# Patient Record
Sex: Male | Born: 1960 | Race: White | Hispanic: No | Marital: Married | Smoking: Former smoker
Health system: Southern US, Community
[De-identification: ages and names within clinical notes are randomized; demographics above are authoritative.]

## PROBLEM LIST (undated history)

## (undated) DIAGNOSIS — E785 Hyperlipidemia, unspecified: Secondary | ICD-10-CM

## (undated) DIAGNOSIS — I25810 Atherosclerosis of coronary artery bypass graft(s) without angina pectoris: Secondary | ICD-10-CM

## (undated) DIAGNOSIS — T7840XA Allergy, unspecified, initial encounter: Secondary | ICD-10-CM

## (undated) DIAGNOSIS — I351 Nonrheumatic aortic (valve) insufficiency: Secondary | ICD-10-CM

## (undated) DIAGNOSIS — I10 Essential (primary) hypertension: Secondary | ICD-10-CM

## (undated) DIAGNOSIS — K219 Gastro-esophageal reflux disease without esophagitis: Secondary | ICD-10-CM

## (undated) HISTORY — DX: Hyperlipidemia, unspecified: E78.5

## (undated) HISTORY — DX: Gastro-esophageal reflux disease without esophagitis: K21.9

## (undated) HISTORY — PX: COLONOSCOPY: SHX174

## (undated) HISTORY — DX: Nonrheumatic aortic (valve) insufficiency: I35.1

## (undated) HISTORY — DX: Atherosclerosis of coronary artery bypass graft(s) without angina pectoris: I25.810

## (undated) HISTORY — PX: HERNIA REPAIR: SHX51

## (undated) HISTORY — PX: TONSILLECTOMY: SUR1361

## (undated) HISTORY — DX: Allergy, unspecified, initial encounter: T78.40XA

---

## 2002-10-12 ENCOUNTER — Encounter: Payer: Self-pay | Admitting: Emergency Medicine

## 2002-10-12 ENCOUNTER — Emergency Department (HOSPITAL_COMMUNITY): Admission: AD | Admit: 2002-10-12 | Discharge: 2002-10-12 | Payer: Self-pay | Admitting: Emergency Medicine

## 2010-03-21 ENCOUNTER — Inpatient Hospital Stay (HOSPITAL_COMMUNITY)
Admission: EM | Admit: 2010-03-21 | Discharge: 2010-03-22 | Payer: Self-pay | Source: Home / Self Care | Attending: Internal Medicine | Admitting: Internal Medicine

## 2010-03-25 LAB — CK TOTAL AND CKMB (NOT AT ARMC)
CK, MB: 1 ng/mL (ref 0.3–4.0)
CK, MB: 0.9 ng/mL (ref 0.3–4.0)
Relative Index: INVALID (ref 0.0–2.5)
Relative Index: INVALID (ref 0.0–2.5)
Total CK: 63 U/L (ref 7–232)
Total CK: 55 U/L (ref 7–232)

## 2010-03-25 LAB — CARDIAC PANEL(CRET KIN+CKTOT+MB+TROPI)
CK, MB: 0.9 ng/mL (ref 0.3–4.0)
CK, MB: 0.7 ng/mL (ref 0.3–4.0)
Relative Index: INVALID (ref 0.0–2.5)
Relative Index: INVALID (ref 0.0–2.5)
Total CK: 54 U/L (ref 7–232)
Total CK: 54 U/L (ref 7–232)
Troponin I: 0.01 ng/mL (ref 0.00–0.06)
Troponin I: 0.02 ng/mL (ref 0.00–0.06)

## 2010-03-25 LAB — LIPID PANEL
Cholesterol: 218 mg/dL — ABNORMAL HIGH (ref 0–200)
HDL: 43 mg/dL (ref >39)
LDL Cholesterol: 146 mg/dL — ABNORMAL HIGH (ref 0–99)
Total CHOL/HDL Ratio: 5.1 RATIO
Triglycerides: 146 mg/dL (ref <150)
VLDL: 29 mg/dL (ref 0–40)

## 2010-03-25 LAB — POCT CARDIAC MARKERS
CKMB, poc: <1 ng/mL — ABNORMAL LOW (ref 1.0–8.0)
CKMB, poc: <1 ng/mL — ABNORMAL LOW (ref 1.0–8.0)
CKMB, poc: <1 ng/mL — ABNORMAL LOW (ref 1.0–8.0)
Myoglobin, poc: 43.9 ng/mL (ref 12–200)
Myoglobin, poc: 41.9 ng/mL (ref 12–200)
Myoglobin, poc: 41.8 ng/mL (ref 12–200)
Troponin i, poc: <0.05 ng/mL (ref 0.00–0.09)
Troponin i, poc: <0.05 ng/mL (ref 0.00–0.09)
Troponin i, poc: <0.05 ng/mL (ref 0.00–0.09)

## 2010-03-25 LAB — URINALYSIS, ROUTINE W REFLEX MICROSCOPIC
Bilirubin Urine: NEGATIVE
Hgb urine dipstick: NEGATIVE
Ketones, ur: NEGATIVE mg/dL
Nitrite: NEGATIVE
Protein, ur: NEGATIVE mg/dL
Specific Gravity, Urine: 1.022 (ref 1.005–1.030)
Urine Glucose, Fasting: NEGATIVE mg/dL
Urobilinogen, UA: 0.2 mg/dL (ref 0.0–1.0)
pH: 6 (ref 5.0–8.0)

## 2010-03-25 LAB — DIFFERENTIAL
Basophils Absolute: 0 10*3/uL (ref 0.0–0.1)
Basophils Relative: 1 % (ref 0–1)
Eosinophils Absolute: 0.3 10*3/uL (ref 0.0–0.7)
Eosinophils Relative: 4 % (ref 0–5)
Lymphocytes Relative: 24 % (ref 12–46)
Lymphs Abs: 1.8 10*3/uL (ref 0.7–4.0)
Monocytes Absolute: 0.5 10*3/uL (ref 0.1–1.0)
Monocytes Relative: 7 % (ref 3–12)
Neutro Abs: 4.9 10*3/uL (ref 1.7–7.7)
Neutrophils Relative %: 65 % (ref 43–77)

## 2010-03-25 LAB — COMPREHENSIVE METABOLIC PANEL
ALT: 19 U/L (ref 0–53)
AST: 19 U/L (ref 0–37)
Albumin: 4.1 g/dL (ref 3.5–5.2)
Alkaline Phosphatase: 70 U/L (ref 39–117)
BUN: 8 mg/dL (ref 6–23)
CO2: 27 mEq/L (ref 19–32)
Calcium: 9.3 mg/dL (ref 8.4–10.5)
Chloride: 107 mEq/L (ref 96–112)
Creatinine, Ser: 0.9 mg/dL (ref 0.4–1.5)
GFR calc Af Amer: >60 mL/min (ref >60)
GFR calc non Af Amer: >60 mL/min (ref >60)
Glucose, Bld: 94 mg/dL (ref 70–99)
Potassium: 4.2 mEq/L (ref 3.5–5.1)
Sodium: 141 mEq/L (ref 135–145)
Total Bilirubin: 0.5 mg/dL (ref 0.3–1.2)
Total Protein: 6.2 g/dL (ref 6.0–8.3)

## 2010-03-25 LAB — CBC
HCT: 43.6 % (ref 39.0–52.0)
HCT: 42.8 % (ref 39.0–52.0)
Hemoglobin: 15 g/dL (ref 13.0–17.0)
Hemoglobin: 14.3 g/dL (ref 13.0–17.0)
MCH: 30.5 pg (ref 26.0–34.0)
MCH: 30 pg (ref 26.0–34.0)
MCHC: 34.4 g/dL (ref 30.0–36.0)
MCHC: 33.4 g/dL (ref 30.0–36.0)
MCV: 88.6 fL (ref 78.0–100.0)
MCV: 89.7 fL (ref 78.0–100.0)
Platelets: 283 10*3/uL (ref 150–400)
Platelets: 319 10*3/uL (ref 150–400)
RBC: 4.92 MIL/uL (ref 4.22–5.81)
RBC: 4.77 MIL/uL (ref 4.22–5.81)
RDW: 13 % (ref 11.5–15.5)
RDW: 13.1 % (ref 11.5–15.5)
WBC: 7.6 10*3/uL (ref 4.0–10.5)
WBC: 7 10*3/uL (ref 4.0–10.5)

## 2010-03-25 LAB — HEPATIC FUNCTION PANEL
ALT: 20 U/L (ref 0–53)
AST: 19 U/L (ref 0–37)
Albumin: 3.8 g/dL (ref 3.5–5.2)
Alkaline Phosphatase: 63 U/L (ref 39–117)
Bilirubin, Direct: 0.2 mg/dL (ref 0.0–0.3)
Indirect Bilirubin: 1 mg/dL — ABNORMAL HIGH (ref 0.3–0.9)
Total Bilirubin: 1.2 mg/dL (ref 0.3–1.2)
Total Protein: 6 g/dL (ref 6.0–8.3)

## 2010-03-25 LAB — RAPID URINE DRUG SCREEN, HOSP PERFORMED
Amphetamines: NOT DETECTED
Barbiturates: NOT DETECTED
Benzodiazepines: NOT DETECTED
Cocaine: NOT DETECTED
Opiates: NOT DETECTED
Tetrahydrocannabinol: NOT DETECTED

## 2010-03-25 LAB — BASIC METABOLIC PANEL
BUN: 14 mg/dL (ref 6–23)
CO2: 25 mEq/L (ref 19–32)
Calcium: 8.9 mg/dL (ref 8.4–10.5)
Chloride: 104 mEq/L (ref 96–112)
Creatinine, Ser: 1.02 mg/dL (ref 0.4–1.5)
GFR calc Af Amer: >60 mL/min (ref >60)
GFR calc non Af Amer: >60 mL/min (ref >60)
Glucose, Bld: 110 mg/dL — ABNORMAL HIGH (ref 70–99)
Potassium: 4 mEq/L (ref 3.5–5.1)
Sodium: 137 mEq/L (ref 135–145)

## 2010-03-25 LAB — TROPONIN I: Troponin I: <0.01 ng/mL (ref 0.00–0.06)

## 2010-03-25 LAB — MAGNESIUM: Magnesium: 2.5 mg/dL (ref 1.5–2.5)

## 2010-03-25 LAB — TSH: TSH: 0.779 u[IU]/mL (ref 0.350–4.500)

## 2010-03-28 NOTE — Discharge Summary (Addendum)
  NAME:  Evan Rivera, Evan Rivera               ACCOUNT NO.:  [id number]  MEDICAL RECORD NO.:  [id number]          PATIENT TYPE:  INP  LOCATION:  4741                         FACILITY:  [facility]  PHYSICIAN:  Markies Mowatt I Anupama Piehl, MD      DATE OF BIRTH:  [birth year]  DATE OF ADMISSION:  03/21/2010 DATE OF DISCHARGE:  03/22/2010                              DISCHARGE SUMMARY   PRIMARY CARE PHYSICIAN:  Tomi Bamberger, MD  DISCHARGE DIAGNOSES: 1. Syncope felt to be possibility of orthostatic hypotension, this was     Niaspan side effect. 2. Flushing and erythema, which resolved felt to be secondary to     Niaspan. 3. Sinus bradycardia, there is no any alarming arrhythmia or monitor. 4. Hypotension, resolved. 5. Hypercholesteremia.  DISCHARGE MEDICATIONS:  Dose of Diovan will be decreased to 80 mg p.o. b.i.d. till he see his primary care physician and treatment of hypercholesteremia will be addressed by Dr. Tomi Bamberger.  HISTORY OF PRESENT ILLNESS:  Please see the admission history done by Dr. Ebony Cargo.  In summary, this is a 50 year old gentleman with no previous significant medical history other than hypertension and hypercholesteremia, for which the patient is taking Diovan and the patient was recently started on Niaspan for 3 days, on day 1 and day 2 went without event and day 3, the patient started to have flushing and erythema and suddenly felt dizzy, and he collapsed on the floor for 15 seconds.  As per wife, did not witness any seizure, and after the episode, the patient was awake, talking.  In the emergency room, the patient found to be hypotensive, responded to IV fluid.  The patient admitted for 24 hour close monitoring and observation for any pathology of cardiac event.  There were no evidence of neurological deficit to such as any stroke or any TIA.  The patient admitted to tele.  EKG was sinus rhythm.  Cardiac enzymes were cycled, which were negative, and 2-D echo pending currently.  We  will fax it to Dr. Tomi Bamberger when the report is ready.  I will contact the patient if there is any alarming echo.  The patient needs to follow up with Dr. Tomi Bamberger within 1 week, and the patient have carotid duplex done, which did not show any evidence of ICA stenosis.  Bradycardia felt to be secondary to the patient's physical activity.  As we mentioned, there is no any symptomatic bradycardia or heart rate drop as low as 50-48.     Stachia Slutsky Bosie Helper, MD     HIE/MEDQ  D:  03/22/2010  T:  03/23/2010  Job:  [id number]  cc:   Dr. Tomi Bamberger  Electronically Signed by Ebony Cargo MD on 03/28/2010 12:46:29 PM

## 2010-03-28 NOTE — H&P (Addendum)
NAME:  Evan Rivera, Evan Rivera               ACCOUNT NO.:  [id number]  MEDICAL RECORD NO.:  [id number]          PATIENT TYPE:  INP  LOCATION:  4741                         FACILITY:  [facility]  PHYSICIAN:  Jaley Yan I Koltan Portocarrero, MD      DATE OF BIRTH:  [birth year]  DATE OF ADMISSION:  03/21/2010 DATE OF DISCHARGE:                             HISTORY & PHYSICAL   PRIMARY CARE PHYSICIAN:  Candace Gallus. Ferd Glassing, MD  CHIEF COMPLAINT:  Syncope for 15 seconds.  HISTORY OF PRESENT ILLNESS:  This is a 50 year old Caucasian gentleman, retired Engineer, agricultural, who has a hypertension and hypercholesteremia.  The patient is taking Diovan for a long period of time without any complication.  He is on hypercholesteremia medications. The patient supposed to be on Niaspan.  Here on out of his other hypercholesteremic medication, he cannot recall what the name.  He start taking Niaspan for hypercholesteremia.  The patient was advised to take aspirin with Niaspan, but he did not.  First day, he has not complications and secondary without complication.  On the third day after he took the Niaspan, he awaken from sleep with flushing, skin burning all over, and he fell there is generalized erythema on his body. He went to the bathroom and he felt really dizzy.  He denies any shortness of breath.  He denies any palpitation and then he fell on the floor without any obvious injuries.  His wife who witnessed, the syncope lasted around 15 seconds and after that, the patient was wake without any neurological deficit.  He denies any complain of chest pain or shortness of breath.  Denies any nausea or vomiting.  Denies any abdominal pain.  When he was seen and evaluated by the emergency room, blood pressure was systolic 78.  He was treated with IV fluid and his blood pressure returned to 126 systolic.  In the emergency room, the patient have two cardiac set of enzyme were negative and we were asked for further evaluation.  PAST  MEDICAL HISTORY:  Significant for hypertension and hypercholesteremia.  ALLERGIES:  No known drug allergies.  MEDICATIONS:  Niaspan and Diovan and another hypercholesteremic medication, the patient run out of it.  FAMILY HISTORY:  History of hypertension, mother with a history of coronary artery disease, is status post CABG and hypercholesteremia.  SYSTEMIC REVIEW:  All 12-system were reviewed and there is no positive symptoms.  The patient denies any chest pain, any shortness of breath. Denies any neck pain.  The patient complained of headache.  Denies any nausea, vomiting or abdominal pain.  Denies hematemesis or hematuria. Denies any burning micturition.  The patient denies any lower extremity swelling.  Denies any numbness or weakness of his extremities.  Wife denies any seizure activities.  PHYSICAL EXAMINATION:  VITAL SIGNS:  Temperature 98.7.  At EMS, the initial blood pressure was 76 systolic, blood pressure currently 120/81, heart rate was 49 and currently heart rate around 60, pulse rate 82. HEENT:  Normocephalic and atraumatic.  Pupils are equal, reactive to light and accommodation.  Extraocular muscle movement within normal. NECK:  Supple.  No lymphadenopathy.  No bruits. HEART:  S1 and S2 with no added sounds. LUNGS:  Normal vesicular breathing with equal air entry. ABDOMEN:  Soft, nontender.  Bowel sounds positive. EXTREMITIES:  No lower limb edema.  Peripheral pulses intact. CNS:  The patient awake, alert and oriented x3 with no focal neurologic deficit.  LABORATORY STUDIES:  Blood workup, cardiac enzyme x2 was negative. Urinalysis negative.  Liver enzyme was normal.  BMET; sodium 137, potassium 4, chloride 104, CO2 25, glucose 110, BUN 14, creatinine 1.02 and calcium 8.9.  CBC 7.6, hemoglobin 15, hematocrit 43.6, platelets 283.  ASSESSMENT AND PLAN: 1. Syncope, possibility secondary to vasovagal syncope.  There is     allergy from Niaspan.  We have to tick what  the other medications,     it could be Simcor which is combination of simvastatin and niacin.     When he was admitted, he was hypotensive and he respond to IV     fluid.  The patient will be admitted to telemetry.  We will cycle     the patient's cardiac enzymes.  We will take the patient's     orthostatic blood pressure. 2. We will get 2-D echo and carotid duplex.  The patient did not feel     will need any MRI, there is any neurologic deficit.  The patient     was obviously hypotensive when he was admitted.  We will get total     CK and we will kept the patient in the 24-hour monitor for close     observation. 3. We will decrease the dose of Diovan.  If above is negative, the     patient will be discharge to follow up with his primary care     physician.     Maleny Candy Bosie Helper, MD     HIE/MEDQ  D:  03/21/2010  T:  03/21/2010  Job:  [id number]  cc:   Candace Gallus. Ferd Glassing, M.D.  Electronically Signed by Ebony Cargo MD on 03/28/2010 12:46:24 PM

## 2011-02-11 ENCOUNTER — Emergency Department: Payer: Self-pay | Admitting: Emergency Medicine

## 2011-08-27 ENCOUNTER — Ambulatory Visit: Payer: Self-pay | Admitting: Surgery

## 2011-08-27 LAB — CBC WITH DIFFERENTIAL/PLATELET
Basophil #: 0 10*3/uL (ref 0.0–0.1)
Basophil %: 0.5 %
Eosinophil #: 0.4 10*3/uL (ref 0.0–0.7)
Eosinophil %: 5.4 %
HCT: 42.5 % (ref 40.0–52.0)
HGB: 14.5 g/dL (ref 13.0–18.0)
Lymphocyte #: 1.2 10*3/uL (ref 1.0–3.6)
Lymphocyte %: 18.7 %
MCH: 30.5 pg (ref 26.0–34.0)
MCHC: 34.1 g/dL (ref 32.0–36.0)
MCV: 90 fL (ref 80–100)
Monocyte #: 0.5 x10 3/mm (ref 0.2–1.0)
Monocyte %: 7.4 %
Neutrophil #: 4.5 10*3/uL (ref 1.4–6.5)
Neutrophil %: 68 %
Platelet: 299 10*3/uL (ref 150–440)
RBC: 4.75 10*6/uL (ref 4.40–5.90)
RDW: 13.2 % (ref 11.5–14.5)
WBC: 6.6 10*3/uL (ref 3.8–10.6)

## 2011-08-27 LAB — BASIC METABOLIC PANEL
Anion Gap: 5 — ABNORMAL LOW (ref 7–16)
BUN: 14 mg/dL (ref 7–18)
Calcium, Total: 9.1 mg/dL (ref 8.5–10.1)
Chloride: 104 mmol/L (ref 98–107)
Co2: 30 mmol/L (ref 21–32)
Creatinine: 0.96 mg/dL (ref 0.60–1.30)
EGFR (African American): >60
EGFR (Non-African Amer.): >60
Glucose: 96 mg/dL (ref 65–99)
Osmolality: 278 (ref 275–301)
Potassium: 4.1 mmol/L (ref 3.5–5.1)
Sodium: 139 mmol/L (ref 136–145)

## 2011-09-03 ENCOUNTER — Ambulatory Visit: Payer: Self-pay | Admitting: Surgery

## 2013-10-27 ENCOUNTER — Ambulatory Visit: Payer: Self-pay | Admitting: Gastroenterology

## 2013-10-28 LAB — PATHOLOGY REPORT

## 2014-06-24 ENCOUNTER — Emergency Department (HOSPITAL_COMMUNITY)

## 2014-06-24 ENCOUNTER — Emergency Department (HOSPITAL_COMMUNITY)
Admission: EM | Admit: 2014-06-24 | Discharge: 2014-06-24 | Disposition: A | Discharge status: Home / Self Care | Attending: Emergency Medicine | Admitting: Emergency Medicine

## 2014-06-24 ENCOUNTER — Encounter (HOSPITAL_COMMUNITY): Payer: Self-pay | Admitting: Emergency Medicine

## 2014-06-24 DIAGNOSIS — I10 Essential (primary) hypertension: Secondary | ICD-10-CM | POA: Insufficient documentation

## 2014-06-24 DIAGNOSIS — R05 Cough: Secondary | ICD-10-CM

## 2014-06-24 DIAGNOSIS — R079 Chest pain, unspecified: Secondary | ICD-10-CM | POA: Diagnosis not present

## 2014-06-24 DIAGNOSIS — Z87891 Personal history of nicotine dependence: Secondary | ICD-10-CM | POA: Diagnosis not present

## 2014-06-24 DIAGNOSIS — R059 Cough, unspecified: Secondary | ICD-10-CM

## 2014-06-24 HISTORY — DX: Essential (primary) hypertension: I10

## 2014-06-24 LAB — CBC WITH DIFFERENTIAL/PLATELET
Basophils Absolute: 0.1 10*3/uL (ref 0.0–0.1)
Basophils Relative: 1 % (ref 0–1)
Eosinophils Absolute: 0.6 10*3/uL (ref 0.0–0.7)
Eosinophils Relative: 9 % — ABNORMAL HIGH (ref 0–5)
HCT: 42.2 % (ref 39.0–52.0)
Hemoglobin: 14.7 g/dL (ref 13.0–17.0)
Lymphocytes Relative: 22 % (ref 12–46)
Lymphs Abs: 1.5 10*3/uL (ref 0.7–4.0)
MCH: 30.8 pg (ref 26.0–34.0)
MCHC: 34.8 g/dL (ref 30.0–36.0)
MCV: 88.5 fL (ref 78.0–100.0)
Monocytes Absolute: 0.4 10*3/uL (ref 0.1–1.0)
Monocytes Relative: 6 % (ref 3–12)
Neutro Abs: 4.1 10*3/uL (ref 1.7–7.7)
Neutrophils Relative %: 62 % (ref 43–77)
Platelets: 328 10*3/uL (ref 150–400)
RBC: 4.77 MIL/uL (ref 4.22–5.81)
RDW: 13 % (ref 11.5–15.5)
WBC: 6.7 10*3/uL (ref 4.0–10.5)

## 2014-06-24 LAB — BASIC METABOLIC PANEL
Anion gap: 9 (ref 5–15)
BUN: 12 mg/dL (ref 6–23)
CO2: 23 mmol/L (ref 19–32)
Calcium: 9.2 mg/dL (ref 8.4–10.5)
Chloride: 105 mmol/L (ref 96–112)
Creatinine, Ser: 0.98 mg/dL (ref 0.50–1.35)
GFR calc Af Amer: >90 mL/min (ref >90)
GFR calc non Af Amer: >90 mL/min (ref >90)
Glucose, Bld: 116 mg/dL — ABNORMAL HIGH (ref 70–99)
Potassium: 4.2 mmol/L (ref 3.5–5.1)
Sodium: 137 mmol/L (ref 135–145)

## 2014-06-24 LAB — D-DIMER, QUANTITATIVE (NOT AT ARMC): D-Dimer, Quant: <0.27 ug/mL-FEU (ref 0.00–0.48)

## 2014-06-24 LAB — I-STAT TROPONIN, ED: Troponin i, poc: 0 ng/mL (ref 0.00–0.08)

## 2014-06-24 LAB — BRAIN NATRIURETIC PEPTIDE: B Natriuretic Peptide: 7.3 pg/mL (ref 0.0–100.0)

## 2014-06-24 MED ORDER — PREDNISONE 20 MG PO TABS: 60.0000 mg | ORAL_TABLET | Freq: Every day | ORAL | Status: DC

## 2014-06-24 MED ORDER — FLUTICASONE PROPIONATE 50 MCG/ACT NA SUSP: 1.0000 | Freq: Every day | NASAL | Status: DC

## 2014-06-24 NOTE — ED Notes (Signed)
Pt is in stable condition upon d/c and ambulates from ED. 

## 2014-06-24 NOTE — Discharge Instructions (Signed)
Cough, Adult  A cough is a reflex that helps clear your throat and airways. It can help heal the body or may be a reaction to an irritated airway. A cough may only last 2 or 3 weeks (acute) or may last more than 8 weeks (chronic).  CAUSES Acute cough:  Viral or bacterial infections. Chronic cough:  Infections.  Allergies.  Asthma.  Post-nasal drip.  Smoking.  Heartburn or acid reflux.  Some medicines.  Chronic lung problems (COPD).  Cancer. SYMPTOMS   Cough.  Fever.  Chest pain.  Increased breathing rate.  High-pitched whistling sound when breathing (wheezing).  Colored mucus that you cough up (sputum). TREATMENT   A bacterial cough may be treated with antibiotic medicine.  A viral cough must run its course and will not respond to antibiotics.  Your caregiver may recommend other treatments if you have a chronic cough. HOME CARE INSTRUCTIONS   Only take over-the-counter or prescription medicines for pain, discomfort, or fever as directed by your caregiver. Use cough suppressants only as directed by your caregiver.  Use a cold steam vaporizer or humidifier in your bedroom or home to help loosen secretions.  Sleep in a semi-upright position if your cough is worse at night.  Rest as needed.  Stop smoking if you smoke. SEEK IMMEDIATE MEDICAL CARE IF:   You have pus in your sputum.  Your cough starts to worsen.  You cannot control your cough with suppressants and are losing sleep.  You begin coughing up blood.  You have difficulty breathing.  You develop pain which is getting worse or is uncontrolled with medicine.  You have a fever. MAKE SURE YOU:   Understand these instructions.  Will watch your condition.  Will get help right away if you are not doing well or get worse. Document Released: 08/16/2010 Document Revised: 05/12/2011 Document Reviewed: 08/16/2010 [facility] Patient Information 2015 [place], [place]. This information is not intended  to replace advice given to you by your health care provider. Make sure you discuss any questions you have with your health care provider.     Acute Bronchospasm Acute bronchospasm caused by asthma is also referred to as an asthma attack. Bronchospasm means your air passages become narrowed. The narrowing is caused by inflammation and tightening of the muscles in the air tubes (bronchi) in your lungs. This can make it hard to breathe or cause you to wheeze and cough. CAUSES Possible triggers are:  Animal dander from the skin, hair, or feathers of animals.  Dust mites contained in house dust.  Cockroaches.  Pollen from trees or grass.  Mold.  Cigarette or tobacco smoke.  Air pollutants such as dust, household cleaners, hair sprays, aerosol sprays, paint fumes, strong chemicals, or strong odors.  Cold air or weather changes. Cold air may trigger inflammation. Winds increase molds and pollens in the air.  Strong emotions such as crying or laughing hard.  Stress.  Certain medicines such as aspirin or beta-blockers.  Sulfites in foods and drinks, such as dried fruits and wine.  Infections or inflammatory conditions, such as a flu, cold, or inflammation of the nasal membranes (rhinitis).  Gastroesophageal reflux disease (GERD). GERD is a condition where stomach acid backs up into your esophagus.  Exercise or strenuous activity. SIGNS AND SYMPTOMS   Wheezing.  Excessive coughing, particularly at night.  Chest tightness.  Shortness of breath. DIAGNOSIS  Your health care provider will ask you about your medical history and perform a physical exam. A chest X-ray or  blood testing may be performed to look for other causes of your symptoms or other conditions that may have triggered your asthma attack. TREATMENT  Treatment is aimed at reducing inflammation and opening up the airways in your lungs. Most asthma attacks are treated with inhaled medicines. These include quick  relief or rescue medicines (such as bronchodilators) and controller medicines (such as inhaled corticosteroids). These medicines are sometimes given through an inhaler or a nebulizer. Systemic steroid medicine taken by mouth or given through an IV tube also can be used to reduce the inflammation when an attack is moderate or severe. Antibiotic medicines are only used if a bacterial infection is present.  HOME CARE INSTRUCTIONS   Rest.  Drink plenty of liquids. This helps the mucus to remain thin and be easily coughed up. Only use caffeine in moderation and do not use alcohol until you have recovered from your illness.  Do not smoke. Avoid being exposed to secondhand smoke.  You play a critical role in keeping yourself in good health. Avoid exposure to things that cause you to wheeze or to have breathing problems.  Keep your medicines up-to-date and available. Carefully follow your health care provider's treatment plan.  Take your medicine exactly as prescribed.  When pollen or pollution is bad, keep windows closed and use an air conditioner or go to places with air conditioning.  Asthma requires careful medical care. See your health care provider for a follow-up as advised. If you are more than [redacted] weeks pregnant and you were prescribed any new medicines, let your obstetrician know about the visit and how you are doing. Follow up with your health care provider as directed.  After you have recovered from your asthma attack, make an appointment with your outpatient doctor to talk about ways to reduce the likelihood of future attacks. If you do not have a doctor who manages your asthma, make an appointment with a primary care doctor to discuss your asthma. SEEK IMMEDIATE MEDICAL CARE IF:   You are getting worse.  You have trouble breathing. If severe, call your local emergency services (911 in the [place]  You develop chest pain or discomfort.  You are vomiting.  You are not able to keep  fluids down.  You are coughing up yellow, green, brown, or bloody sputum.  You have a fever and your symptoms suddenly get worse.  You have trouble swallowing. MAKE SURE YOU:   Understand these instructions.  Will watch your condition.  Will get help right away if you are not doing well or get worse. Document Released: 06/04/2006 Document Revised: 02/22/2013 Document Reviewed: 08/25/2012 [facility] Patient Information 2015 [place], [place]. This information is not intended to replace advice given to you by your health care provider. Make sure you discuss any questions you have with your health care provider.

## 2014-06-24 NOTE — ED Notes (Signed)
Pt c/o cough that has been going on x 45 days. Stated that he has been seen by [facility] and his PCP and both given a prescription for antibiotics each time with no relief. Pt stated that he has increased SOB when walking up steps. Denies coughing up any mucous and stated that he feels rattling in his chest.

## 2014-06-24 NOTE — ED Provider Notes (Signed)
TIME SEEN: 10:15 AM  CHIEF COMPLAINT: Cough  HPI: Pt is a 54 y.o. male with history of hypertension who is not currently on any medications who presents to the emergency department with a cough for the past 1-2 months. States that it is productive with clear sputum. He has been seen at [facility] urgent care and was put on antibiotics and then again by his primary care physician Delia Chimes. States that both round of Anaprox will improve his symptoms but never completely resolve them. Denies he's ever had fever. He does have diffuse chest tightness that gets better after coughing. Denies any shortness of breath. Was a previous smoker but quit 23 years ago. 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 No calf tenderness or swelling. No history of recent hospitalization, fracture, surgery, trauma. No history of asthma or COPD.  States at times the cough will get worse after eating. Denies any burning in his chest, better or sour taste in his mouth. States he is on an over-the-counter PPI at this time because his primary care physician thought that this could be related to GERD. Also states he has had some postnasal drainage.  ROS: See HPI Constitutional: no fever  Eyes: no drainage  ENT: no runny nose   Cardiovascular:   chest pain  Resp: no SOB  GI: no vomiting GU: no dysuria Integumentary: no rash  Allergy: no hives  Musculoskeletal: no leg swelling  Neurological: no slurred speech ROS otherwise negative  PAST MEDICAL HISTORY/PAST SURGICAL HISTORY:  Past Medical History  Diagnosis Date  . Hypertension     MEDICATIONS:  Prior to Admission medications   Not on File    ALLERGIES:  Allergies not on file  SOCIAL HISTORY:  History  Substance Use Topics  . Smoking status: Former Smoker    Types: Cigarettes    Quit date: 06/23/1989  . Smokeless tobacco: Not on file  . Alcohol Use: No    FAMILY HISTORY: No family  history on file.  EXAM: BP 130/97 mmHg  Pulse 80  Temp(Src) 98.4 F (36.9 C) (Oral)  Resp 18  Ht 5\' 5"  (1.651 m)  Wt 165 lb (74.844 kg)  BMI 27.46 kg/m2  SpO2 96% CONSTITUTIONAL: Alert and oriented and responds appropriately to questions. Well-appearing; well-nourished HEAD: Normocephalic EYES: Conjunctivae clear, PERRL ENT: normal nose; no rhinorrhea; moist mucous membranes; pharynx without lesions noted; no obvious drainage noted in the posterior oropharynx, no uvular deviation, no trismus or drooling, normal phonation, no stridor  NECK: Supple, no meningismus, no LAD  CARD: RRR; S1 and S2 appreciated; no murmurs, no clicks, no rubs, no gallops RESP: Normal chest excursion without splinting or tachypnea; breath sounds clear and equal bilaterally; no wheezes, no rhonchi, no rales,  no hypoxia or respiratory distress the patient has continual coughing while I am in the room ABD/GI: Normal bowel sounds; non-distended; soft, non-tender, no rebound, no guarding BACK:  The back appears normal and is non-tender to palpation, there is no CVA tenderness EXT: Normal ROM in all joints; non-tender to palpation; no edema; normal capillary refill; no cyanosis, no calf tenderness or swelling     SKIN: Normal color for age and race; warm NEURO: Moves all extremities equally PSYCH: The patient's mood and manner are appropriate. Grooming and personal hygiene are appropriate.  MEDICAL DECISION MAKING:  Patient here with chronic cough. Discussed with patient that this could be related to  acid reflux, postnasal drip, bronchospasm. I have lower suspicion for pneumonia given he is afebrile, nontoxic and has had 2 rounds of antibiotics without relief. 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 Will obtain d-dimer. Will also obtain chest x-ray. EKG shows no ischemic changes.  ED PROGRESS:  Patient's labs are unremarkable including  negative troponin, normal BNP and negative d-dimer. Chest x-ray is clear. Discussed with patient that this may be related to postnasal drip or bronchospasm. He is oriented PPI. Will place him on Flonase and a steroid burst. We'll also provide Tessalon Perles for symptom control. Discussed with patient if this does not improve his symptoms I recommend he follow up with his primary care provider. He verbalizes understanding and is comfortable with plan. Discussed return precautions.     EKG Interpretation  Date/Time:  Saturday June 24 2014 10:24:49 EDT Ventricular Rate:  76 PR Interval:  144 QRS Duration: 84 QT Interval:  353 QTC Calculation: 397 R Axis:   70 Text Interpretation:  Sinus rhythm No significant change since last tracing Confirmed by WARD,  DO, KRISTEN [id number]) on 06/24/2014 10:41:45 AM         [place], DO 06/24/14 1212

## 2014-06-25 NOTE — Op Note (Signed)
PATIENT NAME:  GARAN, FRAPPIER MR#:  [id number] DATE OF BIRTH:  [birth year]  DATE OF PROCEDURE:  09/03/2011  PREOPERATIVE DIAGNOSIS: Ventral hernia.   POSTOPERATIVE DIAGNOSIS: Ventral hernia.   PROCEDURE: Ventral hernia repair.   SURGEON: Rochel Brome, MD   ANESTHESIA: General.   INDICATIONS: This 54 year old male has recently developed bulging in the epigastrium just above the umbilicus and had a demonstrable small ventral hernia, and repair was recommended for definitive treatment.   DESCRIPTION OF PROCEDURE: The patient was placed on the operating table in the supine position under general anesthesia. The abdomen was prepared with ChloraPrep and draped in a sterile manner. A short longitudinal incision was made in the epigastrium just above the umbilicus and carried down through subcutaneous tissues to encounter a ventral hernia sac which was approximately 2.5 cm in dimension and was dissected free from surrounding structures down into the fascial ring defect. The ring defect itself was approximately 6 mm, and with manipulation the hernia could be reduced. The sac was dissected free from the fascial ring defect. The fascia itself appeared to have satisfactory integrity for holding sutures, and the repair was carried out with a 0 Surgilon figure-of-eight suture. The repair looked good. It is noted that during the course of the procedure a few small bleeding points were cauterized. Hemostasis was subsequently intact. The subcutaneous tissues were closed with a 5-0 Monocryl pursestring suture. Next, the skin was closed with running 5-0 Monocryl subcuticular suture and Dermabond. The patient tolerated surgery satisfactorily and was then prepared for transfer to the recovery room.  ____________________________ Lenna Sciara. Rochel Brome, MD jws:cbb D: 09/03/2011 12:26:10 ET T: 09/03/2011 13:16:02 ET JOB#: [id number] Loreli Dollar MD ELECTRONICALLY SIGNED 09/04/2011 12:47

## 2015-02-17 ENCOUNTER — Encounter: Payer: Self-pay | Admitting: *Deleted

## 2015-02-17 ENCOUNTER — Emergency Department
Admission: EM | Admit: 2015-02-17 | Discharge: 2015-02-17 | Disposition: A | Discharge status: Home / Self Care | Attending: Emergency Medicine | Admitting: Emergency Medicine

## 2015-02-17 DIAGNOSIS — Z7951 Long term (current) use of inhaled steroids: Secondary | ICD-10-CM | POA: Diagnosis not present

## 2015-02-17 DIAGNOSIS — L03114 Cellulitis of left upper limb: Secondary | ICD-10-CM | POA: Diagnosis not present

## 2015-02-17 DIAGNOSIS — Z79899 Other long term (current) drug therapy: Secondary | ICD-10-CM | POA: Diagnosis not present

## 2015-02-17 DIAGNOSIS — Z87891 Personal history of nicotine dependence: Secondary | ICD-10-CM | POA: Insufficient documentation

## 2015-02-17 DIAGNOSIS — I10 Essential (primary) hypertension: Secondary | ICD-10-CM | POA: Insufficient documentation

## 2015-02-17 DIAGNOSIS — R2232 Localized swelling, mass and lump, left upper limb: Secondary | ICD-10-CM | POA: Diagnosis present

## 2015-02-17 DIAGNOSIS — Z7952 Long term (current) use of systemic steroids: Secondary | ICD-10-CM | POA: Diagnosis not present

## 2015-02-17 LAB — BASIC METABOLIC PANEL
Anion gap: 8 (ref 5–15)
BUN: 18 mg/dL (ref 6–20)
CO2: 28 mmol/L (ref 22–32)
Calcium: 9.9 mg/dL (ref 8.9–10.3)
Chloride: 101 mmol/L (ref 101–111)
Creatinine, Ser: 0.82 mg/dL (ref 0.61–1.24)
GFR calc Af Amer: >60 mL/min (ref >60)
GFR calc non Af Amer: >60 mL/min (ref >60)
Glucose, Bld: 91 mg/dL (ref 65–99)
Potassium: 4 mmol/L (ref 3.5–5.1)
Sodium: 137 mmol/L (ref 135–145)

## 2015-02-17 LAB — CBC
HCT: 46.8 % (ref 40.0–52.0)
Hemoglobin: 15.6 g/dL (ref 13.0–18.0)
MCH: 30.1 pg (ref 26.0–34.0)
MCHC: 33.4 g/dL (ref 32.0–36.0)
MCV: 90.2 fL (ref 80.0–100.0)
Platelets: 457 10*3/uL — ABNORMAL HIGH (ref 150–440)
RBC: 5.19 MIL/uL (ref 4.40–5.90)
RDW: 12.6 % (ref 11.5–14.5)
WBC: 7 10*3/uL (ref 3.8–10.6)

## 2015-02-17 MED ORDER — CLINDAMYCIN PHOSPHATE 600 MG/50ML IV SOLN
600.0000 mg | Freq: Once | INTRAVENOUS | Status: AC
  Administered 2015-02-17: 600 mg via INTRAVENOUS

## 2015-02-17 MED ORDER — CLINDAMYCIN HCL 300 MG PO CAPS: 300.0000 mg | ORAL_CAPSULE | Freq: Three times a day (TID) | ORAL | Status: DC

## 2015-02-17 NOTE — ED Notes (Signed)
MD at bedside. 

## 2015-02-17 NOTE — ED Notes (Signed)
Pt reports a knot formed on left hand Tuesday. Hand appears to have a bite mark on it but pt denies knowledge of what could have caused bite. Hand is swollen and warm to the touch. Pt reports tenderness at the center of bit and itchiness all over hand. Pt is able to move hand without pain. Pt denies sweats, cold chills or fevers to this point.

## 2015-02-17 NOTE — ED Provider Notes (Signed)
[facility] Emergency Department Provider Note  ____________________________________________  Time seen: Approximately 349 AM  I have reviewed the triage vital signs and the nursing notes.   HISTORY  Chief Complaint Cellulitis    HPI Evan Rivera is a 54 y.o. male who comes into the hospital today with left hand swelling. The patient reports that he noticed a knot on his left hand on Tuesday and it has been swelling and spreading since then. The patient reports that initially it was a little itchy but it is now feeling like pressure. The patient denies any fever but reports that his hand feels warm to the touch. He has never had anything like this in the past. He reports that his pain is a 2 out of 10 in intensity and it feels tight. The patient was concerned but is unsure if he had a bug bite that caused the symptoms. The patient is here for treatment and evaluation   Past Medical History  Diagnosis Date  . Hypertension     There are no active problems to display for this patient.   Past Surgical History  Procedure Laterality Date  . Hernia repair      umbilical   . Tonsillectomy      Current Outpatient Rx  Name  Route  Sig  Dispense  Refill  . clindamycin (CLEOCIN) 300 MG capsule   Oral   Take 1 capsule (300 mg total) by mouth 3 (three) times daily.   30 capsule   0   . fluticasone (FLONASE) 50 MCG/ACT nasal spray   Each Nare   Place 1 spray into both nostrils daily.   16 g   2   . guaiFENesin (MUCINEX) 600 MG 12 hr tablet   Oral   Take 1,200 mg by mouth 2 (two) times daily.         Marland Kitchen OVER THE COUNTER MEDICATION   Oral   Take 1 tablet by mouth daily. OTC heartburn         . predniSONE (DELTASONE) 20 MG tablet   Oral   Take 3 tablets (60 mg total) by mouth daily.   15 tablet   0   . Propoxyphene HCl (DARVON PO)   Oral   Take 300 mg by mouth 1 day or 1 dose.           Allergies Statins  History reviewed. No  pertinent family history.  Social History Social History  Substance Use Topics  . Smoking status: Former Smoker    Types: Cigarettes    Quit date: 06/23/1989  . Smokeless tobacco: None  . Alcohol Use: No    Review of Systems Constitutional: No fever/chills Eyes: No visual changes. ENT: No sore throat. Cardiovascular: Denies chest pain. Respiratory: Denies shortness of breath. Gastrointestinal: No abdominal pain.  No nausea, no vomiting.  No diarrhea.  No constipation. Genitourinary: Negative for dysuria. Musculoskeletal: left hand swelling and redness Skin: Negative for rash. Neurological: Negative for headaches, focal weakness or numbness.  10-point ROS otherwise negative.  ____________________________________________   PHYSICAL EXAM:  VITAL SIGNS: ED Triage Vitals  Enc Vitals Group     BP 02/17/15 0123 149/102 mmHg     Pulse Rate 02/17/15 0123 71     Resp 02/17/15 0123 16     Temp 02/17/15 0123 97.6 F (36.4 C)     Temp Source 02/17/15 0123 Oral     SpO2 02/17/15 0123 98 %     Weight 02/17/15 0123 168  lb (76.204 kg)     Height 02/17/15 0123 5\' 5"  (1.651 m)     Head Cir --      Peak Flow --      Pain Score 02/17/15 0129 4     Pain Loc --      Pain Edu? --      Excl. in [place]? --     Constitutional: Alert and oriented. Well appearing and in mild distress. Eyes: Conjunctivae are normal. PERRL. EOMI. Head: Atraumatic. Nose: No congestion/rhinnorhea. Mouth/Throat: Mucous membranes are moist.  Oropharynx non-erythematous. Cardiovascular: Normal rate, regular rhythm. Grossly normal heart sounds.  Good peripheral circulation. Respiratory: Normal respiratory effort.  No retractions. Lungs CTAB. Gastrointestinal: Soft and nontender. No distention. Positive bowel sounds Musculoskeletal: Left hand swelling with mild erythema and a punctate lesion to the ulnar portion of the dorsal hand which is tender to palpation with no fluctuance. Neurologic:  Normal speech and  language. Skin:  Skin is warm, dry and intact. Marland Kitchen Psychiatric: Mood and affect are normal.   ____________________________________________   LABS (all labs ordered are listed, but only abnormal results are displayed)  Labs Reviewed  CBC - Abnormal; Notable for the following:    Platelets 457 (*)    All other components within normal limits  CULTURE, BLOOD (ROUTINE X 2)  CULTURE, BLOOD (ROUTINE X 2)  BASIC METABOLIC PANEL   ____________________________________________  EKG  None ____________________________________________  RADIOLOGY  None ____________________________________________   PROCEDURES  Procedure(s) performed: None  Critical Care performed: No  ____________________________________________   INITIAL IMPRESSION / ASSESSMENT AND PLAN / ED COURSE  Pertinent labs & imaging results that were available during my care of the patient were reviewed by me and considered in my medical decision making (see chart for details).  This is a 54 year old male who comes in today with some right hand redness and swelling with some mild discomfort. The patient reports that he thinks he may have gotten a bug bite but has been spreading and getting more red. It appears as though the patient has some cellulitis. I did do some blood work which did not show an elevated white blood cell count or have any further abnormalities. I did give the patient a dose of clindamycin IV and will give him some medication orally. The patient should return if the wound is worsening or spreading. ____________________________________________   FINAL CLINICAL IMPRESSION(S) / ED DIAGNOSES  Final diagnoses:  Cellulitis of left upper extremity      Loney Hering, MD 02/17/15 [phone]

## 2015-02-17 NOTE — Discharge Instructions (Signed)

## 2015-02-17 NOTE — ED Notes (Signed)
Patient with redness and swelling to left hand. Patient reports that started as a small area on his hand. Patient reports that it has become worse. Patient's hand warm to the touch.

## 2015-02-17 NOTE — ED Notes (Signed)
Marked red/swollen area on left hand with skin marker.  Asked patient to return if he noticed this area growing or increasing pain/color change/necrosis in the area of the injury

## 2015-02-23 LAB — CULTURE, BLOOD (ROUTINE X 2)
Culture: NO GROWTH
Culture: NO GROWTH

## 2015-08-09 ENCOUNTER — Encounter: Payer: Self-pay | Admitting: Gastroenterology

## 2015-10-01 ENCOUNTER — Encounter: Payer: Self-pay | Admitting: Gastroenterology

## 2015-10-01 ENCOUNTER — Encounter (INDEPENDENT_AMBULATORY_CARE_PROVIDER_SITE_OTHER): Payer: Self-pay

## 2015-10-01 ENCOUNTER — Ambulatory Visit (AMBULATORY_SURGERY_CENTER): Payer: Self-pay | Admitting: *Deleted

## 2015-10-01 VITALS — Ht 65.0 in | Wt 161.0 lb

## 2015-10-01 DIAGNOSIS — Z1211 Encounter for screening for malignant neoplasm of colon: Secondary | ICD-10-CM

## 2015-10-01 MED ORDER — NA SULFATE-K SULFATE-MG SULF 17.5-3.13-1.6 GM/177ML PO SOLN: 1.0000 | Freq: Once | ORAL | 0 refills | Status: AC

## 2015-10-01 NOTE — Progress Notes (Signed)
No egg or soy allergy known to patient - pt thinks with allergy testing had mild allergy- eats soy with no  issues  No issues with past sedation with any surgeries  or procedures, no intubation problems  No diet pills per patient No home 02 use per patient  No blood thinners per patient  Pt denies issues with constipation

## 2015-10-15 ENCOUNTER — Ambulatory Visit (AMBULATORY_SURGERY_CENTER): Admitting: Gastroenterology

## 2015-10-15 ENCOUNTER — Encounter: Payer: Self-pay | Admitting: Gastroenterology

## 2015-10-15 VITALS — BP 121/83 | HR 53 | Temp 98.4°F | Resp 10 | Ht 65.0 in | Wt 161.0 lb

## 2015-10-15 DIAGNOSIS — Z1211 Encounter for screening for malignant neoplasm of colon: Secondary | ICD-10-CM

## 2015-10-15 DIAGNOSIS — K635 Polyp of colon: Secondary | ICD-10-CM | POA: Diagnosis not present

## 2015-10-15 DIAGNOSIS — D125 Benign neoplasm of sigmoid colon: Secondary | ICD-10-CM

## 2015-10-15 MED ORDER — SODIUM CHLORIDE 0.9 % IV SOLN
500.0000 mL | INTRAVENOUS | Status: DC
Start: 2015-10-15 — End: 2016-09-22

## 2015-10-15 NOTE — Progress Notes (Signed)
Called to room to assist during endoscopic procedure.  Patient ID and intended procedure confirmed with present staff. Received instructions for my participation in the procedure from the performing physician.  

## 2015-10-15 NOTE — Op Note (Signed)
[place] Patient Name: Evan Rivera Procedure Date: 10/15/2015 9:00 AM MRN: [id number] Endoscopist: Mauri Pole , MD Age: 55 Referring MD:  Date of Birth: 08-Oct-1960 Gender: Male Account #: [id number] Procedure:                Colonoscopy Indications:              Screening for colorectal malignant neoplasm, This                            is the patient's first colonoscopy Medicines:                Monitored Anesthesia Care Procedure:                Pre-Anesthesia Assessment:                           - Prior to the procedure, a History and Physical                            was performed, and patient medications and                            allergies were reviewed. The patient's tolerance of                            previous anesthesia was also reviewed. The risks                            and benefits of the procedure and the sedation                            options and risks were discussed with the patient.                            All questions were answered, and informed consent                            was obtained. Prior Anticoagulants: The patient has                            taken no previous anticoagulant or antiplatelet                            agents. ASA Grade Assessment: II - A patient with                            mild systemic disease. After reviewing the risks                            and benefits, the patient was deemed in                            satisfactory condition to undergo the procedure.  After obtaining informed consent, the colonoscope                            was passed under direct vision. Throughout the                            procedure, the patient's blood pressure, pulse, and                            oxygen saturations were monitored continuously. The                            Model CF-HQ190L [id number]) scope was introduced                            through the anus and  advanced to the the terminal                            ileum, with identification of the appendiceal                            orifice and IC valve. The colonoscopy was performed                            without difficulty. The patient tolerated the                            procedure well. The quality of the bowel                            preparation was excellent. The terminal ileum,                            ileocecal valve, appendiceal orifice, and rectum                            were photographed. Scope In: 9:15:15 AM Scope Out: 9:25:31 AM Scope Withdrawal Time: 0 hours 8 minutes 23 seconds  Total Procedure Duration: 0 hours 10 minutes 16 seconds  Findings:                 The perianal and digital rectal examinations were                            normal.                           A 2 mm polyp was found in the sigmoid colon. The                            polyp was sessile. The polyp was removed with a                            cold biopsy forceps. Resection and retrieval were  complete.                           A few small-mouthed diverticula were found in the                            sigmoid colon.                           Non-bleeding internal hemorrhoids were found during                            retroflexion. The hemorrhoids were medium-sized.                           The exam was otherwise without abnormality. Complications:            No immediate complications. Estimated Blood Loss:     Estimated blood loss was minimal. Impression:               - One 2 mm polyp in the sigmoid colon, removed with                            a cold biopsy forceps. Resected and retrieved.                           - Diverticulosis in the sigmoid colon.                           - Non-bleeding internal hemorrhoids.                           - The examination was otherwise normal. Recommendation:           - Patient has a contact number available for                             emergencies. The signs and symptoms of potential                            delayed complications were discussed with the                            patient. Return to normal activities tomorrow.                            Written discharge instructions were provided to the                            patient.                           - Resume previous diet.                           - Continue present medications.                           -  Await pathology results.                           - Repeat colonoscopy in 5-10 years for surveillance                            based on pathology results. Mauri Pole, MD 10/15/2015 9:31:27 AM This report has been signed electronically.

## 2015-10-15 NOTE — Patient Instructions (Signed)
Colon polyps removed, handouts given on diverticulosis, polyps,hemorrhoids. Result letter in your mail in 2-3 weeks. Resume current medications. Call us with any questions or concerns. Thank you!    YOU HAD AN ENDOSCOPIC PROCEDURE TODAY AT [facility]:   Refer to the procedure report that was given to you for any specific questions about what was found during the examination.  If the procedure report does not answer your questions, please call your gastroenterologist to clarify.  If you requested that your care partner not be given the details of your procedure findings, then the procedure report has been included in a sealed envelope for you to review at your convenience later.  YOU SHOULD EXPECT: Some feelings of bloating in the abdomen. Passage of more gas than usual.  Walking can help get rid of the air that was put into your GI tract during the procedure and reduce the bloating. If you had a lower endoscopy (such as a colonoscopy or flexible sigmoidoscopy) you may notice spotting of blood in your stool or on the toilet paper. If you underwent a bowel prep for your procedure, you may not have a normal bowel movement for a few days.  Please Note:  You might notice some irritation and congestion in your nose or some drainage.  This is from the oxygen used during your procedure.  There is no need for concern and it should clear up in a day or so.  SYMPTOMS TO REPORT IMMEDIATELY:   Following lower endoscopy (colonoscopy or flexible sigmoidoscopy):  Excessive amounts of blood in the stool  Significant tenderness or worsening of abdominal pains  Swelling of the abdomen that is new, acute  Fever of 100F or higher   For urgent or emergent issues, a gastroenterologist can be reached at any hour by calling [phone].   DIET: Your first meal following the procedure should be a small meal and then it is ok to progress to your normal diet. Heavy or fried foods are harder to  digest and may make you feel nauseous or bloated.  Likewise, meals heavy in dairy and vegetables can increase bloating.  Drink plenty of fluids but you should avoid alcoholic beverages for 24 hours.  ACTIVITY:  You should plan to take it easy for the rest of today and you should NOT DRIVE or use heavy machinery until tomorrow (because of the sedation medicines used during the test).    FOLLOW UP: Our staff will call the number listed on your records the next business day following your procedure to check on you and address any questions or concerns that you may have regarding the information given to you following your procedure. If we do not reach you, we will leave a message.  However, if you are feeling well and you are not experiencing any problems, there is no need to return our call.  We will assume that you have returned to your regular daily activities without incident.  If any biopsies were taken you will be contacted by phone or by letter within the next 1-3 weeks.  Please call us at [phone] if you have not heard about the biopsies in 3 weeks.    SIGNATURES/CONFIDENTIALITY: You and/or your care partner have signed paperwork which will be entered into your electronic medical record.  These signatures attest to the fact that that the information above on your After Visit Summary has been reviewed and is understood.  Full responsibility of the confidentiality of this discharge information  lies with you and/or your care-partner.

## 2015-10-15 NOTE — Progress Notes (Signed)
Report to PACU, RN, vss, BBS= Clear.  

## 2015-10-16 ENCOUNTER — Telehealth: Payer: Self-pay | Admitting: *Deleted

## 2015-10-16 NOTE — Telephone Encounter (Signed)
  Follow up Call-  Call back number 10/15/2015  Post procedure Call Back phone  # [phone]  Permission to leave phone message Yes  Some recent data might be hidden     Patient questions:  Do you have a fever, pain , or abdominal swelling? No. Pain Score  0 *  Have you tolerated food without any problems? Yes.    Have you been able to return to your normal activities? Yes.    Do you have any questions about your discharge instructions: Diet   No. Medications  No. Follow up visit  No.  Do you have questions or concerns about your Care? No.  Actions: * If pain score is 4 or above: No action needed, pain <4.

## 2015-10-23 ENCOUNTER — Encounter: Payer: Self-pay | Admitting: Gastroenterology

## 2016-09-22 ENCOUNTER — Encounter: Payer: Self-pay | Admitting: Primary Care

## 2016-09-22 ENCOUNTER — Ambulatory Visit (INDEPENDENT_AMBULATORY_CARE_PROVIDER_SITE_OTHER): Admitting: Primary Care

## 2016-09-22 DIAGNOSIS — I10 Essential (primary) hypertension: Secondary | ICD-10-CM | POA: Insufficient documentation

## 2016-09-22 DIAGNOSIS — E785 Hyperlipidemia, unspecified: Secondary | ICD-10-CM | POA: Insufficient documentation

## 2016-09-22 DIAGNOSIS — K219 Gastro-esophageal reflux disease without esophagitis: Secondary | ICD-10-CM | POA: Insufficient documentation

## 2016-09-22 MED ORDER — RANITIDINE HCL 150 MG PO TABS: 150.0000 mg | ORAL_TABLET | Freq: Every day | ORAL | 0 refills | Status: DC

## 2016-09-22 MED ORDER — OMEPRAZOLE 40 MG PO CPDR: 40.0000 mg | DELAYED_RELEASE_CAPSULE | Freq: Every day | ORAL | 0 refills | Status: DC

## 2016-09-22 NOTE — Assessment & Plan Note (Signed)
History of, near anaphylactic reaction to statins in the past. Will check lipids at upcoming visit. Consider Zetia vs other agents if necessary.

## 2016-09-22 NOTE — Assessment & Plan Note (Signed)
Managed on valsartan 320 mg once daily. He will call the manufacturer to see if this is on the recall list. He politely declined the offer for [place] to change his valsartan today.

## 2016-09-22 NOTE — Progress Notes (Signed)
Subjective:    Patient ID: Evan Rivera, male    DOB: [birth year], 56 y.o.   MRN: [id number]  HPI  Evan Rivera is a 56 year old male who presents today to establish care and discuss the problems mentioned below. Will obtain old records.  1) Essential Hypertension: Currently managed on valsartan 320 mg. He gets this prescription through the mail order. His BP in the office 126/80. He's not checked to see if his valsartan is on the recall list.  2) GERD: Experienced throat pain after he burned his throat after eating hot food 2-3 years ago. He would experience throat pain with exertion mostly. He underwent endoscopy in 2015 which showed irritation from GERD, otherwise unremarkable. He started taking two different medications for GERD and experienced resolve in pain.   He's currently taking omeprazole 20 mg most days of the week. 2-3 weeks ago he noticed the burning return and has been taking omeprazole 40 mg with some improvement. The pain feels the same as it did in 2015. He's been eating tomatoes, vinegar, mints, and other acid foods recently. He eats a full bag of spearmints once weekly in one sitting.  Symptoms include esophageal burning from the epigastric region to the upper throat. He denies hemoptysis, unexplained weight loss.  3) Hyperlipidemia: Previously initiated on a statin. Took the statin and within several hours he woke up with an allergic reaction. Reaction included syncope, rash, shortness of breath. This sent him to the hospital for which he was admitted for an overnight stay.  Review of Systems  Constitutional: Negative for unexpected weight change.  HENT: Negative for congestion.   Eyes: Negative for visual disturbance.  Respiratory: Negative for shortness of breath.   Cardiovascular: Negative for chest pain.  Gastrointestinal:       Gerd  Neurological: Negative for dizziness and headaches.       Past Medical History:  Diagnosis Date  . Allergy    seasonal  .  GERD (gastroesophageal reflux disease)   . Hyperlipidemia    no meds   . Hypertension      Social History   Social History  . Marital status: Married    Spouse name: N/A  . Number of children: N/A  . Years of education: N/A   Occupational History  . Not on file.   Social History Main Topics  . Smoking status: Former Smoker    Types: Cigarettes    Quit date: 06/23/1989  . Smokeless tobacco: Former Systems developer  . Alcohol use No  . Drug use: No  . Sexual activity: Not on file   Other Topics Concern  . Not on file   Social History Narrative   Married.   2 children.   Works as a Administrator.   Enjoys kayaking.     Past Surgical History:  Procedure Laterality Date  . COLONOSCOPY     > 10 yeras ago- normal per pt.   Marland Kitchen HERNIA REPAIR     umbilical   . TONSILLECTOMY      Family History  Problem Relation Age of Onset  . Prostate cancer Father        late 57's  . Hyperlipidemia Father   . Hypertension Father   . Heart disease Mother   . Colon cancer Neg Hx   . Colon polyps Neg Hx   . Esophageal cancer Neg Hx   . Rectal cancer Neg Hx   . Stomach cancer Neg Hx     Allergies  Allergen Reactions  . Statins Anaphylaxis    Current Outpatient Prescriptions on File Prior to Visit  Medication Sig Dispense Refill  . valsartan (DIOVAN) 320 MG tablet Take 320 mg by mouth daily.      No current facility-administered medications on file prior to visit.     BP 126/80   Pulse 71   Temp 98.1 F (36.7 C) (Oral)   Ht 5' 3.5" (1.613 m)   Wt 165 lb 12.8 oz (75.2 kg)   SpO2 96%   BMI 28.91 kg/m    Objective:   Physical Exam  Constitutional: He appears well-nourished.  Neck: Neck supple. No thyromegaly present.  Cardiovascular: Normal rate and regular rhythm.   Pulmonary/Chest: Effort normal and breath sounds normal.  Skin: Skin is warm and dry.          Assessment & Plan:

## 2016-09-22 NOTE — Assessment & Plan Note (Signed)
Recent flare. Likely from recent diet consisting of tomatoes, onions, vinegar, spearmints. Discussed to refrain from consumption of these. Rx for omeprazole 40 mg and Zantac 150 mg sent to pharmacy for 4 week term. Once symptoms resolve would recommend he return to omeprazole 20 mg once daily. He will update if no improvement. List of trigger foods for GERD provided today.

## 2016-09-22 NOTE — Patient Instructions (Signed)
Start omeprazole 40 mg for acid reflux. Take 1 tablet by mouth every evening. Take this for at least four weeks.  Start ranitidine (Zantac) 150 mg for acid reflux. Take 1 tablet by mouth ever morning. Take this at least for four weeks.  Please notify us if no improvement in 1-2 weeks.   Take a look at the list of trigger foods below.  Please notify me if your valsartan medication is on the recall list.  Please schedule a physical with me sometime this year. You may also schedule a lab only appointment 3-4 days prior. We will discuss your lab results in detail during your physical.  It was a pleasure to meet you today! Please don't hesitate to call me with any questions. Welcome to [facility]!    Food Choices for Gastroesophageal Reflux Disease, Adult When you have gastroesophageal reflux disease (GERD), the foods you eat and your eating habits are very important. Choosing the right foods can help ease your discomfort. What guidelines do I need to follow?  Choose fruits, vegetables, whole grains, and low-fat dairy products.  Choose low-fat meat, fish, and poultry.  Limit fats such as oils, salad dressings, butter, nuts, and avocado.  Keep a food diary. This helps you identify foods that cause symptoms.  Avoid foods that cause symptoms. These may be different for everyone.  Eat small meals often instead of 3 large meals a day.  Eat your meals slowly, in a place where you are relaxed.  Limit fried foods.  Cook foods using methods other than frying.  Avoid drinking alcohol.  Avoid drinking large amounts of liquids with your meals.  Avoid bending over or lying down until 2-3 hours after eating. What foods are not recommended? These are some foods and drinks that may make your symptoms worse: Vegetables Tomatoes. Tomato juice. Tomato and spaghetti sauce. Chili peppers. Onion and garlic. Horseradish. Fruits Oranges, grapefruit, and lemon (fruit and juice). Meats High-fat  meats, fish, and poultry. This includes hot dogs, ribs, ham, sausage, salami, and bacon. Dairy Whole milk and chocolate milk. Sour cream. Cream. Butter. Ice cream. Cream cheese. Drinks Coffee and tea. Bubbly (carbonated) drinks or energy drinks. Condiments Hot sauce. Barbecue sauce. Sweets/Desserts Chocolate and cocoa. Donuts. Peppermint and spearmint. Fats and Oils High-fat foods. This includes [place] fries and potato chips. Other Vinegar. Strong spices. This includes black pepper, white pepper, red pepper, cayenne, curry powder, cloves, ginger, and chili powder. The items listed above may not be a complete list of foods and drinks to avoid. Contact your dietitian for more information. This information is not intended to replace advice given to you by your health care provider. Make sure you discuss any questions you have with your health care provider. Document Released: 08/19/2011 Document Revised: 07/26/2015 Document Reviewed: 12/22/2012 Elsevier Interactive Patient Education  2017 [employer].

## 2019-02-18 ENCOUNTER — Ambulatory Visit (INDEPENDENT_AMBULATORY_CARE_PROVIDER_SITE_OTHER): Admitting: Family Medicine

## 2019-02-18 ENCOUNTER — Other Ambulatory Visit: Payer: Self-pay

## 2019-02-18 ENCOUNTER — Encounter: Payer: Self-pay | Admitting: Family Medicine

## 2019-02-18 VITALS — BP 154/100 | HR 71 | Temp 97.4°F | Ht 63.5 in | Wt 164.2 lb

## 2019-02-18 DIAGNOSIS — I10 Essential (primary) hypertension: Secondary | ICD-10-CM | POA: Diagnosis not present

## 2019-02-18 LAB — BASIC METABOLIC PANEL
BUN: 15 mg/dL (ref 6–23)
CO2: 28 mEq/L (ref 19–32)
Calcium: 10 mg/dL (ref 8.4–10.5)
Chloride: 100 mEq/L (ref 96–112)
Creatinine, Ser: 0.84 mg/dL (ref 0.40–1.50)
GFR: 93.29 mL/min (ref >60.00)
Glucose, Bld: 93 mg/dL (ref 70–99)
Potassium: 4.3 mEq/L (ref 3.5–5.1)
Sodium: 136 mEq/L (ref 135–145)

## 2019-02-18 MED ORDER — VALSARTAN 80 MG PO TABS: 80.0000 mg | ORAL_TABLET | Freq: Every day | ORAL | 1 refills | Status: DC

## 2019-02-18 NOTE — Patient Instructions (Signed)
Take diovan 80mg  a day for 1 week.  Calibrate your cuff when possible.  If BP is still >140/>90, then increase to 160mg  a day.   Update me about your BP in about 10 days.   Take care.  Glad to see you.  Go to the lab on the way out.  We'll contact you with your lab report.

## 2019-02-18 NOTE — Progress Notes (Signed)
This visit occurred during the SARS-CoV-2 public health emergency.  Safety protocols were in place, including screening questions prior to the visit, additional usage of staff PPE, and extensive cleaning of exam room while observing appropriate contact time as indicated for disinfecting solutions.  H/o HTN.  Prev on diovan. He has been working on diet and exercise.  He had checked BP and it was okay on home check.  DOT physical yesterday and BP was elevated.  BP elevated here today.   He has been off diovan for many months.    He is a Games developer, local. No CP, SOB, BLE edema.    Meds, vitals, and allergies reviewed.   ROS: Per HPI unless specifically indicated in ROS section   GEN: nad, alert and oriented HEENT: ncat NECK: supple w/o LA CV: rrr PULM: ctab, no inc wob ABD: soft, +bs EXT: no edema SKIN: no acute rash

## 2019-02-20 NOTE — Assessment & Plan Note (Signed)
Restart diovan 80mg  a day for 1 week. If BP is still >140/>90, then increase to 160mg  a day.   He can calibrate his cuff when possible.  He can update me about his blood pressure in about 10 days, sooner if needed. See notes on labs.

## 2019-03-14 ENCOUNTER — Telehealth: Payer: Self-pay

## 2019-03-14 ENCOUNTER — Ambulatory Visit (INDEPENDENT_AMBULATORY_CARE_PROVIDER_SITE_OTHER): Admitting: Family Medicine

## 2019-03-14 ENCOUNTER — Other Ambulatory Visit: Payer: Self-pay

## 2019-03-14 ENCOUNTER — Encounter: Payer: Self-pay | Admitting: Family Medicine

## 2019-03-14 VITALS — BP 148/96 | HR 86 | Temp 97.5°F | Ht 63.5 in | Wt 167.2 lb

## 2019-03-14 DIAGNOSIS — I10 Essential (primary) hypertension: Secondary | ICD-10-CM

## 2019-03-14 MED ORDER — VALSARTAN 160 MG PO TABS: 160.0000 mg | ORAL_TABLET | Freq: Every day | ORAL | 5 refills | Status: DC

## 2019-03-14 NOTE — Patient Instructions (Signed)
Increase back to 160mg  a day.  If needed increase to 240mg  then 320mg .  Go up weekly as needed.  Update me as needed.  If you need a new rx or if your BP still isn't controlled, then let me know.  Take care.  Glad to see you.

## 2019-03-14 NOTE — Telephone Encounter (Signed)
error 

## 2019-03-14 NOTE — Progress Notes (Signed)
This visit occurred during the SARS-CoV-2 public health emergency.  Safety protocols were in place, including screening questions prior to the visit, additional usage of staff PPE, and extensive cleaning of exam room while observing appropriate contact time as indicated for disinfecting solutions.  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 That was on 160mg  diovan a day.   Meds, vitals, and allergies reviewed.   PMH and SH reviewed  ROS: Per HPI unless specifically indicated in ROS section   GEN: nad, alert and oriented HEENT: ncat NECK: supple w/o LA CV: rrr. PULM: ctab, no inc wob ABD: soft, +bs EXT: no edema SKIN: Well-perfused

## 2019-03-16 NOTE — Assessment & Plan Note (Signed)
Discussed options Increase back to 160mg  a day.  If needed increase to 240mg  then 320mg .  Go up weekly as needed.  He will update me as needed. If needing a new rx or if BP still isn't controlled, then he will let me know.  Continue work on diet and exercise.

## 2019-05-07 ENCOUNTER — Ambulatory Visit: Attending: Internal Medicine

## 2019-05-07 ENCOUNTER — Other Ambulatory Visit: Payer: Self-pay

## 2019-05-07 DIAGNOSIS — Z23 Encounter for immunization: Secondary | ICD-10-CM | POA: Insufficient documentation

## 2019-05-07 NOTE — Progress Notes (Signed)
   Covid-19 Vaccination Clinic  Name:  Evan Rivera    MRN: [id number] DOB: 12-29-1960  05/07/2019  [name] was observed post Covid-19 immunization for 15 minutes without incident. He was provided with Vaccine Information Sheet and instruction to access the V-Safe system.   [name] was instructed to call 911 with any severe reactions post vaccine: Marland Kitchen Difficulty breathing  . Swelling of face and throat  . A fast heartbeat  . A bad rash all over body  . Dizziness and weakness   Immunizations Administered    Name Date Dose VIS Date Route   Moderna COVID-19 Vaccine 05/07/2019  4:05 PM 0.5 mL 02/01/2019 Intramuscular   Manufacturer: Moderna   Lot: [id number]   [place]:[id number]

## 2019-06-04 ENCOUNTER — Ambulatory Visit: Attending: Oncology

## 2019-06-04 ENCOUNTER — Other Ambulatory Visit: Payer: Self-pay

## 2019-06-04 DIAGNOSIS — Z23 Encounter for immunization: Secondary | ICD-10-CM

## 2019-06-04 NOTE — Progress Notes (Signed)
   Covid-19 Vaccination Clinic  Name:  NELLY DEVERS    MRN: [id number] DOB: May 09, 1960  06/04/2019  Mr. Crenshaw was observed post Covid-19 immunization for 30 minutes based on pre-vaccination screening without incident. He was provided with Vaccine Information Sheet and instruction to access the V-Safe system.   Mr. Nutting was instructed to call 911 with any severe reactions post vaccine: Marland Kitchen Difficulty breathing  . Swelling of face and throat  . A fast heartbeat  . A bad rash all over body  . Dizziness and weakness   Immunizations Administered    Name Date Dose VIS Date Route   Moderna COVID-19 Vaccine 06/04/2019  8:21 AM 0.5 mL 02/01/2019 Intramuscular   Manufacturer: Levan Hurst   LotEJ:[id number]   [place]:[id number]

## 2019-09-09 ENCOUNTER — Encounter (HOSPITAL_COMMUNITY): Payer: Self-pay | Admitting: Emergency Medicine

## 2019-09-09 ENCOUNTER — Emergency Department (HOSPITAL_COMMUNITY)

## 2019-09-09 ENCOUNTER — Other Ambulatory Visit: Payer: Self-pay

## 2019-09-09 ENCOUNTER — Telehealth: Payer: Self-pay | Admitting: Primary Care

## 2019-09-09 ENCOUNTER — Telehealth: Payer: Self-pay

## 2019-09-09 ENCOUNTER — Emergency Department (HOSPITAL_COMMUNITY)
Admission: EM | Admit: 2019-09-09 | Discharge: 2019-09-09 | Disposition: A | Discharge status: Home / Self Care | Attending: Emergency Medicine | Admitting: Emergency Medicine

## 2019-09-09 DIAGNOSIS — I10 Essential (primary) hypertension: Secondary | ICD-10-CM | POA: Insufficient documentation

## 2019-09-09 DIAGNOSIS — K219 Gastro-esophageal reflux disease without esophagitis: Secondary | ICD-10-CM | POA: Insufficient documentation

## 2019-09-09 DIAGNOSIS — R1013 Epigastric pain: Secondary | ICD-10-CM | POA: Diagnosis present

## 2019-09-09 DIAGNOSIS — Z79899 Other long term (current) drug therapy: Secondary | ICD-10-CM | POA: Diagnosis not present

## 2019-09-09 DIAGNOSIS — Z87891 Personal history of nicotine dependence: Secondary | ICD-10-CM | POA: Insufficient documentation

## 2019-09-09 LAB — BASIC METABOLIC PANEL
Anion gap: 8 (ref 5–15)
BUN: 16 mg/dL (ref 6–20)
CO2: 25 mmol/L (ref 22–32)
Calcium: 9.4 mg/dL (ref 8.9–10.3)
Chloride: 102 mmol/L (ref 98–111)
Creatinine, Ser: 0.87 mg/dL (ref 0.61–1.24)
GFR calc Af Amer: >60 mL/min (ref >60)
GFR calc non Af Amer: >60 mL/min (ref >60)
Glucose, Bld: 119 mg/dL — ABNORMAL HIGH (ref 70–99)
Potassium: 3.9 mmol/L (ref 3.5–5.1)
Sodium: 135 mmol/L (ref 135–145)

## 2019-09-09 LAB — TROPONIN I (HIGH SENSITIVITY)
Troponin I (High Sensitivity): 23 ng/L — ABNORMAL HIGH (ref <18)
Troponin I (High Sensitivity): 25 ng/L — ABNORMAL HIGH (ref <18)

## 2019-09-09 LAB — CBC
HCT: 43.7 % (ref 39.0–52.0)
Hemoglobin: 15.2 g/dL (ref 13.0–17.0)
MCH: 30.9 pg (ref 26.0–34.0)
MCHC: 34.8 g/dL (ref 30.0–36.0)
MCV: 88.8 fL (ref 80.0–100.0)
Platelets: 367 10*3/uL (ref 150–400)
RBC: 4.92 MIL/uL (ref 4.22–5.81)
RDW: 12.7 % (ref 11.5–15.5)
WBC: 6.4 10*3/uL (ref 4.0–10.5)
nRBC: 0 % (ref 0.0–0.2)

## 2019-09-09 MED ORDER — SODIUM CHLORIDE 0.9% FLUSH: 3.0000 mL | Freq: Once | INTRAVENOUS | Status: DC

## 2019-09-09 MED ORDER — SUCRALFATE 1 G PO TABS
1.0000 g | ORAL_TABLET | Freq: Four times a day (QID) | ORAL | 0 refills | Status: DC
Start: 2019-09-09 — End: 2019-09-26

## 2019-09-09 MED ORDER — PANTOPRAZOLE SODIUM 20 MG PO TBEC
20.0000 mg | DELAYED_RELEASE_TABLET | Freq: Two times a day (BID) | ORAL | 0 refills | Status: DC
Start: 2019-09-09 — End: 2019-09-26

## 2019-09-09 NOTE — Telephone Encounter (Signed)
Of note, I have not seen this patient since July of 2018. Will discuss during follow up visit.

## 2019-09-09 NOTE — Telephone Encounter (Signed)
[facility] - Client TELEPHONE ADVICE RECORD [facility] Patient Name: Evan Rivera Gender: Male DOB: 03-Jan-1961 Age: 59 Y 3 M 14 D Return Phone Number: [phone] (Primary) [place] [address] Client [facility] - Client Client Site [facility] - Day Physician Alma Friendly - NP Contact Type Call Who Is Calling Patient / Member / Family / Caregiver Call Type Triage / Clinical Relationship To Patient Self Return Phone Number [phone] (Primary) Chief Complaint CHEST PAIN (>=21 years) - pain, pressure, heaviness or tightness Reason for Call Symptomatic / Request for [place] from [place] with pt that has pain in sternum radiating to his neck/jaw does not happen all the time but it seems to happen more after eating and when the pain hits it is hard for him to breathe. He reported stermun Translation No Nurse Assessment Nurse: Noberto Retort, RN, Malvin Johns Date/Time (Eastern Time): 09/09/2019 1:21:25 PM Confirm and document reason for call. If symptomatic, describe symptoms. ---pt that has pain in sternum radiating to his neck/jaw does not happen all the time but it seems to happen more after eating and when the pain hits it is hard for him to breathe. He reports chest pressure as well below the sternum. Has the patient had close contact with a person known or suspected to have the novel coronavirus illness OR traveled / lives in area with major community spread (including international travel) in the last 14 days from the onset of symptoms? * If Asymptomatic, screen for exposure and travel within the last 14 days. ---No Does the patient have any new or worsening symptoms? ---Yes Will a triage be completed? ---Yes Related visit to physician within the last 2 weeks? ---No Does the PT have any chronic conditions? (i.e. diabetes, asthma, this  includes High risk factors for pregnancy, etc.) ---Yes List chronic conditions. ---HTN Is this a behavioral health or substance abuse call? ---No Guidelines Guideline Title Affirmed Question Affirmed Notes Nurse Date/Time Eilene Ghazi Time) Chest Pain [1] Chest pain (or "angina") comes and goes Noberto Retort, Therapist, sports, Malvin Johns 09/09/2019 1:22:49 PM PLEASE NOTE: All timestamps contained within this report are represented as [place] Standard Time. CONFIDENTIALTY NOTICE: This fax transmission is intended only for the addressee. It contains information that is legally privileged, confidential or otherwise protected from use or disclosure. If you are not the intended recipient, you are strictly prohibited from reviewing, disclosing, copying using or disseminating any of this information or taking any action in reliance on or regarding this information. If you have received this fax in error, please notify us immediately by telephone so that we can arrange for its return to [place]. Phone: [phone], Toll-Free: [phone], Fax: [phone] Page: 2 of 2 Call Id: [id number] Guidelines Guideline Title Affirmed Question Affirmed Notes Nurse Date/Time Eilene Ghazi Time) AND [2] is happening more often (increasing in frequency) or getting worse (increasing in severity) (Exception: chest pains that last only a few seconds) Disp. Time Eilene Ghazi Time) Disposition Final User 09/09/2019 1:19:05 PM Send to Urgent Queue Birdena Jubilee 09/09/2019 1:25:56 PM Go to ED Now Yes Noberto Retort, RN, Renne Musca Disagree/Comply Disagree Caller Understands Yes PreDisposition InappropriateToAsk Care Advice Given Per Guideline GO TO ED NOW: * You need to be seen in the Emergency Department. * Go to the ED at ___________ [place] now. Drive carefully. ANOTHER ADULT SHOULD DRIVE: * It is better and safer if another adult drives instead of you. CARE ADVICE given per Chest  Pain (Adult) guideline. Comments User: Marya Landry, RN  Date/Time (Eastern Time): 09/09/2019 1:27:25 PM PT states he will go to the ED as soon as he gets off of work, I recommended he go NOW but PT states that he cant and will go later. Referrals GO TO FACILITY REFUSED

## 2019-09-09 NOTE — ED Provider Notes (Signed)
[facility] EMERGENCY DEPARTMENT Provider Note   CSN: [id number] Arrival date & time: 09/09/19  1703     History No chief complaint on file.   Evan Rivera is a 59 y.o. male.  59 year old male who presents with longstanding history of GERD.  Patient states that he has epigastric discomfort which radiates to his jaw which occurs after he eats.  He denies any black or bloody stools.  No emesis.  States that he is used Mylanta without relief.  Has had no associate diaphoresis but some shortness of breath at times.  He denies any prior history of coronary artery disease.  He denies any fever or cough.  Patient concern of possible hiatal hernia and called his doctor who told to come here.        Past Medical History:  Diagnosis Date  . Allergy    seasonal  . GERD (gastroesophageal reflux disease)   . Hyperlipidemia    no meds   . Hypertension     Patient Active Problem List   Diagnosis Date Noted  . Essential hypertension 09/22/2016  . GERD (gastroesophageal reflux disease) 09/22/2016  . Hyperlipidemia 09/22/2016    Past Surgical History:  Procedure Laterality Date  . COLONOSCOPY     > 10 yeras ago- normal per pt.   Marland Kitchen HERNIA REPAIR     umbilical   . TONSILLECTOMY         Family History  Problem Relation Age of Onset  . Prostate cancer Father        late 79's  . Hyperlipidemia Father   . Hypertension Father   . Heart disease Mother   . Colon cancer Neg Hx   . Colon polyps Neg Hx   . Esophageal cancer Neg Hx   . Rectal cancer Neg Hx   . Stomach cancer Neg Hx     Social History   Tobacco Use  . Smoking status: Former Smoker    Types: Cigarettes    Quit date: 06/23/1989    Years since quitting: 30.2  . Smokeless tobacco: Former Network engineer Use Topics  . Alcohol use: No  . Drug use: No    Home Medications Prior to Admission medications   Medication Sig Start Date End Date Taking? Authorizing Provider  valsartan (DIOVAN) 160  MG tablet Take 1-2 tablets (160-320 mg total) by mouth daily. 03/14/19   Tonia Ghent, MD    Allergies    Statins  Review of Systems   Review of Systems  All other systems reviewed and are negative.   Physical Exam Updated Vital Signs BP (!) 187/109 (BP Location: Right Arm)   Pulse 68   Temp (!) 97.4 F (36.3 C)   Resp 16   Ht 1.626 m (5\' 4" )   Wt 72.6 kg   SpO2 100%   BMI 27.46 kg/m   Physical Exam Vitals and nursing note reviewed.  Constitutional:      General: He is not in acute distress.    Appearance: Normal appearance. He is well-developed. He is not toxic-appearing.  HENT:     Head: Normocephalic and atraumatic.  Eyes:     General: Lids are normal.     Conjunctiva/sclera: Conjunctivae normal.     Pupils: Pupils are equal, round, and reactive to light.  Neck:     Thyroid: No thyroid mass.     Trachea: No tracheal deviation.  Cardiovascular:     Rate and Rhythm: Normal rate and regular rhythm.  Heart sounds: Normal heart sounds. No murmur heard.  No gallop.   Pulmonary:     Effort: Pulmonary effort is normal. No respiratory distress.     Breath sounds: Normal breath sounds. No stridor. No decreased breath sounds, wheezing, rhonchi or rales.  Abdominal:     General: Bowel sounds are normal. There is no distension.     Palpations: Abdomen is soft.     Tenderness: There is no abdominal tenderness. There is no rebound.  Musculoskeletal:        General: No tenderness. Normal range of motion.     Cervical back: Normal range of motion and neck supple.  Skin:    General: Skin is warm and dry.     Findings: No abrasion or rash.  Neurological:     Mental Status: He is alert and oriented to person, place, and time.     GCS: GCS eye subscore is 4. GCS verbal subscore is 5. GCS motor subscore is 6.     Cranial Nerves: No cranial nerve deficit.     Sensory: No sensory deficit.  Psychiatric:        Speech: Speech normal.        Behavior: Behavior normal.      ED Results / Procedures / Treatments   Labs (all labs ordered are listed, but only abnormal results are displayed) Labs Reviewed  BASIC METABOLIC PANEL - Abnormal; Notable for the following components:      Result Value   Glucose, Bld 119 (*)    All other components within normal limits  TROPONIN I (HIGH SENSITIVITY) - Abnormal; Notable for the following components:   Troponin I (High Sensitivity) 23 (*)    All other components within normal limits  TROPONIN I (HIGH SENSITIVITY) - Abnormal; Notable for the following components:   Troponin I (High Sensitivity) 25 (*)    All other components within normal limits  CBC    EKG EKG Interpretation  Date/Time:  Friday September 09 2019 17:27:51 EDT Ventricular Rate:  73 PR Interval:  142 QRS Duration: 88 QT Interval:  374 QTC Calculation: 412 R Axis:   36 Text Interpretation: Normal sinus rhythm Cannot rule out Inferior infarct , age undetermined T wave abnormality, consider lateral ischemia Abnormal ECG Confirmed by Lacretia Leigh ([id number]) on 09/09/2019 9:38:58 PM   Radiology DG Chest 2 View  Result Date: 09/09/2019 CLINICAL DATA:  Chest pain and chest pressure. EXAM: CHEST - 2 VIEW COMPARISON:  06/24/2014 FINDINGS: There is a rounded density projecting over the anterior heart border on the lateral view, new since prior study. There is no pneumothorax or focal infiltrate. Atherosclerotic changes are noted of the thoracic aorta. The heart size is normal. There is no acute osseous abnormality. IMPRESSION: 1. No acute cardiopulmonary process. 2. New rounded density projecting over the anterior heart border on the lateral view. This may represent artifact from overlapping osseous structures. However, a follow-up two-view chest x-ray is recommended in 4-6 weeks to confirm resolution of this finding as an underlying pulmonary nodule is not excluded. Electronically Signed   By: Constance Holster M.D.   On: 09/09/2019 18:12     Procedures Procedures (including critical care time)  Medications Ordered in ED Medications  sodium chloride flush (NS) 0.9 % injection 3 mL (has no administration in time range)    ED Course  I have reviewed the triage vital signs and the nursing notes.  Pertinent labs & imaging results that were available during my care of the  patient were reviewed by me and considered in my medical decision making (see chart for details).    MDM Rules/Calculators/A&P                          Patient has a heart score of 3.  States that his symptoms have really been pretty stable for several months.  Daily recently came in today was at the urging of his wife and his PCP.  Have informed patient to follow-up with his PCP next week for outpatient testing and given return precautions.  We will also place on PPI and Carafate Final Clinical Impression(s) / ED Diagnoses Final diagnoses:  None    Rx / DC Orders ED Discharge Orders    None       Lacretia Leigh, MD 09/09/19 2154

## 2019-09-09 NOTE — Telephone Encounter (Signed)
I spoke with pt; pt said he does not think he needs to go to ED or UC;pt said having symptoms for 4 - 5 yrs. Symptoms have worsened the last couple of months; pt said has just below sternum pain and pressure that radiates to both sides of jaw. Pt said had last pain 2 days ago. No pain now. Pt said one day was so bad that pt was driving and had to pull over and walk around a few minutes before pain below sternum eased off. Wk ago BP 128/78; BP today 143/90 P 78 and after several mins recked BP and BP 132/88. Pt said he does not think this is serious and wants appt at [facility] next wk. I advised pt this could be a lot of different diagnosis but concerned the more serious would be heart attack. Pt voiced  Understanding. I spoke with Gentry Fitz NP and said to tell pt we have given him advisement of our suggestions for his best care. Pt said OK he would go to ED and wanted to schedule appt with Gentry Fitz NP next wk. Scheduled ED FU with Gentry Fitz NP on 09/13/19 at 2:40. FYI to Gentry Fitz NP. Pt has no covid symptoms, no travel and no known exposure to + covid. Pt has had both covid vaccines and is under immunization tab.

## 2019-09-09 NOTE — ED Triage Notes (Signed)
Pt here from home with c/o epigastric pain that radiates up to his chest and jaw most of time after eating

## 2019-09-09 NOTE — Telephone Encounter (Signed)
Opened in error

## 2019-09-09 NOTE — ED Notes (Signed)
Pt verbalized understanding of d/c instructions, follow up care, prescriptions and s/s requiring return to ed. Pt had no further questions and refused wheelchair. Pt ambulated to exit.

## 2019-09-09 NOTE — Discharge Instructions (Signed)
Call your doctor Monday to schedule a follow-up visit.  Return here if your symptoms should become worse

## 2019-09-13 ENCOUNTER — Ambulatory Visit (INDEPENDENT_AMBULATORY_CARE_PROVIDER_SITE_OTHER): Admitting: Primary Care

## 2019-09-13 ENCOUNTER — Encounter: Payer: Self-pay | Admitting: Primary Care

## 2019-09-13 ENCOUNTER — Other Ambulatory Visit: Payer: Self-pay

## 2019-09-13 VITALS — BP 132/84 | HR 82 | Temp 96.6°F | Ht 63.5 in | Wt 165.8 lb

## 2019-09-13 DIAGNOSIS — K219 Gastro-esophageal reflux disease without esophagitis: Secondary | ICD-10-CM

## 2019-09-13 DIAGNOSIS — R739 Hyperglycemia, unspecified: Secondary | ICD-10-CM | POA: Diagnosis not present

## 2019-09-13 DIAGNOSIS — R1013 Epigastric pain: Secondary | ICD-10-CM

## 2019-09-13 DIAGNOSIS — Z8042 Family history of malignant neoplasm of prostate: Secondary | ICD-10-CM | POA: Diagnosis not present

## 2019-09-13 DIAGNOSIS — E785 Hyperlipidemia, unspecified: Secondary | ICD-10-CM

## 2019-09-13 DIAGNOSIS — I10 Essential (primary) hypertension: Secondary | ICD-10-CM

## 2019-09-13 NOTE — Assessment & Plan Note (Signed)
Uncontrolled, increased over the last several months. ED notes reviewed, did not seem to have ACS or cardiac cause for symptoms.   Improving since he started pantoprazole BID and Carafate. Continue same.  Checking labs. Referral placed to GI for potential endoscopy, he may have hiatal hernia involvement.

## 2019-09-13 NOTE — Assessment & Plan Note (Signed)
Controlled on valsartan 320 mg, continue same. BMP reviewed from recent ED visit.

## 2019-09-13 NOTE — Patient Instructions (Signed)
Stop by the lab prior to leaving today. I will notify you of your results once received.   Continue pantoprazole 20 mg twice daily. Continue Carafate 3-4 times daily before a meal.   You will be contacted regarding your referral to GI.  Please let us know if you have not been contacted within two weeks.   It was a pleasure to see you today!   Food Choices for Gastroesophageal Reflux Disease, Adult When you have gastroesophageal reflux disease (GERD), the foods you eat and your eating habits are very important. Choosing the right foods can help ease your discomfort. Think about working with a nutrition specialist (dietitian) to help you make good choices. What are tips for following this plan?  Meals  Choose healthy foods that are low in fat, such as fruits, vegetables, whole grains, low-fat dairy products, and lean meat, fish, and poultry.  Eat small meals often instead of 3 large meals a day. Eat your meals slowly, and in a place where you are relaxed. Avoid bending over or lying down until 2-3 hours after eating.  Avoid eating meals 2-3 hours before bed.  Avoid drinking a lot of liquid with meals.  Cook foods using methods other than frying. Bake, grill, or broil food instead.  Avoid or limit: ? Chocolate. ? Peppermint or spearmint. ? Alcohol. ? Pepper. ? Black and decaffeinated coffee. ? Black and decaffeinated tea. ? Bubbly (carbonated) soft drinks. ? Caffeinated energy drinks and soft drinks.  Limit high-fat foods such as: ? Fatty meat or fried foods. ? Whole milk, cream, butter, or ice cream. ? Nuts and nut butters. ? Pastries, donuts, and sweets made with butter or shortening.  Avoid foods that cause symptoms. These foods may be different for everyone. Common foods that cause symptoms include: ? Tomatoes. ? Oranges, lemons, and limes. ? Peppers. ? Spicy food. ? Onions and garlic. ? Vinegar. Lifestyle  Maintain a healthy weight. Ask your doctor what weight is  healthy for you. If you need to lose weight, work with your doctor to do so safely.  Exercise for at least 30 minutes for 5 or more days each week, or as told by your doctor.  Wear loose-fitting clothes.  Do not smoke. If you need help quitting, ask your doctor.  Sleep with the head of your bed higher than your feet. Use a wedge under the mattress or blocks under the bed frame to raise the head of the bed. Summary  When you have gastroesophageal reflux disease (GERD), food and lifestyle choices are very important in easing your symptoms.  Eat small meals often instead of 3 large meals a day. Eat your meals slowly, and in a place where you are relaxed.  Limit high-fat foods such as fatty meat or fried foods.  Avoid bending over or lying down until 2-3 hours after eating.  Avoid peppermint and spearmint, caffeine, alcohol, and chocolate. This information is not intended to replace advice given to you by your health care provider. Make sure you discuss any questions you have with your health care provider. Document Revised: 06/10/2018 Document Reviewed: 03/25/2016 Elsevier Patient Education  [place].

## 2019-09-13 NOTE — Assessment & Plan Note (Signed)
FH of heart disease in his mother, CABG. Repeat lipids pending. Consider statin therapy if warranted.

## 2019-09-13 NOTE — Progress Notes (Signed)
Subjective:    Patient ID: Evan Rivera, male    DOB: 02/06/1961, 59 y.o.   MRN: [id number]  HPI  This visit occurred during the SARS-CoV-2 public health emergency.  Safety protocols were in place, including screening questions prior to the visit, additional usage of staff PPE, and extensive cleaning of exam room while observing appropriate contact time as indicated for disinfecting solutions.   Evan Rivera is a 59 year old male with a history of GERD, hypertension, hyperlipidemia who presents today for emergency department follow up.  He presented to [facility] on 09/09/19 with a chief complaint of epigastric discomfort with radiation to bilateral jaw, occurs when eating. No improvement with Mylanta. During his stay in the ED his high sensitivity troponin level was slightly elevated. Normal CBC. BMP with hyperglycemia, otherwise negative. ECG did not show acute coronary syndrome so he was sent home with prescriptions for Carafate and Protonix.  Today he endorses a chronic history of substernal chest pressure that occurs within a few minutes to hours after eating. Also with a "cold sensation" to the throat most of the time, sometimes several bilateral jaw pain. He will belch which will relieve his chest pressure temporarily.   He underwent endoscopy in 2015 which was "normal". He's been taking the pantoprazole twice daily for the last three days, also Carafate four times daily with slight improvement. He's not experienced any jaw pain since starting pantoprazole and Carafate.   He denies diaphoresis, nausea. He has a family history of CABG in his mother. He has been compliant to his valsartan.  BP Readings from Last 3 Encounters:  09/13/19 132/84  09/09/19 (!) 162/107  03/14/19 (!) 148/96     Review of Systems  Constitutional: Negative for diaphoresis.  Respiratory: Negative for shortness of breath.   Cardiovascular: Negative for chest pain.  Gastrointestinal: Negative for constipation,  diarrhea, nausea and vomiting.       See HPI  Neurological: Negative for dizziness.       Past Medical History:  Diagnosis Date  . Allergy    seasonal  . GERD (gastroesophageal reflux disease)   . Hyperlipidemia    no meds   . Hypertension      Social History   Socioeconomic History  . Marital status: Married    Spouse name: Not on file  . Number of children: Not on file  . Years of education: Not on file  . Highest education level: Not on file  Occupational History  . Not on file  Tobacco Use  . Smoking status: Former Smoker    Types: Cigarettes    Quit date: 06/23/1989    Years since quitting: 30.2  . Smokeless tobacco: Former Network engineer and Sexual Activity  . Alcohol use: No  . Drug use: No  . Sexual activity: Not on file  Other Topics Concern  . Not on file  Social History Narrative   Married.   2 children.   Works as a Administrator.   Enjoys kayaking.    [employer] years.     Social Determinants of Health   Financial Resource Strain:   . Difficulty of Paying Living Expenses:   Food Insecurity:   . Worried About Charity fundraiser in the Last Year:   . Arboriculturist in the Last Year:   Transportation Needs:   . Film/video editor (Medical):   Marland Kitchen Lack of Transportation (Non-Medical):   Physical Activity:   . Days of  Exercise per Week:   . Minutes of Exercise per Session:   Stress:   . Feeling of Stress :   Social Connections:   . Frequency of Communication with Friends and Family:   . Frequency of Social Gatherings with Friends and Family:   . Attends Religious Services:   . Active Member of Clubs or Organizations:   . Attends Archivist Meetings:   Marland Kitchen Marital Status:   Intimate Partner Violence:   . Fear of Current or Ex-Partner:   . Emotionally Abused:   Marland Kitchen Physically Abused:   . Sexually Abused:     Past Surgical History:  Procedure Laterality Date  . COLONOSCOPY     > 10 yeras ago- normal per pt.   Marland Kitchen HERNIA REPAIR      umbilical   . TONSILLECTOMY      Family History  Problem Relation Age of Onset  . Prostate cancer Father        late 83's  . Hyperlipidemia Father   . Hypertension Father   . Heart disease Mother   . Colon cancer Neg Hx   . Colon polyps Neg Hx   . Esophageal cancer Neg Hx   . Rectal cancer Neg Hx   . Stomach cancer Neg Hx     Allergies  Allergen Reactions  . Statins Anaphylaxis    Pt does not remember which statin caused the reaction  . Drug [Tape] Hives    Reaction to some forms of bandaids - please use paper tape    Current Outpatient Medications on File Prior to Visit  Medication Sig Dispense Refill  . Ascorbic Acid (VITAMIN C) 1000 MG tablet Take 2,000 mg by mouth at bedtime.    . Aspirin-Acetaminophen-Caffeine (GOODY HEADACHE PO) Take 2 packets by mouth daily as needed (severe headache).    . cholecalciferol (VITAMIN D) 25 MCG (1000 UNIT) tablet Take 2,000 Units by mouth at bedtime.    . Garlic 8416 MG CAPS Take 2,000 mg by mouth at bedtime.    Marland Kitchen ibuprofen (ADVIL) 200 MG tablet Take 800 mg by mouth every 6 (six) hours as needed for headache (pain).    . Multiple Vitamins-Minerals (ZINC PO) Take 1 tablet by mouth at bedtime.    . pantoprazole (PROTONIX) 20 MG tablet Take 1 tablet (20 mg total) by mouth 2 (two) times daily. 60 tablet 0  . sucralfate (CARAFATE) 1 g tablet Take 1 tablet (1 g total) by mouth 4 (four) times daily. 30 tablet 0  . valsartan (DIOVAN) 160 MG tablet Take 1-2 tablets (160-320 mg total) by mouth daily. (Patient taking differently: Take 320 mg by mouth at bedtime. ) 60 tablet 5   No current facility-administered medications on file prior to visit.    BP 132/84   Pulse 82   Temp (!) 96.6 F (35.9 C) (Temporal)   Ht 5' 3.5" (1.613 m)   Wt 165 lb 12 oz (75.2 kg)   SpO2 98%   BMI 28.90 kg/m    Objective:   Physical Exam Cardiovascular:     Rate and Rhythm: Regular rhythm.  Pulmonary:     Effort: Pulmonary effort is normal.     Breath  sounds: Normal breath sounds.  Abdominal:     General: Abdomen is flat. Bowel sounds are normal.     Palpations: Abdomen is soft.     Tenderness: There is abdominal tenderness in the epigastric area.  Neurological:     Mental Status: He is alert.  Psychiatric:        Mood and Affect: Mood normal.            Assessment & Plan:

## 2019-09-14 ENCOUNTER — Encounter: Payer: Self-pay | Admitting: Nurse Practitioner

## 2019-09-14 LAB — HEPATIC FUNCTION PANEL
ALT: 25 U/L (ref 0–53)
AST: 14 U/L (ref 0–37)
Albumin: 4.6 g/dL (ref 3.5–5.2)
Alkaline Phosphatase: 68 U/L (ref 39–117)
Bilirubin, Direct: 0.1 mg/dL (ref 0.0–0.3)
Total Bilirubin: 0.4 mg/dL (ref 0.2–1.2)
Total Protein: 6.4 g/dL (ref 6.0–8.3)

## 2019-09-14 LAB — LIPID PANEL
Cholesterol: 237 mg/dL — ABNORMAL HIGH (ref 0–200)
HDL: 35 mg/dL — ABNORMAL LOW (ref >39.00)
Total CHOL/HDL Ratio: 7
Triglycerides: 563 mg/dL — ABNORMAL HIGH (ref 0.0–149.0)

## 2019-09-14 LAB — HEMOGLOBIN A1C: Hgb A1c MFr Bld: 5.8 % (ref 4.6–6.5)

## 2019-09-14 LAB — LIPASE: Lipase: 38 U/L (ref 11.0–59.0)

## 2019-09-14 LAB — PSA: PSA: 0.3 ng/mL (ref 0.10–4.00)

## 2019-09-14 LAB — LDL CHOLESTEROL, DIRECT: Direct LDL: 97 mg/dL

## 2019-09-26 ENCOUNTER — Inpatient Hospital Stay (HOSPITAL_COMMUNITY)

## 2019-09-26 ENCOUNTER — Other Ambulatory Visit: Payer: Self-pay | Admitting: *Deleted

## 2019-09-26 ENCOUNTER — Encounter (HOSPITAL_COMMUNITY): Payer: Self-pay | Admitting: Emergency Medicine

## 2019-09-26 ENCOUNTER — Other Ambulatory Visit: Payer: Self-pay

## 2019-09-26 ENCOUNTER — Emergency Department (HOSPITAL_COMMUNITY)

## 2019-09-26 ENCOUNTER — Inpatient Hospital Stay (HOSPITAL_COMMUNITY)
Admission: EM | Disposition: A | Discharge status: Home / Self Care | Payer: Self-pay | Source: Home / Self Care | Attending: Cardiothoracic Surgery

## 2019-09-26 ENCOUNTER — Inpatient Hospital Stay (HOSPITAL_COMMUNITY)
Admission: EM | Admit: 2019-09-26 | Discharge: 2019-10-01 | DRG: 234 | Disposition: A | Discharge status: Home / Self Care | Attending: Cardiothoracic Surgery | Admitting: Cardiothoracic Surgery

## 2019-09-26 DIAGNOSIS — I25110 Atherosclerotic heart disease of native coronary artery with unstable angina pectoris: Secondary | ICD-10-CM | POA: Diagnosis present

## 2019-09-26 DIAGNOSIS — Z87891 Personal history of nicotine dependence: Secondary | ICD-10-CM

## 2019-09-26 DIAGNOSIS — E7800 Pure hypercholesterolemia, unspecified: Secondary | ICD-10-CM | POA: Diagnosis not present

## 2019-09-26 DIAGNOSIS — Y838 Other surgical procedures as the cause of abnormal reaction of the patient, or of later complication, without mention of misadventure at the time of the procedure: Secondary | ICD-10-CM | POA: Diagnosis not present

## 2019-09-26 DIAGNOSIS — R222 Localized swelling, mass and lump, trunk: Secondary | ICD-10-CM | POA: Diagnosis present

## 2019-09-26 DIAGNOSIS — Z83438 Family history of other disorder of lipoprotein metabolism and other lipidemia: Secondary | ICD-10-CM | POA: Diagnosis not present

## 2019-09-26 DIAGNOSIS — K219 Gastro-esophageal reflux disease without esophagitis: Secondary | ICD-10-CM | POA: Diagnosis present

## 2019-09-26 DIAGNOSIS — R6884 Jaw pain: Secondary | ICD-10-CM | POA: Diagnosis present

## 2019-09-26 DIAGNOSIS — Z20822 Contact with and (suspected) exposure to covid-19: Secondary | ICD-10-CM | POA: Diagnosis present

## 2019-09-26 DIAGNOSIS — I214 Non-ST elevation (NSTEMI) myocardial infarction: Principal | ICD-10-CM

## 2019-09-26 DIAGNOSIS — Z09 Encounter for follow-up examination after completed treatment for conditions other than malignant neoplasm: Secondary | ICD-10-CM

## 2019-09-26 DIAGNOSIS — I2584 Coronary atherosclerosis due to calcified coronary lesion: Secondary | ICD-10-CM | POA: Diagnosis present

## 2019-09-26 DIAGNOSIS — R000 Tachycardia, unspecified: Secondary | ICD-10-CM | POA: Diagnosis present

## 2019-09-26 DIAGNOSIS — Z8042 Family history of malignant neoplasm of prostate: Secondary | ICD-10-CM

## 2019-09-26 DIAGNOSIS — I10 Essential (primary) hypertension: Secondary | ICD-10-CM | POA: Diagnosis present

## 2019-09-26 DIAGNOSIS — E785 Hyperlipidemia, unspecified: Secondary | ICD-10-CM | POA: Diagnosis present

## 2019-09-26 DIAGNOSIS — R079 Chest pain, unspecified: Secondary | ICD-10-CM

## 2019-09-26 DIAGNOSIS — Z01810 Encounter for preprocedural cardiovascular examination: Secondary | ICD-10-CM

## 2019-09-26 DIAGNOSIS — I442 Atrioventricular block, complete: Secondary | ICD-10-CM | POA: Diagnosis not present

## 2019-09-26 DIAGNOSIS — Z91048 Other nonmedicinal substance allergy status: Secondary | ICD-10-CM

## 2019-09-26 DIAGNOSIS — Z951 Presence of aortocoronary bypass graft: Secondary | ICD-10-CM

## 2019-09-26 DIAGNOSIS — J9811 Atelectasis: Secondary | ICD-10-CM | POA: Diagnosis not present

## 2019-09-26 DIAGNOSIS — J95811 Postprocedural pneumothorax: Secondary | ICD-10-CM | POA: Diagnosis not present

## 2019-09-26 DIAGNOSIS — I2510 Atherosclerotic heart disease of native coronary artery without angina pectoris: Secondary | ICD-10-CM

## 2019-09-26 DIAGNOSIS — Z4682 Encounter for fitting and adjustment of non-vascular catheter: Secondary | ICD-10-CM

## 2019-09-26 DIAGNOSIS — Z8249 Family history of ischemic heart disease and other diseases of the circulatory system: Secondary | ICD-10-CM | POA: Diagnosis not present

## 2019-09-26 DIAGNOSIS — Z888 Allergy status to other drugs, medicaments and biological substances status: Secondary | ICD-10-CM | POA: Diagnosis not present

## 2019-09-26 DIAGNOSIS — I252 Old myocardial infarction: Secondary | ICD-10-CM | POA: Diagnosis present

## 2019-09-26 DIAGNOSIS — J939 Pneumothorax, unspecified: Secondary | ICD-10-CM

## 2019-09-26 HISTORY — PX: LEFT HEART CATH AND CORONARY ANGIOGRAPHY: CATH118249

## 2019-09-26 LAB — BASIC METABOLIC PANEL
Anion gap: 10 (ref 5–15)
Anion gap: 7 (ref 5–15)
BUN: 20 mg/dL (ref 6–20)
BUN: 20 mg/dL (ref 6–20)
CO2: 25 mmol/L (ref 22–32)
CO2: 22 mmol/L (ref 22–32)
Calcium: 9.6 mg/dL (ref 8.9–10.3)
Calcium: 8.8 mg/dL — ABNORMAL LOW (ref 8.9–10.3)
Chloride: 104 mmol/L (ref 98–111)
Chloride: 108 mmol/L (ref 98–111)
Creatinine, Ser: 1.02 mg/dL (ref 0.61–1.24)
Creatinine, Ser: 0.94 mg/dL (ref 0.61–1.24)
GFR calc Af Amer: >60 mL/min (ref >60)
GFR calc Af Amer: >60 mL/min (ref >60)
GFR calc non Af Amer: >60 mL/min (ref >60)
GFR calc non Af Amer: >60 mL/min (ref >60)
Glucose, Bld: 117 mg/dL — ABNORMAL HIGH (ref 70–99)
Glucose, Bld: 101 mg/dL — ABNORMAL HIGH (ref 70–99)
Potassium: 4 mmol/L (ref 3.5–5.1)
Potassium: 4 mmol/L (ref 3.5–5.1)
Sodium: 139 mmol/L (ref 135–145)
Sodium: 137 mmol/L (ref 135–145)

## 2019-09-26 LAB — CBC WITH DIFFERENTIAL/PLATELET
Abs Immature Granulocytes: 0.02 10*3/uL (ref 0.00–0.07)
Abs Immature Granulocytes: 0.02 10*3/uL (ref 0.00–0.07)
Basophils Absolute: 0.1 10*3/uL (ref 0.0–0.1)
Basophils Absolute: 0 10*3/uL (ref 0.0–0.1)
Basophils Relative: 1 %
Basophils Relative: 1 %
Eosinophils Absolute: 0.2 10*3/uL (ref 0.0–0.5)
Eosinophils Absolute: 0.2 10*3/uL (ref 0.0–0.5)
Eosinophils Relative: 2 %
Eosinophils Relative: 3 %
HCT: 44 % (ref 39.0–52.0)
HCT: 41.9 % (ref 39.0–52.0)
Hemoglobin: 14.9 g/dL (ref 13.0–17.0)
Hemoglobin: 14.3 g/dL (ref 13.0–17.0)
Immature Granulocytes: 0 %
Immature Granulocytes: 0 %
Lymphocytes Relative: 20 %
Lymphocytes Relative: 17 %
Lymphs Abs: 1.8 10*3/uL (ref 0.7–4.0)
Lymphs Abs: 1.4 10*3/uL (ref 0.7–4.0)
MCH: 30.7 pg (ref 26.0–34.0)
MCH: 30.7 pg (ref 26.0–34.0)
MCHC: 33.9 g/dL (ref 30.0–36.0)
MCHC: 34.1 g/dL (ref 30.0–36.0)
MCV: 90.7 fL (ref 80.0–100.0)
MCV: 89.9 fL (ref 80.0–100.0)
Monocytes Absolute: 0.7 10*3/uL (ref 0.1–1.0)
Monocytes Absolute: 0.6 10*3/uL (ref 0.1–1.0)
Monocytes Relative: 8 %
Monocytes Relative: 8 %
Neutro Abs: 6.3 10*3/uL (ref 1.7–7.7)
Neutro Abs: 5.9 10*3/uL (ref 1.7–7.7)
Neutrophils Relative %: 69 %
Neutrophils Relative %: 71 %
Platelets: 334 10*3/uL (ref 150–400)
Platelets: 330 10*3/uL (ref 150–400)
RBC: 4.85 MIL/uL (ref 4.22–5.81)
RBC: 4.66 MIL/uL (ref 4.22–5.81)
RDW: 12.9 % (ref 11.5–15.5)
RDW: 12.6 % (ref 11.5–15.5)
WBC: 9.1 10*3/uL (ref 4.0–10.5)
WBC: 8.2 10*3/uL (ref 4.0–10.5)
nRBC: 0 % (ref 0.0–0.2)
nRBC: 0 % (ref 0.0–0.2)

## 2019-09-26 LAB — HIV ANTIBODY (ROUTINE TESTING W REFLEX): HIV Screen 4th Generation wRfx: NONREACTIVE

## 2019-09-26 LAB — PULMONARY FUNCTION TEST
FEF 25-75 Pre: 3.63 L/sec
FEF2575-%Pred-Pre: 148 %
FEV1-%Pred-Pre: 108 %
FEV1-Pre: 3.13 L
FEV1FVC-%Pred-Pre: 108 %
FEV6-%Pred-Pre: 105 %
FEV6-Pre: 3.8 L
FEV6FVC-%Pred-Pre: 105 %
FVC-%Pred-Pre: 100 %
FVC-Pre: 3.81 L
Pre FEV1/FVC ratio: 82 %
Pre FEV6/FVC Ratio: 100 %

## 2019-09-26 LAB — LIPID PANEL
Cholesterol: 191 mg/dL (ref 0–200)
HDL: 40 mg/dL — ABNORMAL LOW (ref >40)
LDL Cholesterol: 118 mg/dL — ABNORMAL HIGH (ref 0–99)
Total CHOL/HDL Ratio: 4.8 RATIO
Triglycerides: 164 mg/dL — ABNORMAL HIGH (ref <150)
VLDL: 33 mg/dL (ref 0–40)

## 2019-09-26 LAB — COMPREHENSIVE METABOLIC PANEL
ALT: 28 U/L (ref 0–44)
AST: 60 U/L — ABNORMAL HIGH (ref 15–41)
Albumin: 3.9 g/dL (ref 3.5–5.0)
Alkaline Phosphatase: 58 U/L (ref 38–126)
Anion gap: 9 (ref 5–15)
BUN: 15 mg/dL (ref 6–20)
CO2: 24 mmol/L (ref 22–32)
Calcium: 9 mg/dL (ref 8.9–10.3)
Chloride: 106 mmol/L (ref 98–111)
Creatinine, Ser: 0.85 mg/dL (ref 0.61–1.24)
GFR calc Af Amer: >60 mL/min (ref >60)
GFR calc non Af Amer: >60 mL/min (ref >60)
Glucose, Bld: 101 mg/dL — ABNORMAL HIGH (ref 70–99)
Potassium: 3.8 mmol/L (ref 3.5–5.1)
Sodium: 139 mmol/L (ref 135–145)
Total Bilirubin: 0.7 mg/dL (ref 0.3–1.2)
Total Protein: 6.3 g/dL — ABNORMAL LOW (ref 6.5–8.1)

## 2019-09-26 LAB — CBC
HCT: 44.1 % (ref 39.0–52.0)
Hemoglobin: 14.8 g/dL (ref 13.0–17.0)
MCH: 29.8 pg (ref 26.0–34.0)
MCHC: 33.6 g/dL (ref 30.0–36.0)
MCV: 88.7 fL (ref 80.0–100.0)
Platelets: 360 10*3/uL (ref 150–400)
RBC: 4.97 MIL/uL (ref 4.22–5.81)
RDW: 12.4 % (ref 11.5–15.5)
WBC: 11 10*3/uL — ABNORMAL HIGH (ref 4.0–10.5)
nRBC: 0 % (ref 0.0–0.2)

## 2019-09-26 LAB — ECHOCARDIOGRAM COMPLETE
Area-P 1/2: 3.65 cm2
Height: 64 in
S' Lateral: 3.1 cm
Weight: 2564.39 oz

## 2019-09-26 LAB — TROPONIN I (HIGH SENSITIVITY)
Troponin I (High Sensitivity): 776 ng/L (ref <18)
Troponin I (High Sensitivity): 4098 ng/L (ref <18)
Troponin I (High Sensitivity): 9775 ng/L (ref <18)
Troponin I (High Sensitivity): 10113 ng/L (ref <18)

## 2019-09-26 LAB — PROTIME-INR
INR: 1 (ref 0.8–1.2)
INR: 1.1 (ref 0.8–1.2)
Prothrombin Time: 12.9 seconds (ref 11.4–15.2)
Prothrombin Time: 13.3 seconds (ref 11.4–15.2)

## 2019-09-26 LAB — BLOOD GAS, ARTERIAL
Acid-base deficit: 1.4 mmol/L (ref 0.0–2.0)
Bicarbonate: 22.7 mmol/L (ref 20.0–28.0)
Drawn by: 252031
FIO2: 21
O2 Saturation: 96.5 %
Patient temperature: 37
pCO2 arterial: 37.4 mmHg (ref 32.0–48.0)
pH, Arterial: 7.4 (ref 7.350–7.450)
pO2, Arterial: 83.3 mmHg (ref 83.0–108.0)

## 2019-09-26 LAB — APTT: aPTT: 46 seconds — ABNORMAL HIGH (ref 24–36)

## 2019-09-26 LAB — HEMOGLOBIN A1C
Hgb A1c MFr Bld: 5.6 % (ref 4.8–5.6)
Hgb A1c MFr Bld: 5.6 % (ref 4.8–5.6)
Mean Plasma Glucose: 114.02 mg/dL
Mean Plasma Glucose: 114.02 mg/dL

## 2019-09-26 LAB — ABO/RH: ABO/RH(D): O POS

## 2019-09-26 LAB — TSH: TSH: 1.054 u[IU]/mL (ref 0.350–4.500)

## 2019-09-26 LAB — HEPARIN LEVEL (UNFRACTIONATED): Heparin Unfractionated: <0.1 IU/mL — ABNORMAL LOW (ref 0.30–0.70)

## 2019-09-26 LAB — SARS CORONAVIRUS 2 BY RT PCR (HOSPITAL ORDER, PERFORMED IN ~~LOC~~ HOSPITAL LAB): SARS Coronavirus 2: NEGATIVE

## 2019-09-26 LAB — MAGNESIUM: Magnesium: 2.3 mg/dL (ref 1.7–2.4)

## 2019-09-26 SURGERY — LEFT HEART CATH AND CORONARY ANGIOGRAPHY
Anesthesia: LOCAL

## 2019-09-26 MED ORDER — CHLORHEXIDINE GLUCONATE CLOTH 2 % EX PADS
6.0000 | MEDICATED_PAD | Freq: Once | CUTANEOUS | Status: AC
  Administered 2019-09-26: 6 via TOPICAL

## 2019-09-26 MED ORDER — HEPARIN (PORCINE) 25000 UT/250ML-% IV SOLN
900.0000 [IU]/h | INTRAVENOUS | Status: DC
  Administered 2019-09-26: 900 [IU]/h via INTRAVENOUS
  Filled 2019-09-26: qty 250

## 2019-09-26 MED ORDER — TEMAZEPAM 15 MG PO CAPS
15.0000 mg | ORAL_CAPSULE | Freq: Once | ORAL | Status: AC | PRN
  Administered 2019-09-26: 15 mg via ORAL
  Filled 2019-09-26: qty 1

## 2019-09-26 MED ORDER — BISACODYL 5 MG PO TBEC
5.0000 mg | DELAYED_RELEASE_TABLET | Freq: Once | ORAL | Status: AC
  Administered 2019-09-26: 5 mg via ORAL
  Filled 2019-09-26: qty 1

## 2019-09-26 MED ORDER — NOREPINEPHRINE 4 MG/250ML-% IV SOLN
0.0000 ug/min | INTRAVENOUS | Status: DC
  Filled 2019-09-26: qty 250

## 2019-09-26 MED ORDER — SODIUM CHLORIDE 0.9 % WEIGHT BASED INFUSION
1.0000 mL/kg/h | INTRAVENOUS | Status: AC
  Administered 2019-09-26: 1 mL/kg/h via INTRAVENOUS

## 2019-09-26 MED ORDER — SODIUM CHLORIDE 0.9 % WEIGHT BASED INFUSION
1.0000 mL/kg/h | INTRAVENOUS | Status: DC
  Administered 2019-09-26: 1 mL/kg/h via INTRAVENOUS

## 2019-09-26 MED ORDER — METOPROLOL TARTRATE 12.5 MG HALF TABLET
12.5000 mg | ORAL_TABLET | Freq: Once | ORAL | Status: AC
  Administered 2019-09-27: 12.5 mg via ORAL
  Filled 2019-09-26: qty 1

## 2019-09-26 MED ORDER — ISOSORBIDE MONONITRATE ER 30 MG PO TB24
30.0000 mg | ORAL_TABLET | Freq: Every day | ORAL | Status: DC
  Administered 2019-09-26: 30 mg via ORAL
  Filled 2019-09-26: qty 1

## 2019-09-26 MED ORDER — ASPIRIN 81 MG PO CHEW: 81.0000 mg | CHEWABLE_TABLET | ORAL | Status: DC

## 2019-09-26 MED ORDER — NITROGLYCERIN 1 MG/10 ML FOR IR/CATH LAB
INTRA_ARTERIAL | Status: AC
  Filled 2019-09-26: qty 10

## 2019-09-26 MED ORDER — SODIUM CHLORIDE 0.9% FLUSH: 3.0000 mL | Freq: Two times a day (BID) | INTRAVENOUS | Status: DC

## 2019-09-26 MED ORDER — ASPIRIN EC 81 MG PO TBEC: 81.0000 mg | DELAYED_RELEASE_TABLET | Freq: Every day | ORAL | Status: DC

## 2019-09-26 MED ORDER — EPINEPHRINE HCL 5 MG/250ML IV SOLN IN NS
0.0000 ug/min | INTRAVENOUS | Status: DC
  Filled 2019-09-26: qty 250

## 2019-09-26 MED ORDER — TRANEXAMIC ACID (OHS) PUMP PRIME SOLUTION
2.0000 mg/kg | INTRAVENOUS | Status: DC
  Filled 2019-09-26: qty 1.5

## 2019-09-26 MED ORDER — ASPIRIN 300 MG RE SUPP
300.0000 mg | RECTAL | Status: AC
  Filled 2019-09-26: qty 1

## 2019-09-26 MED ORDER — VANCOMYCIN HCL 1250 MG/250ML IV SOLN
1250.0000 mg | INTRAVENOUS | Status: DC
  Filled 2019-09-26: qty 250

## 2019-09-26 MED ORDER — PLASMA-LYTE 148 IV SOLN
INTRAVENOUS | Status: DC
  Filled 2019-09-26: qty 2.5

## 2019-09-26 MED ORDER — ACETAMINOPHEN 325 MG PO TABS
650.0000 mg | ORAL_TABLET | ORAL | Status: DC | PRN
  Administered 2019-09-27: 650 mg via ORAL
  Filled 2019-09-26: qty 2

## 2019-09-26 MED ORDER — VERAPAMIL HCL 2.5 MG/ML IV SOLN
INTRAVENOUS | Status: AC
  Filled 2019-09-26: qty 2

## 2019-09-26 MED ORDER — ONDANSETRON HCL 4 MG/2ML IJ SOLN: 4.0000 mg | Freq: Four times a day (QID) | INTRAMUSCULAR | Status: DC | PRN

## 2019-09-26 MED ORDER — POTASSIUM CHLORIDE 2 MEQ/ML IV SOLN
80.0000 meq | INTRAVENOUS | Status: DC
  Filled 2019-09-26: qty 40

## 2019-09-26 MED ORDER — CHLORHEXIDINE GLUCONATE CLOTH 2 % EX PADS: 6.0000 | MEDICATED_PAD | Freq: Once | CUTANEOUS | Status: DC

## 2019-09-26 MED ORDER — SODIUM CHLORIDE 0.9% FLUSH: 3.0000 mL | INTRAVENOUS | Status: DC | PRN

## 2019-09-26 MED ORDER — IOHEXOL 350 MG/ML SOLN
INTRAVENOUS | Status: DC | PRN
  Administered 2019-09-26: 85 mL via INTRA_ARTERIAL

## 2019-09-26 MED ORDER — CHLORHEXIDINE GLUCONATE 0.12 % MT SOLN
15.0000 mL | Freq: Once | OROMUCOSAL | Status: AC
  Administered 2019-09-27: 15 mL via OROMUCOSAL
  Filled 2019-09-26: qty 15

## 2019-09-26 MED ORDER — MIDAZOLAM HCL 2 MG/2ML IJ SOLN
INTRAMUSCULAR | Status: AC
  Filled 2019-09-26: qty 2

## 2019-09-26 MED ORDER — SODIUM CHLORIDE 0.9% FLUSH: 3.0000 mL | Freq: Once | INTRAVENOUS | Status: DC

## 2019-09-26 MED ORDER — TRANEXAMIC ACID 1000 MG/10ML IV SOLN
1.5000 mg/kg/h | INTRAVENOUS | Status: AC
  Administered 2019-09-27: 1.5 mg/kg/h via INTRAVENOUS
  Filled 2019-09-26: qty 25

## 2019-09-26 MED ORDER — HEPARIN SODIUM (PORCINE) 1000 UNIT/ML IJ SOLN
INTRAMUSCULAR | Status: AC
  Filled 2019-09-26: qty 1

## 2019-09-26 MED ORDER — SODIUM CHLORIDE 0.9 % IV SOLN
1.5000 g | INTRAVENOUS | Status: AC
  Administered 2019-09-27: 1.5 g via INTRAVENOUS
  Administered 2019-09-27: .75 g via INTRAVENOUS
  Filled 2019-09-26: qty 1.5

## 2019-09-26 MED ORDER — SODIUM CHLORIDE 0.9 % IV SOLN: 250.0000 mL | INTRAVENOUS | Status: DC | PRN

## 2019-09-26 MED ORDER — SODIUM CHLORIDE 0.9 % WEIGHT BASED INFUSION
3.0000 mL/kg/h | INTRAVENOUS | Status: DC
  Administered 2019-09-26: 3 mL/kg/h via INTRAVENOUS

## 2019-09-26 MED ORDER — ASPIRIN 81 MG PO CHEW
324.0000 mg | CHEWABLE_TABLET | ORAL | Status: AC
  Administered 2019-09-26: 324 mg via ORAL
  Filled 2019-09-26: qty 4

## 2019-09-26 MED ORDER — MAGNESIUM SULFATE 50 % IJ SOLN
40.0000 meq | INTRAMUSCULAR | Status: DC
  Filled 2019-09-26: qty 9.85

## 2019-09-26 MED ORDER — ASPIRIN 81 MG PO CHEW
324.0000 mg | CHEWABLE_TABLET | Freq: Once | ORAL | Status: AC
  Administered 2019-09-26: 324 mg via ORAL
  Filled 2019-09-26: qty 4

## 2019-09-26 MED ORDER — MIDAZOLAM HCL 2 MG/2ML IJ SOLN
INTRAMUSCULAR | Status: DC | PRN
  Administered 2019-09-26: 1 mg via INTRAVENOUS

## 2019-09-26 MED ORDER — LIDOCAINE HCL (PF) 1 % IJ SOLN
INTRAMUSCULAR | Status: DC | PRN
  Administered 2019-09-26: 2 mL

## 2019-09-26 MED ORDER — PHENYLEPHRINE HCL-NACL 20-0.9 MG/250ML-% IV SOLN
30.0000 ug/min | INTRAVENOUS | Status: AC
  Administered 2019-09-27: 25 ug/min via INTRAVENOUS
  Filled 2019-09-26: qty 250

## 2019-09-26 MED ORDER — HEPARIN BOLUS VIA INFUSION
3000.0000 [IU] | Freq: Once | INTRAVENOUS | Status: AC
  Administered 2019-09-26: 3000 [IU] via INTRAVENOUS
  Filled 2019-09-26: qty 3000

## 2019-09-26 MED ORDER — HEPARIN (PORCINE) IN NACL 1000-0.9 UT/500ML-% IV SOLN
INTRAVENOUS | Status: AC
  Filled 2019-09-26: qty 1000

## 2019-09-26 MED ORDER — LIDOCAINE HCL (PF) 1 % IJ SOLN
INTRAMUSCULAR | Status: AC
  Filled 2019-09-26: qty 30

## 2019-09-26 MED ORDER — NITROGLYCERIN IN D5W 200-5 MCG/ML-% IV SOLN
2.0000 ug/min | INTRAVENOUS | Status: AC
  Administered 2019-09-27: 5 ug/min via INTRAVENOUS
  Filled 2019-09-26: qty 250

## 2019-09-26 MED ORDER — HEPARIN (PORCINE) IN NACL 1000-0.9 UT/500ML-% IV SOLN
INTRAVENOUS | Status: DC | PRN
  Administered 2019-09-26 (×2): 500 mL

## 2019-09-26 MED ORDER — ACETAMINOPHEN 325 MG PO TABS
650.0000 mg | ORAL_TABLET | ORAL | Status: DC | PRN
  Administered 2019-09-26: 650 mg via ORAL
  Filled 2019-09-26: qty 2

## 2019-09-26 MED ORDER — FENTANYL CITRATE (PF) 100 MCG/2ML IJ SOLN
INTRAMUSCULAR | Status: DC | PRN
  Administered 2019-09-26: 25 ug via INTRAVENOUS

## 2019-09-26 MED ORDER — SODIUM CHLORIDE 0.9 % IV SOLN
750.0000 mg | INTRAVENOUS | Status: DC
  Filled 2019-09-26: qty 750

## 2019-09-26 MED ORDER — LABETALOL HCL 5 MG/ML IV SOLN: 10.0000 mg | INTRAVENOUS | Status: AC | PRN

## 2019-09-26 MED ORDER — NITROGLYCERIN 0.4 MG SL SUBL: 0.4000 mg | SUBLINGUAL_TABLET | SUBLINGUAL | Status: DC | PRN

## 2019-09-26 MED ORDER — MILRINONE LACTATE IN DEXTROSE 20-5 MG/100ML-% IV SOLN
0.3000 ug/kg/min | INTRAVENOUS | Status: DC
  Filled 2019-09-26: qty 100

## 2019-09-26 MED ORDER — MUPIROCIN 2 % EX OINT: 1.0000 "application " | TOPICAL_OINTMENT | Freq: Two times a day (BID) | CUTANEOUS | Status: DC

## 2019-09-26 MED ORDER — HYDRALAZINE HCL 20 MG/ML IJ SOLN: 10.0000 mg | INTRAMUSCULAR | Status: AC | PRN

## 2019-09-26 MED ORDER — VERAPAMIL HCL 2.5 MG/ML IV SOLN
INTRAVENOUS | Status: DC | PRN
  Administered 2019-09-26: 10 mL via INTRA_ARTERIAL

## 2019-09-26 MED ORDER — INSULIN REGULAR(HUMAN) IN NACL 100-0.9 UT/100ML-% IV SOLN
INTRAVENOUS | Status: AC
  Administered 2019-09-27: .4 [IU]/h via INTRAVENOUS
  Filled 2019-09-26: qty 100

## 2019-09-26 MED ORDER — IRBESARTAN 300 MG PO TABS
300.0000 mg | ORAL_TABLET | Freq: Every evening | ORAL | Status: DC
  Administered 2019-09-26: 300 mg via ORAL
  Filled 2019-09-26: qty 1

## 2019-09-26 MED ORDER — TRANEXAMIC ACID (OHS) BOLUS VIA INFUSION
15.0000 mg/kg | INTRAVENOUS | Status: AC
  Administered 2019-09-27: 1122 mg via INTRAVENOUS
  Filled 2019-09-26: qty 1122

## 2019-09-26 MED ORDER — DEXMEDETOMIDINE HCL IN NACL 400 MCG/100ML IV SOLN
0.1000 ug/kg/h | INTRAVENOUS | Status: AC
  Administered 2019-09-27: .3 ug/kg/h via INTRAVENOUS
  Filled 2019-09-26: qty 100

## 2019-09-26 MED ORDER — SODIUM CHLORIDE 0.9 % IV SOLN
INTRAVENOUS | Status: DC
  Filled 2019-09-26: qty 30

## 2019-09-26 MED ORDER — HEPARIN (PORCINE) 25000 UT/250ML-% IV SOLN
1200.0000 [IU]/h | INTRAVENOUS | Status: DC
  Administered 2019-09-26: 900 [IU]/h via INTRAVENOUS
  Filled 2019-09-26: qty 250

## 2019-09-26 MED ORDER — CARVEDILOL 12.5 MG PO TABS
12.5000 mg | ORAL_TABLET | Freq: Two times a day (BID) | ORAL | Status: DC
  Administered 2019-09-26 (×2): 12.5 mg via ORAL
  Filled 2019-09-26 (×3): qty 1

## 2019-09-26 MED ORDER — HEPARIN SODIUM (PORCINE) 1000 UNIT/ML IJ SOLN
INTRAMUSCULAR | Status: DC | PRN
  Administered 2019-09-26: 4000 [IU] via INTRAVENOUS

## 2019-09-26 MED ORDER — FENTANYL CITRATE (PF) 100 MCG/2ML IJ SOLN
INTRAMUSCULAR | Status: AC
  Filled 2019-09-26: qty 2

## 2019-09-26 SURGICAL SUPPLY — 10 items
CATH 5FR JL3.5 JR4 ANG PIG MP (CATHETERS) ×1 IMPLANT
DEVICE RAD COMP TR BAND LRG (VASCULAR PRODUCTS) ×1 IMPLANT
GLIDESHEATH SLEND SS 6F .021 (SHEATH) ×1 IMPLANT
GUIDEWIRE INQWIRE 1.5J.035X260 (WIRE) IMPLANT
INQWIRE 1.5J .035X260CM (WIRE) ×2
KIT HEART LEFT (KITS) ×2 IMPLANT
PACK CARDIAC CATHETERIZATION (CUSTOM PROCEDURE TRAY) ×2 IMPLANT
SYR MEDRAD MARK 7 150ML (SYRINGE) ×2 IMPLANT
TRANSDUCER W/STOPCOCK (MISCELLANEOUS) ×2 IMPLANT
TUBING CIL FLEX 10 FLL-RA (TUBING) ×2 IMPLANT

## 2019-09-26 NOTE — Interval H&P Note (Signed)
History and Physical Interval Note:  09/26/2019 7:28 AM  [place]  has presented today for surgery, with the diagnosis of NSTEMI.  The various methods of treatment have been discussed with the patient and family. After consideration of risks, benefits and other options for treatment, the patient has consented to  Procedure(s): LEFT HEART CATH AND CORONARY ANGIOGRAPHY (N/A) as a surgical intervention.  The patient's history has been reviewed, patient examined, no change in status, stable for surgery.  I have reviewed the patient's chart and labs.  Questions were answered to the patient's satisfaction.   Cath Lab Visit (complete for each Cath Lab visit)  Clinical Evaluation Leading to the Procedure:   ACS: Yes.    Non-ACS:    Anginal Classification: CCS IV  Anti-ischemic medical therapy: No Therapy  Non-Invasive Test Results: No non-invasive testing performed  Prior CABG: No previous CABG        [facility] 09/26/2019 7:28 AM

## 2019-09-26 NOTE — Progress Notes (Signed)
ANTICOAGULATION CONSULT NOTE - Follow Up Consult  Pharmacy Consult for heparin Indication: CAD awaiting CABG  Labs: Recent Labs    09/26/19 0047 09/26/19 0047 09/26/19 0327 09/26/19 0612 09/26/19 6168 09/26/19 0655 09/26/19 0943 09/26/19 2228  HGB 14.8   < >  --  14.9  --  14.3  --   --   HCT 44.1  --   --  44.0  --  41.9  --   --   PLT 360  --   --  334  --  330  --   --   APTT  --   --   --   --   --   --   --  46*  LABPROT  --   --   --   --  12.9  --   --  13.3  INR  --   --   --   --  1.0  --   --  1.1  HEPARINUNFRC  --   --   --   --   --   --   --  <0.10*  CREATININE 1.02  --   --   --  0.85  --   --  0.94  TROPONINIHS 776*   < > 4,098*  --  9,775*  --  10,113*  --    < > = values in this interval not displayed.    Assessment: 59yo male subtherapeutic on heparin with initial dosing s/p cath; no gtt issues or signs of bleeding per RN.  Goal of Therapy:  Heparin level 0.3-0.7 units/ml   Plan:  Will increase heparin gtt by 4 units/kg/hr to 1200 units/hr until off for OR in am.    Wynona Neat, PharmD, BCPS  09/26/2019,11:47 PM

## 2019-09-26 NOTE — ED Provider Notes (Signed)
Emergency Department Provider Note   I have reviewed the triage vital signs and the nursing notes.   HISTORY  Chief Complaint Chest Pain and Jaw Pain   HPI Evan Rivera is a 59 y.o. male with past medical history of hypertension and hyperlipidemia presents to the emergency department with worsening pain in his chest radiating to his jaw. Patient has had several years on mild symptoms but pain is worsening significantly over the last several days. Notes some mild SOB. No fever or chills. No radiation of symptoms or modifying factors. No new meds. No provoking factors.    Past Medical History:  Diagnosis Date  . Allergy    seasonal  . GERD (gastroesophageal reflux disease)   . Hyperlipidemia    no meds   . Hypertension     Patient Active Problem List   Diagnosis Date Noted  . 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 NSTEMI (non-ST elevated myocardial infarction) ([place]) 09/26/2019  . Essential hypertension 09/22/2016  . GERD (gastroesophageal reflux disease) 09/22/2016  . Hyperlipidemia 09/22/2016    Past Surgical History:  Procedure Laterality Date  . COLONOSCOPY     > 10 yeras ago- normal per pt.   . CORONARY ARTERY BYPASS GRAFT N/A 09/27/2019   Procedure: CORONARY ARTERY BYPASS GRAFTING (CABG), ON PUMP, TIMES 4, USING LEFT INTERNAL MAMMARY ARTERY, LEFT RADIAL ARTERY, AND ENDOSCOPICALLY HARVESTED RIGHT GREATER SAPHENOUS VEIN. LIMA to LAD, LEFT RADIAL ARTERY to OM1, SVG to DIAG 1, SVG to PLB;  Surgeon: Grace Isaac, MD;  Location: [facility];  Service: Open Heart Surgery;  Laterality: N/A;  . ENDOVEIN HARVEST OF GREATER SAPHENOUS VEIN Right 09/27/2019   Procedure: ENDOVEIN HARVEST OF GREATER SAPHENOUS VEIN;  Surgeon: Grace Isaac, MD;  Location: [facility];  Service: Open Heart Surgery;  Laterality: Right;  . HERNIA REPAIR     umbilical   . LEFT HEART CATH AND CORONARY ANGIOGRAPHY N/A 09/26/2019   Procedure: LEFT HEART CATH AND CORONARY ANGIOGRAPHY;  Surgeon: Martinique, Peter M, MD;   Location: [facility];  Service: Cardiovascular;  Laterality: N/A;  . RADIAL ARTERY HARVEST Left 09/27/2019   Procedure: RADIAL ARTERY HARVEST;  Surgeon: Grace Isaac, MD;  Location: [facility];  Service: Open Heart Surgery;  Laterality: Left;  . RESECTION OF MEDIASTINAL MASS N/A 09/27/2019   Procedure: RESECTION OF INCIDENTAL ANTERIOR  MEDIASTINAL MASS;  Surgeon: Grace Isaac, MD;  Location: [facility];  Service: Open Heart Surgery;  Laterality: N/A;  . TEE WITHOUT CARDIOVERSION N/A 09/27/2019   Procedure: TRANSESOPHAGEAL ECHOCARDIOGRAM (TEE);  Surgeon: Grace Isaac, MD;  Location: [facility];  Service: Open Heart Surgery;  Laterality: N/A;  . TONSILLECTOMY      Allergies Statins and Drug [tape]  Family History  Problem Relation Age of Onset  . Prostate cancer Father        late 22's  . Hyperlipidemia Father   . Hypertension Father   . Heart disease Mother   . Colon cancer Neg Hx   . Colon polyps Neg Hx   . Esophageal cancer Neg Hx   . Rectal cancer Neg Hx   . Stomach cancer Neg Hx     Social History Social History   Tobacco Use  . Smoking status: Former Smoker    Types: Cigarettes    Quit date: 06/23/1989    Years since quitting: 30.2  . Smokeless tobacco: Former Network engineer Use Topics  . Alcohol use: No  . Drug use: No  Review of Systems  Constitutional: No fever/chills Eyes: No visual changes. ENT: No sore throat. Cardiovascular: Positive chest pain. Respiratory: Denies shortness of breath. Gastrointestinal: No abdominal pain. Positive nausea, no vomiting.  No diarrhea.  No constipation. Genitourinary: Negative for dysuria. Musculoskeletal: Negative for back pain. Skin: Negative for rash. Neurological: Negative for headaches, focal weakness or numbness.  10-point ROS otherwise negative.  ____________________________________________   PHYSICAL EXAM:  VITAL SIGNS: ED Triage Vitals  Enc Vitals Group     BP 09/26/19 0030 (!) 134/97      Pulse Rate 09/26/19 0030 66     Resp 09/26/19 0030 18     Temp 09/26/19 0030 98.6 F (37 C)     Temp Source 09/26/19 0030 Oral     SpO2 09/26/19 0030 100 %     Weight 09/26/19 0030 165 lb (74.8 kg)     Height 09/26/19 0030 5\' 4"  (1.626 m)   Constitutional: Alert and oriented. Well appearing and in no acute distress. Eyes: Conjunctivae are normal.  Head: Atraumatic. Nose: No congestion/rhinnorhea. Mouth/Throat: Mucous membranes are moist.   Neck: No stridor.   Cardiovascular: Normal rate, regular rhythm. Good peripheral circulation. Grossly normal heart sounds.   Respiratory: Normal respiratory effort.  No retractions. Lungs CTAB. Gastrointestinal: Soft and nontender. No distention.  Musculoskeletal: No gross deformities of extremities. Neurologic:  Normal speech and language.  Skin:  Skin is warm, dry and intact. No rash noted.  ____________________________________________   LABS (all labs ordered are listed, but only abnormal results are displayed)  Labs Reviewed  BASIC METABOLIC PANEL - Abnormal; Notable for the following components:      Result Value   Glucose, Bld 117 (*)    All other components within normal limits  CBC - Abnormal; Notable for the following components:   WBC 11.0 (*)    All other components within normal limits  COMPREHENSIVE METABOLIC PANEL - Abnormal; Notable for the following components:   Glucose, Bld 101 (*)    Total Protein 6.3 (*)    AST 60 (*)    All other components within normal limits  LIPID PANEL - Abnormal; Notable for the following components:   Triglycerides 164 (*)    HDL 40 (*)    LDL Cholesterol 118 (*)    All other components within normal limits  HEPARIN LEVEL (UNFRACTIONATED) - Abnormal; Notable for the following components:   Heparin Unfractionated <0.10 (*)    All other components within normal limits  APTT - Abnormal; Notable for the following components:   aPTT 46 (*)    All other components within normal limits  BASIC  METABOLIC PANEL - Abnormal; Notable for the following components:   Glucose, Bld 101 (*)    Calcium 8.8 (*)    All other components within normal limits  CBC - Abnormal; Notable for the following components:   RBC 4.14 (*)    Hemoglobin 12.6 (*)    HCT 38.0 (*)    All other components within normal limits  BASIC METABOLIC PANEL - Abnormal; Notable for the following components:   Glucose, Bld 104 (*)    Calcium 8.7 (*)    All other components within normal limits  HEMOGLOBIN AND HEMATOCRIT, BLOOD - Abnormal; Notable for the following components:   Hemoglobin 9.2 (*)    HCT 27.4 (*)    All other components within normal limits  CBC - Abnormal; Notable for the following components:   WBC 12.7 (*)    RBC 3.66 (*)  Hemoglobin 11.0 (*)    HCT 32.9 (*)    All other components within normal limits  PROTIME-INR - Abnormal; Notable for the following components:   Prothrombin Time 15.7 (*)    INR 1.3 (*)    All other components within normal limits  GLUCOSE, CAPILLARY - Abnormal; Notable for the following components:   Glucose-Capillary 103 (*)    All other components within normal limits  BASIC METABOLIC PANEL - Abnormal; Notable for the following components:   CO2 21 (*)    Glucose, Bld 114 (*)    Calcium 8.2 (*)    All other components within normal limits  CBC - Abnormal; Notable for the following components:   WBC 11.0 (*)    RBC 3.67 (*)    Hemoglobin 11.0 (*)    HCT 33.1 (*)    All other components within normal limits  CBC - Abnormal; Notable for the following components:   WBC 10.6 (*)    RBC 3.43 (*)    Hemoglobin 10.6 (*)    HCT 31.0 (*)    All other components within normal limits  BASIC METABOLIC PANEL - Abnormal; Notable for the following components:   Glucose, Bld 112 (*)    Calcium 8.1 (*)    All other components within normal limits  GLUCOSE, CAPILLARY - Abnormal; Notable for the following components:   Glucose-Capillary 101 (*)    All other components  within normal limits  GLUCOSE, CAPILLARY - Abnormal; Notable for the following components:   Glucose-Capillary 117 (*)    All other components within normal limits  GLUCOSE, CAPILLARY - Abnormal; Notable for the following components:   Glucose-Capillary 125 (*)    All other components within normal limits  GLUCOSE, CAPILLARY - Abnormal; Notable for the following components:   Glucose-Capillary 116 (*)    All other components within normal limits  BASIC METABOLIC PANEL - Abnormal; Notable for the following components:   Potassium 2.7 (*)    Chloride 118 (*)    CO2 17 (*)    Creatinine, Ser 0.38 (*)    Calcium 5.2 (*)    Anion gap 4 (*)    All other components within normal limits  MAGNESIUM - Abnormal; Notable for the following components:   Magnesium 1.2 (*)    All other components within normal limits  CBC - Abnormal; Notable for the following components:   RBC 2.42 (*)    Hemoglobin 7.3 (*)    HCT 22.5 (*)    Platelets 142 (*)    All other components within normal limits  GLUCOSE, CAPILLARY - Abnormal; Notable for the following components:   Glucose-Capillary 102 (*)    All other components within normal limits  GLUCOSE, CAPILLARY - Abnormal; Notable for the following components:   Glucose-Capillary 110 (*)    All other components within normal limits  BASIC METABOLIC PANEL - Abnormal; Notable for the following components:   Sodium 131 (*)    Glucose, Bld 101 (*)    Calcium 8.5 (*)    All other components within normal limits  CBC - Abnormal; Notable for the following components:   WBC 13.4 (*)    RBC 3.29 (*)    Hemoglobin 10.4 (*)    HCT 30.4 (*)    All other components within normal limits  GLUCOSE, CAPILLARY - Abnormal; Notable for the following components:   Glucose-Capillary 101 (*)    All other components within normal limits  GLUCOSE, CAPILLARY - Abnormal; Notable for  the following components:   Glucose-Capillary 105 (*)    All other components within normal  limits  GLUCOSE, CAPILLARY - Abnormal; Notable for the following components:   Glucose-Capillary 138 (*)    All other components within normal limits  POCT I-STAT, CHEM 8 - Abnormal; Notable for the following components:   Creatinine, Ser 0.60 (*)    Hemoglobin 10.5 (*)    HCT 31.0 (*)    All other components within normal limits  POCT I-STAT 7, (LYTES, BLD GAS, ICA,H+H) - Abnormal; Notable for the following components:   pH, Arterial 7.334 (*)    pO2, Arterial 464 (*)    HCT 33.0 (*)    Hemoglobin 11.2 (*)    All other components within normal limits  POCT I-STAT, CHEM 8 - Abnormal; Notable for the following components:   Creatinine, Ser 0.60 (*)    Glucose, Bld 107 (*)    Hemoglobin 11.2 (*)    HCT 33.0 (*)    All other components within normal limits  POCT I-STAT, CHEM 8 - Abnormal; Notable for the following components:   Creatinine, Ser 0.50 (*)    Calcium, Ion 0.99 (*)    Hemoglobin 8.2 (*)    HCT 24.0 (*)    All other components within normal limits  POCT I-STAT 7, (LYTES, BLD GAS, ICA,H+H) - Abnormal; Notable for the following components:   pO2, Arterial 332 (*)    Calcium, Ion 0.97 (*)    HCT 25.0 (*)    Hemoglobin 8.5 (*)    All other components within normal limits  POCT I-STAT, CHEM 8 - Abnormal; Notable for the following components:   Glucose, Bld 149 (*)    Hemoglobin 8.5 (*)    HCT 25.0 (*)    All other components within normal limits  POCT I-STAT, CHEM 8 - Abnormal; Notable for the following components:   Creatinine, Ser 0.50 (*)    Glucose, Bld 132 (*)    Hemoglobin 8.5 (*)    HCT 25.0 (*)    All other components within normal limits  POCT I-STAT, CHEM 8 - Abnormal; Notable for the following components:   Creatinine, Ser 0.60 (*)    Glucose, Bld 120 (*)    Hemoglobin 8.8 (*)    HCT 26.0 (*)    All other components within normal limits  POCT I-STAT 7, (LYTES, BLD GAS, ICA,H+H) - Abnormal; Notable for the following components:   pH, Arterial 7.344 (*)     HCT 31.0 (*)    Hemoglobin 10.5 (*)    All other components within normal limits  POCT I-STAT 7, (LYTES, BLD GAS, ICA,H+H) - Abnormal; Notable for the following components:   Acid-base deficit 4.0 (*)    HCT 29.0 (*)    Hemoglobin 9.9 (*)    All other components within normal limits  TROPONIN I (HIGH SENSITIVITY) - Abnormal; Notable for the following components:   Troponin I (High Sensitivity) 776 (*)    All other components within normal limits  TROPONIN I (HIGH SENSITIVITY) - Abnormal; Notable for the following components:   Troponin I (High Sensitivity) 4,098 (*)    All other components within normal limits  TROPONIN I (HIGH SENSITIVITY) - Abnormal; Notable for the following components:   Troponin I (High Sensitivity) 10,113 (*)    All other components within normal limits  TROPONIN I (HIGH SENSITIVITY) - Abnormal; Notable for the following components:   Troponin I (High Sensitivity) 9,775 (*)    All other components  within normal limits  SARS CORONAVIRUS 2 BY RT PCR (HOSPITAL ORDER, [facility])  SURGICAL PCR SCREEN  HIV ANTIBODY (ROUTINE TESTING W REFLEX)  TSH  HEMOGLOBIN A1C  CBC WITH DIFFERENTIAL/PLATELET  MAGNESIUM  PROTIME-INR  CBC WITH DIFFERENTIAL/PLATELET  PROTIME-INR  URINALYSIS, ROUTINE W REFLEX MICROSCOPIC  HEMOGLOBIN A1C  BLOOD GAS, ARTERIAL  PLATELET COUNT  APTT  GLUCOSE, CAPILLARY  MAGNESIUM  MAGNESIUM  GLUCOSE, CAPILLARY  GLUCOSE, CAPILLARY  GLUCOSE, CAPILLARY  GLUCOSE, CAPILLARY  GLUCOSE, CAPILLARY  BLOOD GAS, ARTERIAL  ABO/RH  TYPE AND SCREEN  PREPARE RBC (CROSSMATCH)  SURGICAL PATHOLOGY   ____________________________________________  EKG   EKG Interpretation  Date/Time:  Monday September 26 2019 05:48:39 EDT Ventricular Rate:  78 PR Interval:  140 QRS Duration: 112 QT Interval:  374 QTC Calculation: 426 R Axis:   30 Text Interpretation: Sinus rhythm Borderline intraventricular conduction delay Borderline T  abnormalities, lateral leads No STEMI Confirmed by Nanda Quinton [phone]) on 09/26/2019 5:53:12 AM Also confirmed by Nanda Quinton [phone]), editor Victory Dakin [phone])  on 09/27/2019 7:03:53 AM       ____________________________________________  RADIOLOGY  DG Chest 2 View  Result Date: 09/26/2019 CLINICAL DATA:  Chest pain EXAM: CHEST - 2 VIEW COMPARISON:  09/09/2019 FINDINGS: The heart size and mediastinal contours are within normal limits. Both lungs are clear. The visualized skeletal structures are unremarkable. IMPRESSION: No acute abnormality noted. Previously seen density is felt to represent summation of bony shadows not well visualized on the current exam. Electronically Signed   By: Inez Catalina M.D.   On: 09/26/2019 01:10    ____________________________________________   PROCEDURES  Procedure(s) performed:   Procedures  CRITICAL CARE Performed by: Margette Fast Total critical care time: 35 minutes Critical care time was exclusive of separately billable procedures and treating other patients. Critical care was necessary to treat or prevent imminent or life-threatening deterioration. Critical care was time spent personally by me on the following activities: development of treatment plan with patient and/or surrogate as well as nursing, discussions with consultants, evaluation of patient's response to treatment, examination of patient, obtaining history from patient or surrogate, ordering and performing treatments and interventions, ordering and review of laboratory studies, ordering and review of radiographic studies, pulse oximetry and re-evaluation of patient's condition.  Nanda Quinton, MD Emergency Medicine  ____________________________________________   INITIAL IMPRESSION / ASSESSMENT AND PLAN / ED COURSE  Pertinent labs & imaging results that were available during my care of the patient were reviewed by me and considered in my medical decision making (see chart for  details).   Patient with history concerning for ACS. Has been seen recently this month with CP and troponins are negative. Initial troponin is low but second troponin significantly elevated concerning for NSTEMI. EKG interpreted by me as above. Will start heparin and discuss case with Cardiology.   Discussed patient's case with Cardiology, Dr. Radford Pax to request admission. Patient and family (if present) updated with plan. Care transferred to Cardiology service.  I reviewed all nursing notes, vitals, pertinent old records, EKGs, labs, imaging (as available).    ____________________________________________  FINAL CLINICAL IMPRESSION(S) / ED DIAGNOSES  Final diagnoses:  NSTEMI (non-ST elevated myocardial infarction) ([place])     MEDICATIONS GIVEN DURING THIS VISIT:  Medications  labetalol (NORMODYNE) injection 10 mg (has no administration in time range)  hydrALAZINE (APRESOLINE) injection 10 mg (has no administration in time range)  0.9% sodium chloride infusion (1 mL/kg/hr  74.8 kg Intravenous New Bag/Given 09/26/19  1609)  0.45 % sodium chloride infusion ( Intravenous Stopped 09/28/19 1021)  lactated ringers infusion ( Intravenous Duplicate 05/29/49 8841)  lactated ringers infusion ( Intravenous Stopped 09/28/19 1021)  sodium chloride flush (NS) 0.9 % injection 3 mL (3 mLs Intravenous Given 09/29/19 0035)  sodium chloride flush (NS) 0.9 % injection 3 mL (has no administration in time range)  0.9 %  sodium chloride infusion (0 mLs Intravenous Duplicate 6/60/63 0160)  0.9 %  sodium chloride infusion ( Intravenous Not Given 09/27/19 1534)  phenylephrine (NEOSYNEPHRINE) 20-0.9 MG/250ML-% infusion (0 mcg/min Intravenous Stopped 09/28/19 1021)  lactated ringers infusion 500 mL (has no administration in time range)  albumin human 5 % solution 12.5 g (12.5 g Intravenous New Bag/Given 09/27/19 1547)  acetaminophen (TYLENOL) tablet 1,000 mg (1,000 mg Oral Given 09/29/19 0512)    Or  acetaminophen  (TYLENOL) 160 MG/5ML solution 1,000 mg ( Per Tube See Alternative 09/29/19 0512)  traMADol (ULTRAM) tablet 50-100 mg (has no administration in time range)  oxyCODONE (Oxy IR/ROXICODONE) immediate release tablet 5-10 mg (10 mg Oral Given 09/29/19 0512)  morphine 2 MG/ML injection 1-4 mg (2 mg Intravenous Given 09/28/19 1528)  midazolam (VERSED) injection 2 mg (2 mg Intravenous Given 09/27/19 1558)  docusate sodium (COLACE) capsule 200 mg (200 mg Oral Given 09/28/19 0851)  bisacodyl (DULCOLAX) EC tablet 10 mg (10 mg Oral Given 09/28/19 0852)    Or  bisacodyl (DULCOLAX) suppository 10 mg ( Rectal See Alternative 09/28/19 0852)  ondansetron (ZOFRAN) injection 4 mg (has no administration in time range)  aspirin EC tablet 325 mg (325 mg Oral Given 09/28/19 0850)    Or  aspirin chewable tablet 324 mg ( Per Tube See Alternative 09/28/19 0850)  metoprolol tartrate (LOPRESSOR) injection 2.5-5 mg (has no administration in time range)  metoprolol tartrate (LOPRESSOR) tablet 12.5 mg (12.5 mg Oral Given 09/28/19 2128)    Or  metoprolol tartrate (LOPRESSOR) 25 mg/10 mL oral suspension 12.5 mg ( Per Tube See Alternative 09/28/19 2128)  famotidine (PEPCID) IVPB 20 mg premix (20 mg Intravenous Not Given 09/27/19 2321)  pantoprazole (PROTONIX) EC tablet 40 mg (has no administration in time range)  dexmedetomidine (PRECEDEX) 400 MCG/100ML (4 mcg/mL) infusion (0 mcg/kg/hr  72.5 kg Intravenous Stopped 09/28/19 0553)  nitroGLYCERIN 50 mg in dextrose 5 % 250 mL (0.2 mg/mL) infusion (0 mcg/min Intravenous Stopped 09/28/19 1022)  isosorbide mononitrate (IMDUR) 24 hr tablet 30 mg (30 mg Oral Given 09/28/19 0851)  Chlorhexidine Gluconate Cloth 2 % PADS 6 each (0 each Topical Duplicate 03/11/30 3557)  sodium chloride flush (NS) 0.9 % injection 10-40 mL (10 mLs Intracatheter Given 09/29/19 0035)  sodium chloride flush (NS) 0.9 % injection 10-40 mL (has no administration in time range)  Chlorhexidine Gluconate Cloth 2 % PADS 6 each (0  each Topical Duplicate 05/22/00 5427)  sodium chloride flush (NS) 0.9 % injection 10-40 mL (10 mLs Intracatheter Given 09/29/19 0035)  sodium chloride flush (NS) 0.9 % injection 10-40 mL (has no administration in time range)  enoxaparin (LOVENOX) injection 40 mg (40 mg Subcutaneous Given 09/28/19 2127)  insulin aspart (novoLOG) injection 0-24 Units (0 Units Subcutaneous Not Given 09/29/19 0645)  aspirin chewable tablet 324 mg (324 mg Oral Given 09/26/19 0346)  heparin bolus via infusion 3,000 Units (3,000 Units Intravenous Bolus from Bag 09/26/19 0352)  aspirin chewable tablet 324 mg (324 mg Oral Given 09/26/19 2138)    Or  aspirin suppository 300 mg ( Rectal See Alternative 09/26/19 2138)  dexmedetomidine (PRECEDEX) 400 MCG/100ML (  4 mcg/mL) infusion (0.7 mcg/kg/hr  74.8 kg Intravenous Rate/Dose Change 09/27/19 1146)  insulin regular, human (MYXREDLIN) 100 units/ 100 mL infusion (1 Units/hr Intravenous Rate/Dose Change 09/27/19 1238)  nitroGLYCERIN 50 mg in dextrose 5 % 250 mL (0.2 mg/mL) infusion (10 mcg/min Intravenous Rate/Dose Change 09/27/19 1327)  phenylephrine (NEOSYNEPHRINE) 20-0.9 MG/250ML-% infusion (25 mcg/min Intravenous Rate/Dose Change 09/27/19 1342)  tranexamic acid (CYKLOKAPRON) bolus via infusion - over 30 minutes 1,122 mg (1,122 mg Intravenous Given 09/27/19 0805)  tranexamic acid (CYKLOKAPRON) 2,500 mg in sodium chloride 0.9 % 250 mL (10 mg/mL) infusion (1.5 mg/kg/hr  74.8 kg Intravenous New Bag/Given 09/27/19 0834)  cefUROXime (ZINACEF) 1.5 g in sodium chloride 0.9 % 100 mL IVPB (0.75 g Intravenous Bolus 09/27/19 1254)  chlorhexidine (PERIDEX) 0.12 % solution 15 mL (15 mLs Mouth/Throat Given 09/27/19 0448)  bisacodyl (DULCOLAX) EC tablet 5 mg (5 mg Oral Given 09/26/19 2138)  temazepam (RESTORIL) capsule 15 mg (15 mg Oral Given 09/26/19 2138)  metoprolol tartrate (LOPRESSOR) tablet 12.5 mg (12.5 mg Oral Given 09/27/19 0316)  Chlorhexidine Gluconate Cloth 2 % PADS 6 each (6 each Topical Given  09/26/19 2139)  potassium chloride 10 mEq in 50 mL *CENTRAL LINE* IVPB (10 mEq Intravenous New Bag/Given 09/27/19 1708)  magnesium sulfate IVPB 4 g 100 mL (4 g Intravenous New Bag/Given 09/27/19 1430)  cefUROXime (ZINACEF) 1.5 g in sodium chloride 0.9 % 100 mL IVPB (1.5 g Intravenous New Bag/Given 09/29/19 0515)  vancomycin (VANCOCIN) IVPB 1000 mg/200 mL premix ( Intravenous Stopped 09/27/19 2053)  acetaminophen (TYLENOL) 160 MG/5ML solution 650 mg ( Per Tube See Alternative 09/27/19 1548)    Or  acetaminophen (TYLENOL) suppository 650 mg (650 mg Rectal Given 09/27/19 1548)  chlorhexidine (PERIDEX) 0.12 % solution 15 mL (15 mLs Mouth/Throat Given 09/27/19 1430)  potassium chloride 10 mEq in 50 mL *CENTRAL LINE* IVPB ( Intravenous Rate/Dose Verify 09/29/19 0000)  magnesium sulfate IVPB 4 g 100 mL ( Intravenous Rate/Dose Verify 09/28/19 2000)  calcium chloride 2 g in sodium chloride 0.9 % 100 mL IVPB ( Intravenous Stopped 09/28/19 2139)    Note:  This document was prepared using Dragon voice recognition software and may include unintentional dictation errors.  Nanda Quinton, MD, [facility] Emergency Medicine    Bodi Palmeri, [facility], MD 09/29/19 [phone]

## 2019-09-26 NOTE — Progress Notes (Signed)
ANTICOAGULATION CONSULT NOTE - Follow Up Consult  Pharmacy Consult for heparin Indication: chest pain/ACS  Allergies  Allergen Reactions   Statins Anaphylaxis    Pt does not remember which statin caused the reaction   Drug [Tape] Hives    Reaction to some forms of bandaids - please use paper tape    Patient Measurements: Height: 5\' 4"  (162.6 cm) Weight: 74.8 kg (165 lb) IBW/kg (Calculated) : 59.2  Vital Signs: Temp: 98.6 F (37 C) (07/26 0030) Temp Source: Oral (07/26 0030) BP: 131/89 (07/26 0900) Pulse Rate: 71 (07/26 0900)  Labs: Recent Labs    09/26/19 0047 09/26/19 0047 09/26/19 0327 09/26/19 0612 09/26/19 0653 09/26/19 0655  HGB 14.8   < >  --  14.9  --  14.3  HCT 44.1  --   --  44.0  --  41.9  PLT 360  --   --  334  --  330  LABPROT  --   --   --   --  12.9  --   INR  --   --   --   --  1.0  --   CREATININE 1.02  --   --   --  0.85  --   TROPONINIHS 776*  --  4,098*  --  9,775*  --    < > = values in this interval not displayed.    Estimated Creatinine Clearance: 85.5 mL/min (by C-G formula based on SCr of 0.85 mg/dL).   Medical History: Past Medical History:  Diagnosis Date   Allergy    seasonal   GERD (gastroesophageal reflux disease)    Hyperlipidemia    no meds    Hypertension     Assessment: 59 yo male admitted on 7/26 with chest pain that has worsened from baseline, troponin found to be elevated, and was started on IV heparin.   Taken for [facility] on 7/26 and was found to have severe 3 vessel obstructive CAD. Plan to undergo CABG workup with CT surgery.  Pharmacy has been asked to resume IV heparin 8 hours after sheath removal. Removed at 0800 per cath procedure log.  Goal of Therapy:  Heparin level 0.3-0.7 units/ml Monitor platelets by anticoagulation protocol: Yes   Plan:  Resume heparin 900 units/hr at 1600  Check 6 hour heparin level after restarting Daily heparin level and CBC  Vertis Kelch, PharmD, BCPS Phone [phone] 09/26/2019       9:41 AM  Please check [email or web address] for unit-specific pharmacist phone numbers

## 2019-09-26 NOTE — Progress Notes (Signed)
ANTICOAGULATION CONSULT NOTE - Initial Consult  Pharmacy Consult for heparin Indication: chest pain/ACS  Allergies  Allergen Reactions  . Statins Anaphylaxis    Pt does not remember which statin caused the reaction  . Drug [Tape] Hives    Reaction to some forms of bandaids - please use paper tape    Patient Measurements: Height: 5\' 4"  (162.6 cm) Weight: 74.8 kg (165 lb) IBW/kg (Calculated) : 59.2  Vital Signs: Temp: 98.6 F (37 C) (07/26 0030) Temp Source: Oral (07/26 0030) BP: 133/83 (07/26 0244) Pulse Rate: 65 (07/26 0244)  Labs: Recent Labs    09/26/19 0047  HGB 14.8  HCT 44.1  PLT 360  CREATININE 1.02  TROPONINIHS 776*    Estimated Creatinine Clearance: 71.2 mL/min (by C-G formula based on SCr of 1.02 mg/dL).   Medical History: Past Medical History:  Diagnosis Date  . Allergy    seasonal  . GERD (gastroesophageal reflux disease)   . Hyperlipidemia    no meds   . Hypertension     Assessment: 59yo male c/o CP that has worsened from baseline, troponin found to be elevated, to begin heparin.  Goal of Therapy:  Heparin level 0.3-0.7 units/ml Monitor platelets by anticoagulation protocol: Yes   Plan:  Will give heparin 3000 units IV bolus x1 followed by gtt at 900 units/hr and monitor heparin levels and CBC.  Wynona Neat, PharmD, BCPS  09/26/2019,3:36 AM

## 2019-09-26 NOTE — Consult Note (Addendum)
[address]       [place] [address]             [phone]        Evan Rivera [facility] Medical Record #[id number] Date of Birth: [birth year]  Referring: No ref. provider found Primary Care: Pleas Koch, NP Primary Cardiologist:Traci Radford Pax, MD  Chief Complaint:    Chief Complaint  Patient presents with  . Chest Pain  . Jaw Pain    History of Present Illness:      Evan Rivera is a 59 year old male with a past history significant for gastroesophageal reflux disease, dyslipidemia, hypertension, statin allergy, and past history of tobacco use having quit smoking about 30 years ago.  He has been experiencing some epigastric pain off and on after meals for the past 4 to 5 years.  Earlier this month,  he had similar symptoms but with associated shortness of breath and radiation of pain to his jaw. He contacted his primary care physician and was advised to report to the ER.  He was seen in the emergency room on 09/09/2019 and was discharged to home after work-up revealed high-sensitivity troponin of 23 followed by 25 and an EKG showing normal sinus rhythm with some inferior changes along with "age undetermined T wave abnormality".  He came back to the emergency room last night after a much more intense episode of substernal chest pain radiating to his left jaw that was again associated with shortness of breath.  Work-up in the emergency room included an EKG that showed nonspecific T wave abnormality with a normal sinus rhythm.  Initial troponin was greater than 700 with serial troponins increasing to 4000 followed by 10,000 by early this morning.  CBC and BMP were unremarkable.  His pain subsided shortly after arrival to the hospital but having ruled in for acute non-ST elevation myocardial infarction, he was admitted to the hospital by the cardiology service and started on nitroglycerin and heparin infusions.  He remained hemodynamically stable.  Further  work-up was continued this morning with left heart catheterization that demonstrated mild global hypokinesis with ejection fraction of 50 to 55%.  There is severe three-vessel coronary artery disease demonstrated with 70 to 80% tandem lesions in the proximal and mid LAD.  The second diagonal had a 90% proximal stenosis.  The ostial circumflex coronary artery had a 90% stenosis along with a 70% stenosis in the first OM.  The proximal right coronary artery had an 85% stenosis with total occlusion at the mid RCA. 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 Heparin is currently off following the cath and he is on oral nitrates.  We have been asked to evaluate Mr. Evan Rivera for consideration of operative coronary revascularization.   Current Activity/ Functional Status:     Zubrod Score: At the time of surgery this patient's most appropriate activity status/level should be described as: []     0    Normal activity, no symptoms [x]     1    Restricted in physical strenuous activity but ambulatory, able to do out light work []     2    Ambulatory and capable of self care, unable to do work activities, up and about                 more than 50%  Of the time                            []   3    Only limited self care, in bed greater than 50% of waking hours []     4    Completely disabled, no self care, confined to bed or chair []     5    Moribund  Past Medical History:  Diagnosis Date  . Allergy    seasonal  . GERD (gastroesophageal reflux disease)   . Hyperlipidemia    no meds   . Hypertension     Past Surgical History:  Procedure Laterality Date  . COLONOSCOPY     > 10 yeras ago- normal per pt.   Marland Kitchen HERNIA REPAIR     umbilical   . LEFT HEART CATH AND CORONARY ANGIOGRAPHY N/A 09/26/2019   Procedure: LEFT HEART CATH AND CORONARY ANGIOGRAPHY;  Surgeon: Martinique, Peter M, MD;  Location: [facility];  Service: Cardiovascular;  Laterality:  N/A;  . TONSILLECTOMY      Social History   Tobacco Use  Smoking Status Former Smoker  . Types: Cigarettes  . Quit date: 06/23/1989  . Years since quitting: 30.2  Smokeless Tobacco Former Systems developer    Social History     Alcohol Use No     Allergies  Allergen Reactions  . Statins Anaphylaxis    Pt does not remember which statin caused the reaction  . Drug [Tape] Hives    Reaction to some forms of bandaids - please use paper tape    Current Facility-Administered Medications  Medication Dose Route Frequency Provider Last Rate Last Admin  . 0.9 %  sodium chloride infusion  250 mL Intravenous PRN Martinique, Peter M, MD      . 0.9% sodium chloride infusion  1 mL/kg/hr Intravenous Continuous Martinique, Peter M, MD 74.8 mL/hr at 09/26/19 1609 1 mL/kg/hr at 09/26/19 1609  . acetaminophen (TYLENOL) tablet 650 mg  650 mg Oral Q4H PRN Martinique, Peter M, MD   650 mg at 09/26/19 1707  . carvedilol (COREG) tablet 12.5 mg  12.5 mg Oral BID WC Martinique, Peter M, MD   12.5 mg at 09/26/19 1707  . [START ON 09/27/2019] cefUROXime (ZINACEF) 1.5 g in sodium chloride 0.9 % 100 mL IVPB  1.5 g Intravenous To OR Grace Isaac, MD      . Derrill Memo ON 09/27/2019] cefUROXime (ZINACEF) 750 mg in sodium chloride 0.9 % 100 mL IVPB  750 mg Intravenous To OR Grace Isaac, MD      . Derrill Memo ON 09/27/2019] dexmedetomidine (PRECEDEX) 400 MCG/100ML (4 mcg/mL) infusion  0.1-0.7 mcg/kg/hr Intravenous To OR Grace Isaac, MD      . Derrill Memo ON 09/27/2019] EPINEPHrine (ADRENALIN) 4 mg in NS 250 mL (0.016 mg/mL) premix infusion  0-10 mcg/min Intravenous To OR Grace Isaac, MD      . Derrill Memo ON 09/27/2019] heparin 30,000 units/NS 1000 mL solution for CELLSAVER   Other To OR Grace Isaac, MD      . heparin ADULT infusion 100 units/mL (25000 units/290mL sodium chloride 0.45%)  900 Units/hr Intravenous Continuous Ronna Polio, RPH 9 mL/hr at 09/26/19 1614 900 Units/hr at 09/26/19 1614  . [START ON 09/27/2019] heparin  sodium (porcine) 2,500 Units, papaverine 30 mg in electrolyte-148 (PLASMALYTE-148) 500 mL irrigation   Irrigation To OR Grace Isaac, MD      . Derrill Memo ON 09/27/2019] insulin regular, human (MYXREDLIN) 100 units/ 100 mL infusion   Intravenous To OR Grace Isaac, MD      . irbesartan (AVAPRO) tablet 300 mg  300 mg  Oral QPM Sueanne Margarita, MD   300 mg at 09/26/19 1707  . isosorbide mononitrate (IMDUR) 24 hr tablet 30 mg  30 mg Oral Daily Martinique, Peter M, MD   30 mg at 09/26/19 1102  . [START ON 09/27/2019] magnesium sulfate (IV Push/IM) injection 40 mEq  40 mEq Other To OR Grace Isaac, MD      . Derrill Memo ON 09/27/2019] milrinone (PRIMACOR) 20 MG/100 ML (0.2 mg/mL) infusion  0.3 mcg/kg/min Intravenous To OR Grace Isaac, MD      . Derrill Memo ON 09/27/2019] nitroGLYCERIN 50 mg in dextrose 5 % 250 mL (0.2 mg/mL) infusion  2-200 mcg/min Intravenous To OR Grace Isaac, MD      . Derrill Memo ON 09/27/2019] norepinephrine (LEVOPHED) 4mg  in 237mL premix infusion  0-40 mcg/min Intravenous To OR Grace Isaac, MD      . ondansetron [facility]) injection 4 mg  4 mg Intravenous Q6H PRN Martinique, Peter M, MD      . Derrill Memo ON 09/27/2019] phenylephrine (NEOSYNEPHRINE) 20-0.9 MG/250ML-% infusion  30-200 mcg/min Intravenous To OR Grace Isaac, MD      . Derrill Memo ON 09/27/2019] potassium chloride injection 80 mEq  80 mEq Other To OR Grace Isaac, MD      . sodium chloride flush (NS) 0.9 % injection 3 mL  3 mL Intravenous Once Long, Wonda Olds, MD      . sodium chloride flush (NS) 0.9 % injection 3 mL  3 mL Intravenous Q12H Martinique, Peter M, MD      . sodium chloride flush (NS) 0.9 % injection 3 mL  3 mL Intravenous PRN Martinique, Peter M, MD      . Derrill Memo ON 09/27/2019] tranexamic acid (CYKLOKAPRON) 2,500 mg in sodium chloride 0.9 % 250 mL (10 mg/mL) infusion  1.5 mg/kg/hr Intravenous To OR Grace Isaac, MD      . Derrill Memo ON 09/27/2019] tranexamic acid (CYKLOKAPRON) bolus via infusion - over 30  minutes 1,122 mg  15 mg/kg Intravenous To OR Grace Isaac, MD      . Derrill Memo ON 09/27/2019] tranexamic acid (CYKLOKAPRON) pump prime solution 150 mg  2 mg/kg Intracatheter To OR Grace Isaac, MD      . Derrill Memo ON 09/27/2019] vancomycin (VANCOREADY) IVPB 1250 mg/250 mL  1,250 mg Intravenous To OR Grace Isaac, MD        Medications Prior to Admission  Medication Sig Dispense Refill Last Dose  . Ascorbic Acid (VITAMIN C) 1000 MG tablet Take 2,000 mg by mouth at bedtime.   Past Week at Unknown time  . Aspirin-Acetaminophen-Caffeine (GOODY HEADACHE PO) Take 2 packets by mouth daily as needed (severe headache).   Past Week at Unknown time  . cholecalciferol (VITAMIN D) 25 MCG (1000 UNIT) tablet Take 2,000 Units by mouth at bedtime.   Past Week at Unknown time  . Garlic 9357 MG CAPS Take 2,000 mg by mouth at bedtime.   Past Week at Unknown time  . ibuprofen (ADVIL) 200 MG tablet Take 800 mg by mouth every 6 (six) hours as needed for headache (pain).   Past Week at Unknown time  . Multiple Vitamins-Minerals (ZINC PO) Take 1 tablet by mouth at bedtime.   Past Week at Unknown time  . valsartan (DIOVAN) 160 MG tablet Take 1-2 tablets (160-320 mg total) by mouth daily. (Patient taking differently: Take 320 mg by mouth at bedtime. ) 60 tablet 5 09/25/2019 at Unknown time    Family History  Problem Relation Age of Onset  . Prostate cancer Father        late 1's  . Hyperlipidemia Father   . Hypertension Father   . Heart disease Mother   . Colon cancer Neg Hx   . Colon polyps Neg Hx   . Esophageal cancer Neg Hx   . Rectal cancer Neg Hx   . Stomach cancer Neg Hx      Review of Systems:   ROS    Cardiac Review of Systems: Y or  [    ]= no  Chest Pain [  x  ]  Resting SOB [   ] Exertional SOB  [x  ]  Orthopnea [  ]   Pedal Edema [   ]    Palpitations [  ] Syncope  [  ]   Presyncope [   ]  General Review of Systems: [Y] = yes [  ]=no Constitional: recent weight change [  ]; anorexia  [  ]; fatigue [  ]; nausea [  ]; night sweats [  ]; fever [  ]; or chills [  ]                                                               Dental: Last Dentist visit:   Eye : blurred vision [  ]; diplopia [   ]; vision changes [  ];  Amaurosis fugax[  ]; Resp: cough [  ];  wheezing[  ];  hemoptysis[  ]; shortness of breath[x  ]; paroxysmal nocturnal dyspnea[  ]; dyspnea on exertion[x  ]; or orthopnea[  ];  GI:  gallstones[  ], vomiting[  ];  dysphagia[  ]; melena[  ];  hematochezia [  ]; heartburn[  ];   Hx of  Colonoscopy[  ]; GU: kidney stones [  ]; hematuria[  ];   dysuria [  ];  nocturia[  ];  history of     obstruction [  ]; urinary frequency [  ]             Skin: rash, swelling[  ];, hair loss[  ];  peripheral edema[  ];  or itching[  ]; Musculosketetal: myalgias[  ];  joint swelling[  ];  joint erythema[  ];  joint pain[  ];  back pain[  ];  Heme/Lymph: bruising[  ];  bleeding[  ];  anemia[  ];  Neuro: TIA[  ];  headaches[  ];  stroke[  ];  vertigo[  ];  seizures[  ];   paresthesias[  ];  difficulty walking[  ];  Psych:depression[  ]; anxiety[  ];  Endocrine: diabetes[  ];  thyroid dysfunction[  ]; He does not recall his last dental visit but says he currently has no dental problems he is concerned about.      Physical Exam: BP 110/78   Pulse 75   Temp 98.4 F (36.9 C) (Oral)   Resp 18   Ht 5\' 4"  (1.626 m)   Wt 72.7 kg   SpO2 100%   BMI 27.51 kg/m    General appearance: alert, cooperative and no distress Head: Normocephalic, without obvious abnormality, atraumatic Neck: no adenopathy, no carotid bruit, no JVD and supple, symmetrical, trachea midline Lymph nodes: Cervical, supraclavicular nodes normal.  Resp: clear to auscultation bilaterally Cardio: regular rate and rhythm, S1, S2 normal, no murmur, click, rub or gallop GI: soft, non-tender; bowel sounds normal; no masses,  no organomegaly Extremities: All well perfused with palpable pulses throughout. Allen's test  demonstrates brisk return of perfusion of the palms with release of ulnar occlusion.  Neurologic: Grossly normal  Diagnostic Studies & Laboratory data:     Recent Radiology Findings:     DG Chest 2 View  Result Date: 09/26/2019 CLINICAL DATA:  Chest pain EXAM: CHEST - 2 VIEW COMPARISON:  09/09/2019 FINDINGS: The heart size and mediastinal contours are within normal limits. Both lungs are clear. The visualized skeletal structures are unremarkable. IMPRESSION: No acute abnormality noted. Previously seen density is felt to represent summation of bony shadows not well visualized on the current exam. Electronically Signed   By: Inez Catalina M.D.   On: 09/26/2019 01:10    CT CHEST WITHOUT CONTRAST  TECHNIQUE: Multidetector CT imaging of the chest was performed following the standard protocol without IV contrast.  COMPARISON:  09/26/2019.  09/09/2019.  FINDINGS: Cardiovascular: The heart size is normal. No substantial pericardial effusion. Coronary artery calcification is evident. Atherosclerotic calcification is noted in the wall of the thoracic aorta.  Mediastinum/Nodes: 3.4 x 2.3 x 2.4 cm upper anterior mediastinal mass is identified. This appears to be exophytic off the inferior thymic isthmus is directed caudally behind the sternum and anterior to the trachea. No other mediastinal lymphadenopathy. No evidence for gross hilar lymphadenopathy although assessment is limited by the lack of intravenous contrast on today's study. The esophagus has normal imaging features. There is no axillary lymphadenopathy.  Lungs/Pleura: 3 mm left lower lobe perifissural nodule has triangular configuration consistent with lymph node. No anterior lung nodule or mass as suggested on chest x-ray of 09/09/2019. No focal airspace consolidation. No pleural effusion.  Upper Abdomen: Left upper pole renal stone has been incompletely visualized.  Musculoskeletal: No worrisome lytic or sclerotic  osseous abnormality.  IMPRESSION: 1. 3.4 cm upper anterior mediastinal lesion, likely exophytic off the inferior thymic isthmus. This would probably not be well seen on ultrasound due to retrosternal location. CT chest with contrast material or MRI without and with contrast may provide better characterization of the relationship to the thymic isthmus. Although lymphadenopathy or primary anterior mediastinal mass lesion are considered less likely, these possibilities are not excluded and close follow-up is recommended. This finding does not represent the opacity seen on the lateral chest x-ray of 09/09/2019. 2. No anterior lung mass as suggested on lateral chest x-ray of 09/09/2019. 3. Left upper pole renal stone, incompletely visualized. 4. Aortic Atherosclerosis (ICD10-I70.0).   Electronically Signed   By: Misty Stanley M.D.   On: 09/26/2019 13:55   CARDIAC CATHETERIZATION  LEFT HEART CATH AND CORONARY ANGIOGRAPHY  Conclusion    Prox LAD lesion is 70% stenosed.  Mid LAD lesion is 80% stenosed.  2nd Diag lesion is 90% stenosed.  Ost Cx to Prox Cx lesion is 90% stenosed.  1st Mrg lesion is 70% stenosed.  Prox RCA lesion is 85% stenosed.  Prox RCA to Mid RCA lesion is 100% stenosed.  There is mild left ventricular systolic dysfunction.  LV end diastolic pressure is mildly elevated.  The left ventricular ejection fraction is 50-55% by visual estimate.   1. Severe 3 vessel obstructive CAD 2. Mild LV impairment. EF 50% with global HK. 3. Mildly elevated LVEDP  Plan: CT surgery consultation for CABG.  Recommendations  Diagnostic Dominance: Right Left  Main  Vessel was injected. Vessel is normal in caliber. Vessel is angiographically normal.  Left Anterior Descending  Prox LAD lesion is 70% stenosed. The lesion is moderately calcified.  Mid LAD lesion is 80% stenosed. The lesion is moderately calcified.  First Diagonal Branch  Vessel is small in size.   Second Diagonal Branch  2nd Diag lesion is 90% stenosed.  Left Circumflex  Ost Cx to Prox Cx lesion is 90% stenosed.  First Obtuse Marginal Branch  Vessel is large in size.  1st Mrg lesion is 70% stenosed.  Right Coronary Artery  Prox RCA lesion is 85% stenosed.  Prox RCA to Mid RCA lesion is 100% stenosed. The lesion is chronically occluded with left-to-right and right-to-right collateral flow.  Acute Marginal Branch  Collaterals  Acute Mrg filled by collaterals from Prox RCA.    Right Posterior Descending Artery  Collaterals  RPDA filled by collaterals from Dist LAD.    Intervention  No interventions have been documented. Wall Motion  Resting               Left Heart  Left Ventricle The left ventricular size is normal. There is mild left ventricular systolic dysfunction. LV end diastolic pressure is mildly elevated. The left ventricular ejection fraction is 50-55% by visual estimate. There are LV function abnormalities due to global hypokinesis.  Coronary Diagrams  Diagnostic Dominance: Right  Intervention    PREOPERATIVE VASCULAR EVALUATION (prelimary) Indications:   Pre-CABG.  Risk Factors:   Hypertension, hyperlipidemia.  Comparison Study: no prior   Performing Technologist: Abram Sander RVS     Examination Guidelines: A complete evaluation includes B-mode imaging,  spectral  Doppler, color Doppler, and power Doppler as needed of all accessible  portions  of each vessel. Bilateral testing is considered an integral part of a  complete  examination. Limited examinations for reoccurring indications may be  performed  as noted.     Right Carotid Findings:  +----------+--------+--------+--------+------------+--------+       PSV cm/sEDV cm/sStenosisDescribe  Comments  +----------+--------+--------+--------+------------+--------+  CCA Prox 93   16       heterogenous        +----------+--------+--------+--------+------------+--------+  CCA Distal83   19       heterogenous      +----------+--------+--------+--------+------------+--------+  ICA Prox 73   22   1-39%  heterogenous      +----------+--------+--------+--------+------------+--------+  ICA Distal63   32                   +----------+--------+--------+--------+------------+--------+  ECA    72   7                    +----------+--------+--------+--------+------------+--------+   Portions of this table do not appear on this page.   +----------+--------+-------+--------+------------+       PSV cm/sEDV cmsDescribeArm Pressure  +----------+--------+-------+--------+------------+  [id number]                   +----------+--------+-------+--------+------------+   +---------+--------+--+--------+--+---------+  VertebralPSV cm/s58EDV cm/s16Antegrade  +---------+--------+--+--------+--+---------+   Left Carotid Findings:  +----------+--------+--------+--------+------------+--------+       PSV cm/sEDV cm/sStenosisDescribe  Comments  +----------+--------+--------+--------+------------+--------+  CCA Prox 84   20       heterogenous      +----------+--------+--------+--------+------------+--------+  CCA Distal93   24       heterogenous      +----------+--------+--------+--------+------------+--------+  ICA Prox 47   20   1-39%  heterogenous      +----------+--------+--------+--------+------------+--------+  ICA Distal52  24                   +----------+--------+--------+--------+------------+--------+  ECA    52                       +----------+--------+--------+--------+------------+--------+     +----------+--------+--------+--------+------------+  SubclavianPSV cm/sEDV cm/sDescribeArm Pressure  +----------+--------+--------+--------+------------+       92                   +----------+--------+--------+--------+------------+   +---------+--------+--+--------+--+---------+  VertebralPSV cm/s41EDV cm/s15Antegrade  +---------+--------+--+--------+--+---------+     ABI Findings:  +--------+------------------+-----+---------+--------+  Right  Rt Pressure (mmHg)IndexWaveform Comment   +--------+------------------+-----+---------+--------+  [id number]           triphasic      +--------+------------------+-----+---------+--------+  ATA               triphasic      +--------+------------------+-----+---------+--------+  PTA               triphasic      +--------+------------------+-----+---------+--------+   +--------+------------------+-----+---------+-------+  Left  Lt Pressure (mmHg)IndexWaveform Comment  +--------+------------------+-----+---------+-------+  Brachial            triphasic      +--------+------------------+-----+---------+-------+  ATA               triphasic      +--------+------------------+-----+---------+-------+  PTA               triphasic      +--------+------------------+-----+---------+-------+      Right Doppler Findings:  +--------+--------+-----+---------+--------+  Site  PressureIndexDoppler Comments  +--------+--------+-----+---------+--------+  [id number]      triphasic      +--------+--------+-----+---------+--------+  Radial        triphasic      +--------+--------+-----+---------+--------+  Ulnar         triphasic      +--------+--------+-----+---------+--------+      Left Doppler  Findings:  +--------+--------+-----+---------+--------+  Site  PressureIndexDoppler Comments  +--------+--------+-----+---------+--------+  Brachial       triphasic      +--------+--------+-----+---------+--------+  Radial        triphasic      +--------+--------+-----+---------+--------+  Ulnar         triphasic      +--------+--------+-----+---------+--------+      Summary:  Right Carotid: Velocities in the right ICA are consistent with a 1-39%  stenosis.   Left Carotid: Velocities in the left ICA are consistent with a 1-39%  stenosis.  Vertebrals: Bilateral vertebral arteries demonstrate antegrade flow.   Right Upper Extremity: Doppler waveforms remain within normal limits with  right radial compression. Doppler waveform obliterate with right ulnar  compression.  Left Upper Extremity: Doppler waveforms remain within normal limits with  left radial compression. Doppler waveform obliterate with left ulnar  compression.       I have independently reviewed the above radiologic studies and discussed with the patient   Recent Lab Findings: Lab Results  Component Value Date   WBC 8.2 09/26/2019   HGB 14.3 09/26/2019   HCT 41.9 09/26/2019   PLT 330 09/26/2019   GLUCOSE 101 (H) 09/26/2019   CHOL 191 09/26/2019   TRIG 164 (H) 09/26/2019   HDL 40 (L) 09/26/2019   LDLDIRECT 97.0 09/13/2019   LDLCALC 118 (H) 09/26/2019   ALT 28 09/26/2019   AST 60 (H) 09/26/2019   NA 139 09/26/2019   K 3.8 09/26/2019   CL 106 09/26/2019   CREATININE 0.85 09/26/2019   BUN 15 09/26/2019  CO2 24 09/26/2019   TSH 1.054 09/26/2019   INR 1.0 09/26/2019   HGBA1C 5.6 09/26/2019      Assessment / Plan:    -Severe multi-vessel coronary artery disease in a 59yo male presenting withacute NSTEMI. Coronary bypass grafting is his best option for myocardial preservation and survival benefit. Surgery was offered to him and he has elected  to proceed.  Pre-operative workup including Echo and vascular screening studies have been requested. Tentatively plan for surgery tomorrow.   -History of Hypertension- reasonably controlled, currently on NTG infusion.   -History of statin allergy- He could not recall the specific agent that caused this reaction but review of the EMR reveals an admission in January of 2012 for syncope preceded by flushing and generalized erythema that occurred after the third dose of Niaspan. I explained that this is not related to statins and he is willing to consider trying a statin postoperatively.   -History of gastroesophageal reflux disease, would add PPI.   -Question of pulmonary mass on CXR--this has been investigated today with a non-contast CT. See report above. The images show no evidence of mass in the region of question on the CXR but do show:   "3.4 cm upper anterior mediastinal lesion, likely exophytic off  the inferior thymic isthmus. This would probably not be well seen on  ultrasound due to retrosternal location. CT chest with contrast  material or MRI without and with contrast may provide better  characterization of the relationship to the thymic isthmus."    I have seen the patient reviewed his history reviewed cardiac cath films echocardiogram Doppler studies chest x-ray ordered and reviewed CT scan of the chest.  With the patient's significant three-vessel coronary artery disease, symptomatic I agree with Dr. Martinique that coronary artery bypass grafting offers the best relief of symptoms and maintaining good functional status.  The chest x-ray abnormality is not demonstrated on CT scan but the incidental finding of anterior mediastinal mass is evident we we will evaluate this as we proceed with sternotomy and resected S it seems indicated  The goals risks and alternatives of the planned surgical procedure Procedure(s): LEFT HEART CATH AND CORONARY ANGIOGRAPHY (N/A)  have been discussed with  the patient in detail. The risks of the procedure including death, infection, stroke, myocardial infarction, bleeding, blood transfusion, neuro vascular injury to hand with use of radial artery have all been discussed specifically.  I have quoted Evan Rivera a2 % of perioperative mortality and a complication rate as high as 30 %. The patient's questions have been answered.Evan Rivera is willing  to proceed with the planned procedure.  Grace Isaac, PA-C [phone]  09/26/2019 5:50 PM

## 2019-09-26 NOTE — Progress Notes (Signed)
Echocardiogram 2D Echocardiogram has been performed.  Oneal Deputy Mailee Klaas 09/26/2019, 2:13 PM

## 2019-09-26 NOTE — Progress Notes (Signed)
Pre cabg has been completed.   Preliminary results in CV Proc.   Abram Sander 09/26/2019 1:44 PM

## 2019-09-26 NOTE — H&P (Signed)
Cardiology Admission History and Physical:   Patient ID: Evan Rivera MRN: [id number]; DOB: [birth year]   Admission date: 09/26/2019  Primary Care Provider: Pleas Koch, NP Primary Cardiologist:None (NEW) Primary Electrophysiologist:  None   Chief Complaint:  Chest pain  Patient Profile:   Evan Rivera is a 59 y.o. male with a hx of HTN, HLD and remote tobacco who presented to ER after a prolonged episode of chest pain and positive hsTroponin at 776.  History of Present Illness:   Evan Rivera is a 59 y.o. male with a hx of HTN, HLD and remote tobacco who presented to ER after a prolonged episode of chest pain and positive hsTroponin at 776.  He tells me that he has had problems with throat pain off and on for 5 years after he eats.  Over the past 2 years the symptoms have progressed in frequency and recently he has started having episodes of chest pain that are midsternal with radiation into his throat and jaw.  He says that usually his sx would come on after he would eat a meal and then exert himself.  Recently the pain occurs just after exertion.  He says that he can go out in the yard and work or play with his grandkids without any problems but then will go to take the trash out and start having CP regardless of when he has eaten.  Yesterday he developed CP around 9pm  that was 10/10 radiating into his jaw and associated with SOB.  The pain lasted for over 6 hours until he reached the ER and currently is pain free.    In ER labs show hsTrop 776, Creatinine 1.02, Hbg 14.8.  EKG showed NSR with nonspecific ST abnormality.  Tele showed a run of AIVR.     Past Medical History:  Diagnosis Date  . Allergy    seasonal  . GERD (gastroesophageal reflux disease)   . Hyperlipidemia    no meds   . Hypertension     Past Surgical History:  Procedure Laterality Date  . COLONOSCOPY     > 10 yeras ago- normal per pt.   Marland Kitchen HERNIA REPAIR     umbilical   . TONSILLECTOMY        Medications Prior to Admission: Prior to Admission medications   Medication Sig Start Date End Date Taking? Authorizing Provider  Ascorbic Acid (VITAMIN C) 1000 MG tablet Take 2,000 mg by mouth at bedtime.   Yes [provider]  Aspirin-Acetaminophen-Caffeine (GOODY HEADACHE PO) Take 2 packets by mouth daily as needed (severe headache).   Yes [provider]  cholecalciferol (VITAMIN D) 25 MCG (1000 UNIT) tablet Take 2,000 Units by mouth at bedtime.   Yes [provider]  Garlic 0240 MG CAPS Take 2,000 mg by mouth at bedtime.   Yes [provider]  ibuprofen (ADVIL) 200 MG tablet Take 800 mg by mouth every 6 (six) hours as needed for headache (pain).   Yes [provider]  Multiple Vitamins-Minerals (ZINC PO) Take 1 tablet by mouth at bedtime.   Yes [provider]  valsartan (DIOVAN) 160 MG tablet Take 1-2 tablets (160-320 mg total) by mouth daily. Patient taking differently: Take 320 mg by mouth at bedtime.  03/14/19  Yes Tonia Ghent, MD     Allergies:    Allergies  Allergen Reactions  . Statins Anaphylaxis    Pt does not remember which statin caused the reaction  . Drug [Tape] Hives  Reaction to some forms of bandaids - please use paper tape    Social History:   Social History   Socioeconomic History  . Marital status: Married    Spouse name: Not on file  . Number of children: Not on file  . Years of education: Not on file  . Highest education level: Not on file  Occupational History  . Not on file  Tobacco Use  . Smoking status: Former Smoker    Types: Cigarettes    Quit date: 06/23/1989    Years since quitting: 30.2  . Smokeless tobacco: Former Network engineer and Sexual Activity  . Alcohol use: No  . Drug use: No  . Sexual activity: Not on file  Other Topics Concern  . Not on file  Social History Narrative   Married.   2 children.   Works as a Administrator.   Enjoys kayaking.    [employer] years.      Social Determinants of Health   Financial Resource Strain:   . Difficulty of Paying Living Expenses:   Food Insecurity:   . Worried About Charity fundraiser in the Last Year:   . Arboriculturist in the Last Year:   Transportation Needs:   . Film/video editor (Medical):   Marland Kitchen Lack of Transportation (Non-Medical):   Physical Activity:   . Days of Exercise per Week:   . Minutes of Exercise per Session:   Stress:   . Feeling of Stress :   Social Connections:   . Frequency of Communication with Friends and Family:   . Frequency of Social Gatherings with Friends and Family:   . Attends Religious Services:   . Active Member of Clubs or Organizations:   . Attends Archivist Meetings:   Marland Kitchen Marital Status:   Intimate Partner Violence:   . Fear of Current or Ex-Partner:   . Emotionally Abused:   Marland Kitchen Physically Abused:   . Sexually Abused:     Family History:   The patient's family history includes Heart disease in his mother; Hyperlipidemia in his father; Hypertension in his father; Prostate cancer in his father. There is no history of Colon cancer, Colon polyps, Esophageal cancer, Rectal cancer, or Stomach cancer.    ROS:  Please see the history of present illness.  All other ROS reviewed and negative.     Physical Exam/Data:   Vitals:   09/26/19 0030 09/26/19 0244  BP: (!) 134/97 (!) 133/83  Pulse: 66 65  Resp: 18 16  Temp: 98.6 F (37 C)   TempSrc: Oral   SpO2: 100% 100%  Weight: 74.8 kg   Height: 5\' 4"  (1.626 m)    No intake or output data in the 24 hours ending 09/26/19 0439 Last 3 Weights 09/26/2019 09/13/2019 09/09/2019  Weight (lbs) 165 lb 165 lb 12 oz 160 lb  Weight (kg) 74.844 kg 75.184 kg 72.576 kg     Body mass index is 28.32 kg/m.  General:  Well nourished, well developed, in no acute distress HEENT: normal Lymph: no adenopathy Neck: no JVD Endocrine:  No thryomegaly Vascular: No carotid bruits; FA pulses 2+ bilaterally without bruits   Cardiac:  normal S1, S2; RRR; no murmur  Lungs:  clear to auscultation bilaterally, no wheezing, rhonchi or rales  Abd: soft, nontender, no hepatomegaly  Ext: no edema Musculoskeletal:  No deformities, BUE and BLE strength normal and equal Skin: warm and dry  Neuro:  CNs 2-12 intact, no  focal abnormalities noted Psych:  Normal affect    EKG:  The ECG that was done in ER was personally reviewed and demonstrates NSR with nonspecific ST abnormality  Relevant CV Studies:   Laboratory Data:  High Sensitivity Troponin:   Recent Labs  Lab 09/09/19 1725 09/09/19 2010 09/26/19 0047 09/26/19 0327  TROPONINIHS 23* 25* 776* 4,098*      Chemistry Recent Labs  Lab 09/26/19 0047  NA 139  K 4.0  CL 104  CO2 25  GLUCOSE 117*  BUN 20  CREATININE 1.02  CALCIUM 9.6  GFRNONAA >60  GFRAA >60  ANIONGAP 10    No results for input(s): PROT, ALBUMIN, AST, ALT, ALKPHOS, BILITOT in the last 168 hours. Hematology Recent Labs  Lab 09/26/19 0047  WBC 11.0*  RBC 4.97  HGB 14.8  HCT 44.1  MCV 88.7  MCH 29.8  MCHC 33.6  RDW 12.4  PLT 360   BNPNo results for input(s): BNP, PROBNP in the last 168 hours.  DDimer No results for input(s): DDIMER in the last 168 hours.   Radiology/Studies:  DG Chest 2 View  Result Date: 09/26/2019 CLINICAL DATA:  Chest pain EXAM: CHEST - 2 VIEW COMPARISON:  09/09/2019 FINDINGS: The heart size and mediastinal contours are within normal limits. Both lungs are clear. The visualized skeletal structures are unremarkable. IMPRESSION: No acute abnormality noted. Previously seen density is felt to represent summation of bony shadows not well visualized on the current exam. Electronically Signed   By: Inez Catalina M.D.   On: 09/26/2019 01:10       TIMI Risk Score for Unstable Angina or Non-ST Elevation MI:   The patient's TIMI risk score is 3, which indicates a 13% risk of all cause mortality, new or recurrent myocardial infarction or need for urgent  revascularization in the next 14 days.   Assessment and Plan:   1. NSTEMI -somewhat atypical in that it has been occurring for 5 years and initially felt to be related to GERD but treatment did not help and symptoms have become progressive and now with exertion such as taking trash out -had prolonged episode of CP yesterday lasting >6 hours with radiation to his jaw and SOB -symptoms concerning for crescendo anginal and now NSTEMI -EKG with nonspecific changes -currently pain free -CRFs include HTN, HLD, fm hx of CAD and remote tobacco -will make NPO -plan LHC in am to define coronary anatomy -continue IV Heparin gtt -start IV NTG if recurrent CP -continue ASA 81mg  daily -no statin at present due to hx of anaphylaxis in the past and unsure of which statin it was -start Lopressor 12.5mg  BID -Cardiac catheterization was discussed with the patient fully. The patient understands that risks include but are not limited to stroke (1 in 1000), death (1 in 55), kidney failure [usually temporary] (1 in 500), bleeding (1 in 200), allergic reaction [possibly serious] (1 in 200).  The patient understands and is willing to proceed.   -check 2D echo to assess LVF   2.  HTN -BP controlled at 133/13mmHg -continue Valsartan 320mg  daily  3.  HLD -he is statin intolerant but dose not remember which statin he had tried but had anaphylaxis -check FLP -check HbA1C -if found to have CAD and elevated LDL, recommend lipid clinic for PCSK9i  4.  Accelerated Idioventricular rhythm -noted on tele and caught on 12 lead EKG -asymptomatic -could be reperfusion arrhythmia -starting BB  Severity of Illness: The appropriate patient status for this patient is OBSERVATION.  Observation status is judged to be reasonable and necessary in order to provide the required intensity of service to ensure the patient's safety. The patient's presenting symptoms, physical exam findings, and initial radiographic and  laboratory data in the context of their medical condition is felt to place them at decreased risk for further clinical deterioration. Furthermore, it is anticipated that the patient will be medically stable for discharge from the hospital within 2 midnights of admission. The following factors support the patient status of observation.   " The patient's presenting symptoms include chest pain. " The physical exam findings include non. " The initial radiographic and laboratory data are elevated hsTroponin.     For questions or updates, please contact [place] Please consult [email or web address] for contact info under        Signed, Fransico Him, MD  09/26/2019 4:39 AM

## 2019-09-26 NOTE — Anesthesia Preprocedure Evaluation (Addendum)
Anesthesia Evaluation  Patient identified by MRN, date of birth, ID band Patient awake    Reviewed: Allergy & Precautions, NPO status , Patient's Chart, lab work & pertinent test results  Airway Mallampati: II  TM Distance: >3 FB Neck ROM: Full    Dental  (+) Teeth Intact, Dental Advisory Given   Pulmonary former smoker,    Pulmonary exam normal breath sounds clear to auscultation       Cardiovascular hypertension, Pt. on medications + CAD and + Past MI  Normal cardiovascular exam Rhythm:Regular Rate:Normal  Echo 09/25/19: IMPRESSIONS    1. Left ventricular ejection fraction, by estimation, is 55 to 60%. The  left ventricle has normal function. The left ventricle has no regional  wall motion abnormalities. Left ventricular diastolic parameters were  normal.  2. Right ventricular systolic function is normal. The right ventricular  size is normal. Tricuspid regurgitation signal is inadequate for assessing  PA pressure.  3. The mitral valve is grossly normal. Trivial mitral valve  regurgitation. No evidence of mitral stenosis.  4. The aortic valve is tricuspid. Aortic valve regurgitation is trivial.  No aortic stenosis is present.  5. The inferior vena cava is dilated in size with <50% respiratory  variability, suggesting right atrial pressure of 15 mmHg.    Neuro/Psych negative neurological ROS  negative psych ROS   GI/Hepatic Neg liver ROS, GERD  ,  Endo/Other  negative endocrine ROS  Renal/GU negative Renal ROS     Musculoskeletal negative musculoskeletal ROS (+)   Abdominal   Peds  Hematology negative hematology ROS (+)   Anesthesia Other Findings   Reproductive/Obstetrics                            Anesthesia Physical Anesthesia Plan  ASA: IV  Anesthesia Plan: General   Post-op Pain Management:    Induction: Intravenous  PONV Risk Score and Plan: 2 and Treatment may  vary due to age or medical condition and Midazolam  Airway Management Planned: Oral ETT  Additional Equipment: Arterial line, CVP, TEE and Ultrasound Guidance Line Placement  Intra-op Plan:   Post-operative Plan: Post-operative intubation/ventilation  Informed Consent: I have reviewed the patients History and Physical, chart, labs and discussed the procedure including the risks, benefits and alternatives for the proposed anesthesia with the patient or authorized representative who has indicated his/her understanding and acceptance.     Dental advisory given  Plan Discussed with: CRNA  Anesthesia Plan Comments: (FlowTrac)       Anesthesia Quick Evaluation

## 2019-09-26 NOTE — ED Triage Notes (Signed)
Pt reports substernal chest pain that radiates to his jaw X4 years, however it has become more severe and lasting longer over the past month.

## 2019-09-27 ENCOUNTER — Encounter (HOSPITAL_COMMUNITY)
Admission: EM | Disposition: A | Discharge status: Home / Self Care | Payer: Self-pay | Source: Home / Self Care | Attending: Cardiothoracic Surgery

## 2019-09-27 ENCOUNTER — Inpatient Hospital Stay (HOSPITAL_COMMUNITY)

## 2019-09-27 ENCOUNTER — Inpatient Hospital Stay (HOSPITAL_COMMUNITY): Admitting: Certified Registered"

## 2019-09-27 DIAGNOSIS — I25110 Atherosclerotic heart disease of native coronary artery with unstable angina pectoris: Secondary | ICD-10-CM | POA: Diagnosis not present

## 2019-09-27 DIAGNOSIS — Z951 Presence of aortocoronary bypass graft: Secondary | ICD-10-CM

## 2019-09-27 HISTORY — PX: RESECTION OF MEDIASTINAL MASS: SHX6497

## 2019-09-27 HISTORY — PX: ENDOVEIN HARVEST OF GREATER SAPHENOUS VEIN: SHX5059

## 2019-09-27 HISTORY — PX: TEE WITHOUT CARDIOVERSION: SHX5443

## 2019-09-27 HISTORY — PX: CORONARY ARTERY BYPASS GRAFT: SHX141

## 2019-09-27 HISTORY — PX: RADIAL ARTERY HARVEST: SHX5067

## 2019-09-27 LAB — CBC
HCT: 38 % — ABNORMAL LOW (ref 39.0–52.0)
HCT: 32.9 % — ABNORMAL LOW (ref 39.0–52.0)
HCT: 33.1 % — ABNORMAL LOW (ref 39.0–52.0)
Hemoglobin: 12.6 g/dL — ABNORMAL LOW (ref 13.0–17.0)
Hemoglobin: 11 g/dL — ABNORMAL LOW (ref 13.0–17.0)
Hemoglobin: 11 g/dL — ABNORMAL LOW (ref 13.0–17.0)
MCH: 30.4 pg (ref 26.0–34.0)
MCH: 30.1 pg (ref 26.0–34.0)
MCH: 30 pg (ref 26.0–34.0)
MCHC: 33.2 g/dL (ref 30.0–36.0)
MCHC: 33.4 g/dL (ref 30.0–36.0)
MCHC: 33.2 g/dL (ref 30.0–36.0)
MCV: 91.8 fL (ref 80.0–100.0)
MCV: 89.9 fL (ref 80.0–100.0)
MCV: 90.2 fL (ref 80.0–100.0)
Platelets: 278 10*3/uL (ref 150–400)
Platelets: 183 10*3/uL (ref 150–400)
Platelets: 186 10*3/uL (ref 150–400)
RBC: 4.14 MIL/uL — ABNORMAL LOW (ref 4.22–5.81)
RBC: 3.66 MIL/uL — ABNORMAL LOW (ref 4.22–5.81)
RBC: 3.67 MIL/uL — ABNORMAL LOW (ref 4.22–5.81)
RDW: 12.9 % (ref 11.5–15.5)
RDW: 12.8 % (ref 11.5–15.5)
RDW: 12.7 % (ref 11.5–15.5)
WBC: 8.6 10*3/uL (ref 4.0–10.5)
WBC: 12.7 10*3/uL — ABNORMAL HIGH (ref 4.0–10.5)
WBC: 11 10*3/uL — ABNORMAL HIGH (ref 4.0–10.5)
nRBC: 0 % (ref 0.0–0.2)
nRBC: 0 % (ref 0.0–0.2)
nRBC: 0 % (ref 0.0–0.2)

## 2019-09-27 LAB — POCT I-STAT 7, (LYTES, BLD GAS, ICA,H+H)
Acid-base deficit: 2 mmol/L (ref 0.0–2.0)
Acid-base deficit: 1 mmol/L (ref 0.0–2.0)
Acid-base deficit: 2 mmol/L (ref 0.0–2.0)
Acid-base deficit: 4 mmol/L — ABNORMAL HIGH (ref 0.0–2.0)
Bicarbonate: 24 mmol/L (ref 20.0–28.0)
Bicarbonate: 23.2 mmol/L (ref 20.0–28.0)
Bicarbonate: 23.5 mmol/L (ref 20.0–28.0)
Bicarbonate: 20.7 mmol/L (ref 20.0–28.0)
Calcium, Ion: 1.26 mmol/L (ref 1.15–1.40)
Calcium, Ion: 0.97 mmol/L — ABNORMAL LOW (ref 1.15–1.40)
Calcium, Ion: 1.19 mmol/L (ref 1.15–1.40)
Calcium, Ion: 1.19 mmol/L (ref 1.15–1.40)
HCT: 33 % — ABNORMAL LOW (ref 39.0–52.0)
HCT: 25 % — ABNORMAL LOW (ref 39.0–52.0)
HCT: 31 % — ABNORMAL LOW (ref 39.0–52.0)
HCT: 29 % — ABNORMAL LOW (ref 39.0–52.0)
Hemoglobin: 11.2 g/dL — ABNORMAL LOW (ref 13.0–17.0)
Hemoglobin: 8.5 g/dL — ABNORMAL LOW (ref 13.0–17.0)
Hemoglobin: 10.5 g/dL — ABNORMAL LOW (ref 13.0–17.0)
Hemoglobin: 9.9 g/dL — ABNORMAL LOW (ref 13.0–17.0)
O2 Saturation: 100 %
O2 Saturation: 100 %
O2 Saturation: 97 %
O2 Saturation: 97 %
Patient temperature: 36.5
Patient temperature: 37
Potassium: 4.1 mmol/L (ref 3.5–5.1)
Potassium: 4.5 mmol/L (ref 3.5–5.1)
Potassium: 3.9 mmol/L (ref 3.5–5.1)
Potassium: 4.2 mmol/L (ref 3.5–5.1)
Sodium: 141 mmol/L (ref 135–145)
Sodium: 141 mmol/L (ref 135–145)
Sodium: 143 mmol/L (ref 135–145)
Sodium: 143 mmol/L (ref 135–145)
TCO2: 25 mmol/L (ref 22–32)
TCO2: 24 mmol/L (ref 22–32)
TCO2: 25 mmol/L (ref 22–32)
TCO2: 22 mmol/L (ref 22–32)
pCO2 arterial: 45.2 mmHg (ref 32.0–48.0)
pCO2 arterial: 35.6 mmHg (ref 32.0–48.0)
pCO2 arterial: 43 mmHg (ref 32.0–48.0)
pCO2 arterial: 37.2 mmHg (ref 32.0–48.0)
pH, Arterial: 7.334 — ABNORMAL LOW (ref 7.350–7.450)
pH, Arterial: 7.422 (ref 7.350–7.450)
pH, Arterial: 7.344 — ABNORMAL LOW (ref 7.350–7.450)
pH, Arterial: 7.352 (ref 7.350–7.450)
pO2, Arterial: 464 mmHg — ABNORMAL HIGH (ref 83.0–108.0)
pO2, Arterial: 332 mmHg — ABNORMAL HIGH (ref 83.0–108.0)
pO2, Arterial: 89 mmHg (ref 83.0–108.0)
pO2, Arterial: 91 mmHg (ref 83.0–108.0)

## 2019-09-27 LAB — ECHO INTRAOPERATIVE TEE
Height: 64 in
Weight: 2556.8 oz

## 2019-09-27 LAB — GLUCOSE, CAPILLARY
Glucose-Capillary: 103 mg/dL — ABNORMAL HIGH (ref 70–99)
Glucose-Capillary: 98 mg/dL (ref 70–99)
Glucose-Capillary: 101 mg/dL — ABNORMAL HIGH (ref 70–99)
Glucose-Capillary: 117 mg/dL — ABNORMAL HIGH (ref 70–99)
Glucose-Capillary: 125 mg/dL — ABNORMAL HIGH (ref 70–99)
Glucose-Capillary: 116 mg/dL — ABNORMAL HIGH (ref 70–99)

## 2019-09-27 LAB — BASIC METABOLIC PANEL
Anion gap: 6 (ref 5–15)
Anion gap: 9 (ref 5–15)
BUN: 13 mg/dL (ref 6–20)
BUN: 11 mg/dL (ref 6–20)
CO2: 24 mmol/L (ref 22–32)
CO2: 21 mmol/L — ABNORMAL LOW (ref 22–32)
Calcium: 8.7 mg/dL — ABNORMAL LOW (ref 8.9–10.3)
Calcium: 8.2 mg/dL — ABNORMAL LOW (ref 8.9–10.3)
Chloride: 109 mmol/L (ref 98–111)
Chloride: 108 mmol/L (ref 98–111)
Creatinine, Ser: 0.9 mg/dL (ref 0.61–1.24)
Creatinine, Ser: 0.68 mg/dL (ref 0.61–1.24)
GFR calc Af Amer: >60 mL/min (ref >60)
GFR calc Af Amer: >60 mL/min (ref >60)
GFR calc non Af Amer: >60 mL/min (ref >60)
GFR calc non Af Amer: >60 mL/min (ref >60)
Glucose, Bld: 104 mg/dL — ABNORMAL HIGH (ref 70–99)
Glucose, Bld: 114 mg/dL — ABNORMAL HIGH (ref 70–99)
Potassium: 4.4 mmol/L (ref 3.5–5.1)
Potassium: 4.1 mmol/L (ref 3.5–5.1)
Sodium: 139 mmol/L (ref 135–145)
Sodium: 138 mmol/L (ref 135–145)

## 2019-09-27 LAB — PREPARE RBC (CROSSMATCH)

## 2019-09-27 LAB — POCT I-STAT, CHEM 8
BUN: 13 mg/dL (ref 6–20)
BUN: 12 mg/dL (ref 6–20)
BUN: 10 mg/dL (ref 6–20)
BUN: 11 mg/dL (ref 6–20)
BUN: 11 mg/dL (ref 6–20)
BUN: 11 mg/dL (ref 6–20)
Calcium, Ion: 1.29 mmol/L (ref 1.15–1.40)
Calcium, Ion: 1.27 mmol/L (ref 1.15–1.40)
Calcium, Ion: 0.99 mmol/L — ABNORMAL LOW (ref 1.15–1.40)
Calcium, Ion: 1.18 mmol/L (ref 1.15–1.40)
Calcium, Ion: 1.19 mmol/L (ref 1.15–1.40)
Calcium, Ion: 1.22 mmol/L (ref 1.15–1.40)
Chloride: 106 mmol/L (ref 98–111)
Chloride: 106 mmol/L (ref 98–111)
Chloride: 103 mmol/L (ref 98–111)
Chloride: 105 mmol/L (ref 98–111)
Chloride: 105 mmol/L (ref 98–111)
Chloride: 105 mmol/L (ref 98–111)
Creatinine, Ser: 0.6 mg/dL — ABNORMAL LOW (ref 0.61–1.24)
Creatinine, Ser: 0.6 mg/dL — ABNORMAL LOW (ref 0.61–1.24)
Creatinine, Ser: 0.5 mg/dL — ABNORMAL LOW (ref 0.61–1.24)
Creatinine, Ser: 0.7 mg/dL (ref 0.61–1.24)
Creatinine, Ser: 0.5 mg/dL — ABNORMAL LOW (ref 0.61–1.24)
Creatinine, Ser: 0.6 mg/dL — ABNORMAL LOW (ref 0.61–1.24)
Glucose, Bld: 97 mg/dL (ref 70–99)
Glucose, Bld: 107 mg/dL — ABNORMAL HIGH (ref 70–99)
Glucose, Bld: 92 mg/dL (ref 70–99)
Glucose, Bld: 149 mg/dL — ABNORMAL HIGH (ref 70–99)
Glucose, Bld: 120 mg/dL — ABNORMAL HIGH (ref 70–99)
Glucose, Bld: 132 mg/dL — ABNORMAL HIGH (ref 70–99)
HCT: 31 % — ABNORMAL LOW (ref 39.0–52.0)
HCT: 33 % — ABNORMAL LOW (ref 39.0–52.0)
HCT: 24 % — ABNORMAL LOW (ref 39.0–52.0)
HCT: 25 % — ABNORMAL LOW (ref 39.0–52.0)
HCT: 25 % — ABNORMAL LOW (ref 39.0–52.0)
HCT: 26 % — ABNORMAL LOW (ref 39.0–52.0)
Hemoglobin: 10.5 g/dL — ABNORMAL LOW (ref 13.0–17.0)
Hemoglobin: 11.2 g/dL — ABNORMAL LOW (ref 13.0–17.0)
Hemoglobin: 8.2 g/dL — ABNORMAL LOW (ref 13.0–17.0)
Hemoglobin: 8.5 g/dL — ABNORMAL LOW (ref 13.0–17.0)
Hemoglobin: 8.5 g/dL — ABNORMAL LOW (ref 13.0–17.0)
Hemoglobin: 8.8 g/dL — ABNORMAL LOW (ref 13.0–17.0)
Potassium: 4 mmol/L (ref 3.5–5.1)
Potassium: 4.4 mmol/L (ref 3.5–5.1)
Potassium: 4.5 mmol/L (ref 3.5–5.1)
Potassium: 4.3 mmol/L (ref 3.5–5.1)
Potassium: 4 mmol/L (ref 3.5–5.1)
Potassium: 4.7 mmol/L (ref 3.5–5.1)
Sodium: 141 mmol/L (ref 135–145)
Sodium: 140 mmol/L (ref 135–145)
Sodium: 140 mmol/L (ref 135–145)
Sodium: 139 mmol/L (ref 135–145)
Sodium: 139 mmol/L (ref 135–145)
Sodium: 141 mmol/L (ref 135–145)
TCO2: 24 mmol/L (ref 22–32)
TCO2: 24 mmol/L (ref 22–32)
TCO2: 23 mmol/L (ref 22–32)
TCO2: 24 mmol/L (ref 22–32)
TCO2: 22 mmol/L (ref 22–32)
TCO2: 25 mmol/L (ref 22–32)

## 2019-09-27 LAB — HEMOGLOBIN AND HEMATOCRIT, BLOOD
HCT: 27.4 % — ABNORMAL LOW (ref 39.0–52.0)
Hemoglobin: 9.2 g/dL — ABNORMAL LOW (ref 13.0–17.0)

## 2019-09-27 LAB — SURGICAL PCR SCREEN
MRSA, PCR: NEGATIVE
Staphylococcus aureus: NEGATIVE

## 2019-09-27 LAB — APTT: aPTT: 30 seconds (ref 24–36)

## 2019-09-27 LAB — URINALYSIS, ROUTINE W REFLEX MICROSCOPIC
Bilirubin Urine: NEGATIVE
Glucose, UA: NEGATIVE mg/dL
Hgb urine dipstick: NEGATIVE
Ketones, ur: NEGATIVE mg/dL
Leukocytes,Ua: NEGATIVE
Nitrite: NEGATIVE
Protein, ur: NEGATIVE mg/dL
Specific Gravity, Urine: 1.021 (ref 1.005–1.030)
pH: 6 (ref 5.0–8.0)

## 2019-09-27 LAB — PROTIME-INR
INR: 1.3 — ABNORMAL HIGH (ref 0.8–1.2)
Prothrombin Time: 15.7 seconds — ABNORMAL HIGH (ref 11.4–15.2)

## 2019-09-27 LAB — PLATELET COUNT: Platelets: 179 10*3/uL (ref 150–400)

## 2019-09-27 LAB — MAGNESIUM: Magnesium: 2.4 mg/dL (ref 1.7–2.4)

## 2019-09-27 SURGERY — CORONARY ARTERY BYPASS GRAFTING (CABG)
Anesthesia: General | Site: Chest | Laterality: Right

## 2019-09-27 MED ORDER — SUCCINYLCHOLINE CHLORIDE 200 MG/10ML IV SOSY
PREFILLED_SYRINGE | INTRAVENOUS | Status: AC
  Filled 2019-09-27: qty 10

## 2019-09-27 MED ORDER — SODIUM CHLORIDE 0.9% FLUSH
3.0000 mL | INTRAVENOUS | Status: DC | PRN
Start: 2019-09-27 — End: 2019-09-27

## 2019-09-27 MED ORDER — VANCOMYCIN HCL IN DEXTROSE 1-5 GM/200ML-% IV SOLN
1000.0000 mg | Freq: Once | INTRAVENOUS | Status: AC
  Administered 2019-09-27: 1000 mg via INTRAVENOUS
  Filled 2019-09-27: qty 200

## 2019-09-27 MED ORDER — SODIUM CHLORIDE 0.9% FLUSH
3.0000 mL | Freq: Two times a day (BID) | INTRAVENOUS | Status: DC
  Administered 2019-09-27 – 2019-09-29 (×2): 3 mL via INTRAVENOUS

## 2019-09-27 MED ORDER — INSULIN ASPART 100 UNIT/ML ~~LOC~~ SOLN: 0.0000 [IU] | SUBCUTANEOUS | Status: DC

## 2019-09-27 MED ORDER — ISOSORBIDE MONONITRATE ER 30 MG PO TB24
30.0000 mg | ORAL_TABLET | Freq: Every day | ORAL | Status: DC
  Administered 2019-09-28 – 2019-10-01 (×4): 30 mg via ORAL
  Filled 2019-09-27 (×4): qty 1

## 2019-09-27 MED ORDER — INSULIN REGULAR(HUMAN) IN NACL 100-0.9 UT/100ML-% IV SOLN
INTRAVENOUS | Status: DC
  Administered 2019-09-27: 1 [IU]/h via INTRAVENOUS

## 2019-09-27 MED ORDER — METOPROLOL TARTRATE 5 MG/5ML IV SOLN: 2.5000 mg | INTRAVENOUS | Status: DC | PRN

## 2019-09-27 MED ORDER — PHENYLEPHRINE HCL-NACL 20-0.9 MG/250ML-% IV SOLN: 0.0000 ug/min | INTRAVENOUS | Status: DC

## 2019-09-27 MED ORDER — LACTATED RINGERS IV SOLN: 500.0000 mL | Freq: Once | INTRAVENOUS | Status: DC | PRN

## 2019-09-27 MED ORDER — ACETAMINOPHEN 500 MG PO TABS
1000.0000 mg | ORAL_TABLET | Freq: Four times a day (QID) | ORAL | Status: DC
  Administered 2019-09-27 – 2019-10-01 (×13): 1000 mg via ORAL
  Filled 2019-09-27 (×13): qty 2

## 2019-09-27 MED ORDER — LIDOCAINE 2% (20 MG/ML) 5 ML SYRINGE
INTRAMUSCULAR | Status: AC
  Filled 2019-09-27: qty 5

## 2019-09-27 MED ORDER — POTASSIUM CHLORIDE 10 MEQ/50ML IV SOLN
10.0000 meq | INTRAVENOUS | Status: AC
  Administered 2019-09-27 (×3): 10 meq via INTRAVENOUS

## 2019-09-27 MED ORDER — DEXTROSE 50 % IV SOLN: 0.0000 mL | INTRAVENOUS | Status: DC | PRN

## 2019-09-27 MED ORDER — SODIUM CHLORIDE 0.9 % IV SOLN: 250.0000 mL | INTRAVENOUS | Status: DC

## 2019-09-27 MED ORDER — ROCURONIUM BROMIDE 10 MG/ML (PF) SYRINGE
PREFILLED_SYRINGE | INTRAVENOUS | Status: AC
  Filled 2019-09-27: qty 10

## 2019-09-27 MED ORDER — BISACODYL 10 MG RE SUPP: 10.0000 mg | Freq: Every day | RECTAL | Status: DC

## 2019-09-27 MED ORDER — BISACODYL 5 MG PO TBEC
10.0000 mg | DELAYED_RELEASE_TABLET | Freq: Every day | ORAL | Status: DC
  Administered 2019-09-28 – 2019-10-01 (×3): 10 mg via ORAL
  Filled 2019-09-27 (×3): qty 2

## 2019-09-27 MED ORDER — ACETAMINOPHEN 160 MG/5ML PO SOLN: 1000.0000 mg | Freq: Four times a day (QID) | ORAL | Status: DC

## 2019-09-27 MED ORDER — MIDAZOLAM HCL 5 MG/5ML IJ SOLN
INTRAMUSCULAR | Status: DC | PRN
  Administered 2019-09-27 (×6): 2 mg via INTRAVENOUS

## 2019-09-27 MED ORDER — SODIUM CHLORIDE 0.9 % IV SOLN
1.5000 g | Freq: Two times a day (BID) | INTRAVENOUS | Status: AC
  Administered 2019-09-27 – 2019-09-29 (×4): 1.5 g via INTRAVENOUS
  Filled 2019-09-27 (×4): qty 1.5

## 2019-09-27 MED ORDER — PROTAMINE SULFATE 10 MG/ML IV SOLN
INTRAVENOUS | Status: AC
  Filled 2019-09-27: qty 25

## 2019-09-27 MED ORDER — CHLORHEXIDINE GLUCONATE 0.12 % MT SOLN
15.0000 mL | OROMUCOSAL | Status: AC
  Administered 2019-09-27: 15 mL via OROMUCOSAL

## 2019-09-27 MED ORDER — PROPOFOL 10 MG/ML IV BOLUS
INTRAVENOUS | Status: DC | PRN
  Administered 2019-09-27: 20 mg via INTRAVENOUS
  Administered 2019-09-27: 120 mg via INTRAVENOUS

## 2019-09-27 MED ORDER — LACTATED RINGERS IV SOLN: INTRAVENOUS | Status: DC

## 2019-09-27 MED ORDER — LACTATED RINGERS IV SOLN: INTRAVENOUS | Status: DC | PRN

## 2019-09-27 MED ORDER — MAGNESIUM SULFATE 4 GM/100ML IV SOLN
4.0000 g | Freq: Once | INTRAVENOUS | Status: AC
  Administered 2019-09-27: 4 g via INTRAVENOUS

## 2019-09-27 MED ORDER — PANTOPRAZOLE SODIUM 40 MG PO TBEC
40.0000 mg | DELAYED_RELEASE_TABLET | Freq: Every day | ORAL | Status: DC
  Administered 2019-09-29 – 2019-10-01 (×3): 40 mg via ORAL
  Filled 2019-09-27 (×3): qty 1

## 2019-09-27 MED ORDER — PROTAMINE SULFATE 10 MG/ML IV SOLN
INTRAVENOUS | Status: DC | PRN
  Administered 2019-09-27 (×2): 50 mg via INTRAVENOUS
  Administered 2019-09-27: 30 mg via INTRAVENOUS
  Administered 2019-09-27 (×2): 50 mg via INTRAVENOUS
  Administered 2019-09-27: 30 mg via INTRAVENOUS

## 2019-09-27 MED ORDER — ASPIRIN 81 MG PO CHEW: 324.0000 mg | CHEWABLE_TABLET | Freq: Every day | ORAL | Status: DC

## 2019-09-27 MED ORDER — TRAMADOL HCL 50 MG PO TABS: 50.0000 mg | ORAL_TABLET | ORAL | Status: DC | PRN

## 2019-09-27 MED ORDER — SODIUM CHLORIDE 0.9 % IV SOLN: INTRAVENOUS | Status: DC

## 2019-09-27 MED ORDER — DOCUSATE SODIUM 100 MG PO CAPS
200.0000 mg | ORAL_CAPSULE | Freq: Every day | ORAL | Status: DC
  Administered 2019-09-28 – 2019-10-01 (×4): 200 mg via ORAL
  Filled 2019-09-27 (×4): qty 2

## 2019-09-27 MED ORDER — ROCURONIUM BROMIDE 10 MG/ML (PF) SYRINGE
PREFILLED_SYRINGE | INTRAVENOUS | Status: DC | PRN
  Administered 2019-09-27 (×3): 50 mg via INTRAVENOUS
  Administered 2019-09-27: 100 mg via INTRAVENOUS

## 2019-09-27 MED ORDER — ASPIRIN EC 325 MG PO TBEC
325.0000 mg | DELAYED_RELEASE_TABLET | Freq: Every day | ORAL | Status: DC
  Administered 2019-09-28 – 2019-09-30 (×3): 325 mg via ORAL
  Filled 2019-09-27 (×3): qty 1

## 2019-09-27 MED ORDER — CHLORHEXIDINE GLUCONATE CLOTH 2 % EX PADS
6.0000 | MEDICATED_PAD | Freq: Every day | CUTANEOUS | Status: DC
  Administered 2019-09-27: 6 via TOPICAL

## 2019-09-27 MED ORDER — LIDOCAINE 2% (20 MG/ML) 5 ML SYRINGE
INTRAMUSCULAR | Status: DC | PRN
  Administered 2019-09-27: 100 mg via INTRAVENOUS

## 2019-09-27 MED ORDER — METOPROLOL TARTRATE 12.5 MG HALF TABLET
12.5000 mg | ORAL_TABLET | Freq: Two times a day (BID) | ORAL | Status: DC
  Administered 2019-09-28 – 2019-09-29 (×3): 12.5 mg via ORAL
  Filled 2019-09-27 (×3): qty 1

## 2019-09-27 MED ORDER — CHLORHEXIDINE GLUCONATE CLOTH 2 % EX PADS: 6.0000 | MEDICATED_PAD | Freq: Every day | CUTANEOUS | Status: DC

## 2019-09-27 MED ORDER — NITROGLYCERIN IN D5W 200-5 MCG/ML-% IV SOLN: 7.0000 ug/min | INTRAVENOUS | Status: DC

## 2019-09-27 MED ORDER — DEXMEDETOMIDINE HCL IN NACL 400 MCG/100ML IV SOLN
0.0000 ug/kg/h | INTRAVENOUS | Status: DC
  Administered 2019-09-28: 0.2 ug/kg/h via INTRAVENOUS
  Filled 2019-09-27: qty 100

## 2019-09-27 MED ORDER — ACETAMINOPHEN 160 MG/5ML PO SOLN: 650.0000 mg | Freq: Once | ORAL | Status: AC

## 2019-09-27 MED ORDER — VANCOMYCIN HCL 1000 MG IV SOLR
INTRAVENOUS | Status: DC | PRN
  Administered 2019-09-27: 1250 mg via INTRAVENOUS

## 2019-09-27 MED ORDER — ONDANSETRON HCL 4 MG/2ML IJ SOLN
INTRAMUSCULAR | Status: AC
  Filled 2019-09-27: qty 2

## 2019-09-27 MED ORDER — PHENYLEPHRINE HCL-NACL 10-0.9 MG/250ML-% IV SOLN
INTRAVENOUS | Status: DC | PRN
Start: 2019-09-27 — End: 2019-09-27
  Administered 2019-09-27: 25 ug/min via INTRAVENOUS

## 2019-09-27 MED ORDER — SODIUM CHLORIDE 0.45 % IV SOLN: INTRAVENOUS | Status: DC | PRN

## 2019-09-27 MED ORDER — METOPROLOL TARTRATE 25 MG/10 ML ORAL SUSPENSION
12.5000 mg | Freq: Two times a day (BID) | ORAL | Status: DC
  Administered 2019-09-27: 12.5 mg
  Filled 2019-09-27 (×3): qty 5

## 2019-09-27 MED ORDER — PLASMA-LYTE 148 IV SOLN
INTRAVENOUS | Status: DC | PRN
  Administered 2019-09-27: 500 mL via INTRAVASCULAR

## 2019-09-27 MED ORDER — MORPHINE SULFATE (PF) 2 MG/ML IV SOLN
1.0000 mg | INTRAVENOUS | Status: DC | PRN
  Administered 2019-09-27 – 2019-09-28 (×3): 4 mg via INTRAVENOUS
  Administered 2019-09-28 (×2): 2 mg via INTRAVENOUS
  Filled 2019-09-27: qty 1
  Filled 2019-09-27 (×3): qty 2
  Filled 2019-09-27: qty 1

## 2019-09-27 MED ORDER — FENTANYL CITRATE (PF) 100 MCG/2ML IJ SOLN
INTRAMUSCULAR | Status: DC | PRN
  Administered 2019-09-27: 200 ug via INTRAVENOUS
  Administered 2019-09-27: 100 ug via INTRAVENOUS
  Administered 2019-09-27: 150 ug via INTRAVENOUS
  Administered 2019-09-27: 50 ug via INTRAVENOUS
  Administered 2019-09-27: 100 ug via INTRAVENOUS
  Administered 2019-09-27: 250 ug via INTRAVENOUS
  Administered 2019-09-27: 150 ug via INTRAVENOUS
  Administered 2019-09-27: 250 ug via INTRAVENOUS

## 2019-09-27 MED ORDER — SODIUM CHLORIDE 0.9% FLUSH: 3.0000 mL | INTRAVENOUS | Status: DC | PRN

## 2019-09-27 MED ORDER — MIDAZOLAM HCL 2 MG/2ML IJ SOLN
INTRAMUSCULAR | Status: AC
  Filled 2019-09-27: qty 2

## 2019-09-27 MED ORDER — SODIUM CHLORIDE 0.9% FLUSH: 10.0000 mL | INTRAVENOUS | Status: DC | PRN

## 2019-09-27 MED ORDER — MIDAZOLAM HCL (PF) 10 MG/2ML IJ SOLN
INTRAMUSCULAR | Status: AC
  Filled 2019-09-27: qty 2

## 2019-09-27 MED ORDER — FENTANYL CITRATE (PF) 250 MCG/5ML IJ SOLN
INTRAMUSCULAR | Status: AC
  Filled 2019-09-27: qty 25

## 2019-09-27 MED ORDER — HEMOSTATIC AGENTS (NO CHARGE) OPTIME
TOPICAL | Status: DC | PRN
  Administered 2019-09-27 (×3): 1 via TOPICAL

## 2019-09-27 MED ORDER — PROPOFOL 10 MG/ML IV BOLUS
INTRAVENOUS | Status: AC
  Filled 2019-09-27: qty 20

## 2019-09-27 MED ORDER — SODIUM CHLORIDE (PF) 0.9 % IJ SOLN
OROMUCOSAL | Status: DC | PRN
  Administered 2019-09-27 (×3): 4 mL via TOPICAL

## 2019-09-27 MED ORDER — MIDAZOLAM HCL 2 MG/2ML IJ SOLN
2.0000 mg | INTRAMUSCULAR | Status: DC | PRN
  Administered 2019-09-27: 2 mg via INTRAVENOUS
  Filled 2019-09-27: qty 2

## 2019-09-27 MED ORDER — HEPARIN SODIUM (PORCINE) 1000 UNIT/ML IJ SOLN
INTRAMUSCULAR | Status: AC
  Filled 2019-09-27: qty 1

## 2019-09-27 MED ORDER — SODIUM CHLORIDE 0.9% FLUSH
10.0000 mL | Freq: Two times a day (BID) | INTRAVENOUS | Status: DC
  Administered 2019-09-29 (×2): 10 mL

## 2019-09-27 MED ORDER — SODIUM CHLORIDE 0.9% FLUSH: 3.0000 mL | Freq: Two times a day (BID) | INTRAVENOUS | Status: DC

## 2019-09-27 MED ORDER — FAMOTIDINE IN NACL 20-0.9 MG/50ML-% IV SOLN
20.0000 mg | Freq: Two times a day (BID) | INTRAVENOUS | Status: AC
  Administered 2019-09-27: 20 mg via INTRAVENOUS

## 2019-09-27 MED ORDER — PHENYLEPHRINE 40 MCG/ML (10ML) SYRINGE FOR IV PUSH (FOR BLOOD PRESSURE SUPPORT)
PREFILLED_SYRINGE | INTRAVENOUS | Status: AC
  Filled 2019-09-27: qty 10

## 2019-09-27 MED ORDER — ACETAMINOPHEN 650 MG RE SUPP
650.0000 mg | Freq: Once | RECTAL | Status: AC
  Administered 2019-09-27: 650 mg via RECTAL

## 2019-09-27 MED ORDER — ONDANSETRON HCL 4 MG/2ML IJ SOLN
4.0000 mg | Freq: Four times a day (QID) | INTRAMUSCULAR | Status: DC | PRN
  Administered 2019-09-29: 4 mg via INTRAVENOUS
  Filled 2019-09-27: qty 2

## 2019-09-27 MED ORDER — OXYCODONE HCL 5 MG PO TABS
5.0000 mg | ORAL_TABLET | ORAL | Status: DC | PRN
  Administered 2019-09-27 – 2019-09-28 (×3): 10 mg via ORAL
  Administered 2019-09-28: 5 mg via ORAL
  Administered 2019-09-28 – 2019-10-01 (×11): 10 mg via ORAL
  Filled 2019-09-27 (×14): qty 2
  Filled 2019-09-27: qty 1
  Filled 2019-09-27: qty 2

## 2019-09-27 MED ORDER — ALBUMIN HUMAN 5 % IV SOLN
250.0000 mL | INTRAVENOUS | Status: AC | PRN
  Administered 2019-09-27 (×2): 12.5 g via INTRAVENOUS

## 2019-09-27 MED ORDER — HEPARIN SODIUM (PORCINE) 1000 UNIT/ML IJ SOLN
INTRAMUSCULAR | Status: DC | PRN
Start: 2019-09-27 — End: 2019-09-27
  Administered 2019-09-27: 26000 [IU] via INTRAVENOUS

## 2019-09-27 MED ORDER — ALBUMIN HUMAN 5 % IV SOLN: INTRAVENOUS | Status: DC | PRN

## 2019-09-27 MED ORDER — SODIUM CHLORIDE 0.9 % IV SOLN
250.0000 mL | INTRAVENOUS | Status: DC | PRN
Start: 2019-09-27 — End: 2019-09-27

## 2019-09-27 MED ORDER — 0.9 % SODIUM CHLORIDE (POUR BTL) OPTIME
TOPICAL | Status: DC | PRN
  Administered 2019-09-27: 1000 mL
  Administered 2019-09-27: 5000 mL

## 2019-09-27 MED FILL — Nitroglycerin IV Soln 100 MCG/ML in D5W: INTRA_ARTERIAL | Qty: 10 | Status: AC

## 2019-09-27 SURGICAL SUPPLY — 108 items
ADH SKN CLS APL DERMABOND .7 (GAUZE/BANDAGES/DRESSINGS) ×4
AGENT HMST KT MTR STRL THRMB (HEMOSTASIS) ×4
APPLIER CLIP 9.375 SM OPEN (CLIP) ×6
APR CLP SM 9.3 20 MLT OPN (CLIP) ×4
BAG DECANTER FOR FLEXI CONT (MISCELLANEOUS) ×6 IMPLANT
BLADE CLIPPER SURG (BLADE) ×6 IMPLANT
BLADE STERNUM SYSTEM 6 (BLADE) ×6 IMPLANT
BLADE SURG 15 STRL LF DISP TIS (BLADE) IMPLANT
BLADE SURG 15 STRL SS (BLADE) ×6
BNDG ELASTIC 4X5.8 VLCR STR LF (GAUZE/BANDAGES/DRESSINGS) ×8 IMPLANT
BNDG ELASTIC 6X5.8 VLCR STR LF (GAUZE/BANDAGES/DRESSINGS) ×6 IMPLANT
BNDG GAUZE ELAST 4 BULKY (GAUZE/BANDAGES/DRESSINGS) ×8 IMPLANT
CANISTER SUCT 3000ML PPV (MISCELLANEOUS) ×6 IMPLANT
CATH CPB KIT GERHARDT (MISCELLANEOUS) ×6 IMPLANT
CATH THORACIC 28FR (CATHETERS) ×6 IMPLANT
CLIP APPLIE 9.375 SM OPEN (CLIP) IMPLANT
CLIP VESOCCLUDE MED 24/CT (CLIP) IMPLANT
CLIP VESOCCLUDE SM WIDE 24/CT (CLIP) IMPLANT
CNTNR URN SCR LID CUP LEK RST (MISCELLANEOUS) IMPLANT
CONT SPEC 4OZ STRL OR WHT (MISCELLANEOUS) ×6
COVER MAYO STAND STRL (DRAPES) ×2 IMPLANT
CUFF TOURN SGL QUICK 18X4 (TOURNIQUET CUFF) IMPLANT
CUFF TOURN SGL QUICK 24 (TOURNIQUET CUFF)
CUFF TRNQT CYL 24X4X16.5-23 (TOURNIQUET CUFF) IMPLANT
DERMABOND ADVANCED (GAUZE/BANDAGES/DRESSINGS) ×2
DERMABOND ADVANCED .7 DNX12 (GAUZE/BANDAGES/DRESSINGS) IMPLANT
DRAIN CHANNEL 28F RND 3/8 FF (WOUND CARE) ×6 IMPLANT
DRAPE CARDIOVASCULAR INCISE (DRAPES) ×6
DRAPE EXTREMITY T 121X128X90 (DISPOSABLE) ×6 IMPLANT
DRAPE HALF SHEET 40X57 (DRAPES) ×2 IMPLANT
DRAPE SLUSH/WARMER DISC (DRAPES) ×6 IMPLANT
DRAPE SRG 135X102X78XABS (DRAPES) ×4 IMPLANT
DRSG AQUACEL AG ADV 3.5X14 (GAUZE/BANDAGES/DRESSINGS) ×6 IMPLANT
ELECT BLADE 4.0 EZ CLEAN MEGAD (MISCELLANEOUS) ×6
ELECT REM PT RETURN 9FT ADLT (ELECTROSURGICAL) ×12
ELECTRODE BLDE 4.0 EZ CLN MEGD (MISCELLANEOUS) ×4 IMPLANT
ELECTRODE REM PT RTRN 9FT ADLT (ELECTROSURGICAL) ×8 IMPLANT
FELT TEFLON 1X6 (MISCELLANEOUS) ×10 IMPLANT
FILTER SMOKE EVAC ULPA (FILTER) ×6 IMPLANT
GAUZE SPONGE 4X4 12PLY STRL (GAUZE/BANDAGES/DRESSINGS) ×12 IMPLANT
GAUZE SPONGE 4X4 12PLY STRL LF (GAUZE/BANDAGES/DRESSINGS) ×2 IMPLANT
GEL ULTRASOUND 20GR AQUASONIC (MISCELLANEOUS) ×2 IMPLANT
GLOVE BIO SURGEON STRL SZ 6.5 (GLOVE) ×17 IMPLANT
GLOVE BIO SURGEONS STRL SZ 6.5 (GLOVE) ×5
GLOVE BIOGEL PI IND STRL 6 (GLOVE) IMPLANT
GLOVE BIOGEL PI IND STRL 6.5 (GLOVE) IMPLANT
GLOVE BIOGEL PI IND STRL 9 (GLOVE) IMPLANT
GLOVE BIOGEL PI INDICATOR 6 (GLOVE) ×6
GLOVE BIOGEL PI INDICATOR 6.5 (GLOVE) ×10
GLOVE BIOGEL PI INDICATOR 9 (GLOVE) ×2
GLOVE SURG SS PI 7.5 STRL IVOR (GLOVE) ×4 IMPLANT
GOWN STRL REUS W/ TWL LRG LVL3 (GOWN DISPOSABLE) ×16 IMPLANT
GOWN STRL REUS W/TWL LRG LVL3 (GOWN DISPOSABLE) ×36
HEMOSTAT POWDER SURGIFOAM 1G (HEMOSTASIS) ×18 IMPLANT
HEMOSTAT SURGICEL 2X14 (HEMOSTASIS) ×8 IMPLANT
IV CATH 18G X1.75 CATHLON (IV SOLUTION) ×2 IMPLANT
KIT BASIN OR (CUSTOM PROCEDURE TRAY) ×6 IMPLANT
KIT CATH SUCT 8FR (CATHETERS) ×6 IMPLANT
KIT SUCTION CATH 14FR (SUCTIONS) ×12 IMPLANT
KIT TURNOVER KIT B (KITS) ×6 IMPLANT
KIT VASOVIEW HEMOPRO 2 VH 4000 (KITS) ×6 IMPLANT
LEAD PACING MYOCARDI (MISCELLANEOUS) ×6 IMPLANT
MARKER GRAFT CORONARY BYPASS (MISCELLANEOUS) ×18 IMPLANT
NS IRRIG 1000ML POUR BTL (IV SOLUTION) ×32 IMPLANT
PACK E OPEN HEART (SUTURE) ×6 IMPLANT
PACK OPEN HEART (CUSTOM PROCEDURE TRAY) ×6 IMPLANT
PAD ARMBOARD 7.5X6 YLW CONV (MISCELLANEOUS) ×12 IMPLANT
PAD ELECT DEFIB RADIOL ZOLL (MISCELLANEOUS) ×6 IMPLANT
PENCIL BUTTON HOLSTER BLD 10FT (ELECTRODE) ×6 IMPLANT
PENCIL SMOKE EVACUATOR (MISCELLANEOUS) ×6 IMPLANT
POSITIONER HEAD DONUT 9IN (MISCELLANEOUS) ×6 IMPLANT
PUNCH AORTIC ROTATE 4.5MM 8IN (MISCELLANEOUS) ×2 IMPLANT
SET CARDIOPLEGIA MPS 5001102 (MISCELLANEOUS) ×2 IMPLANT
SHEARS HARMONIC 9CM CVD (BLADE) ×6 IMPLANT
SLEEVE SUCTION 125 (MISCELLANEOUS) ×6 IMPLANT
SOL ANTI FOG 6CC (MISCELLANEOUS) IMPLANT
SOLUTION ANTI FOG 6CC (MISCELLANEOUS) ×2
SPONGE LAP 18X18 RF (DISPOSABLE) ×8 IMPLANT
SPONGE LAP 4X18 RFD (DISPOSABLE) ×2 IMPLANT
SURGIFLO W/THROMBIN 8M KIT (HEMOSTASIS) ×2 IMPLANT
SUT BONE WAX W31G (SUTURE) ×6 IMPLANT
SUT MNCRL AB 4-0 PS2 18 (SUTURE) ×4 IMPLANT
SUT PROLENE 3 0 SH DA (SUTURE) ×2 IMPLANT
SUT PROLENE 3 0 SH1 36 (SUTURE) ×6 IMPLANT
SUT PROLENE 4 0 RB 1 (SUTURE) ×6
SUT PROLENE 4 0 TF (SUTURE) ×12 IMPLANT
SUT PROLENE 4-0 RB1 .5 CRCL 36 (SUTURE) IMPLANT
SUT PROLENE 6 0 CC (SUTURE) ×14 IMPLANT
SUT PROLENE 7 0 BV1 MDA (SUTURE) ×8 IMPLANT
SUT PROLENE 8 0 BV175 6 (SUTURE) ×6 IMPLANT
SUT STEEL 6MS V (SUTURE) ×6 IMPLANT
SUT STEEL SZ 6 DBL 3X14 BALL (SUTURE) ×6 IMPLANT
SUT VIC AB 1 CTX 18 (SUTURE) ×12 IMPLANT
SUT VIC AB 2-0 CT1 27 (SUTURE) ×12
SUT VIC AB 2-0 CT1 TAPERPNT 27 (SUTURE) IMPLANT
SUT VIC AB 3-0 SH 27 (SUTURE)
SUT VIC AB 3-0 SH 27X BRD (SUTURE) IMPLANT
SUT VIC AB 3-0 X1 27 (SUTURE) IMPLANT
SYR 50ML SLIP (SYRINGE) IMPLANT
SYSTEM SAHARA CHEST DRAIN ATS (WOUND CARE) ×6 IMPLANT
TAPE PAPER 3X10 WHT MICROPORE (GAUZE/BANDAGES/DRESSINGS) ×2 IMPLANT
TOWEL GREEN STERILE (TOWEL DISPOSABLE) ×6 IMPLANT
TOWEL GREEN STERILE FF (TOWEL DISPOSABLE) ×6 IMPLANT
TRAP FLUID SMOKE EVACUATOR (MISCELLANEOUS) ×6 IMPLANT
TRAY FOLEY SLVR 16FR TEMP STAT (SET/KITS/TRAYS/PACK) ×6 IMPLANT
TUBING LAP HI FLOW INSUFFLATIO (TUBING) ×6 IMPLANT
UNDERPAD 30X36 HEAVY ABSORB (UNDERPADS AND DIAPERS) ×6 IMPLANT
WATER STERILE IRR 1000ML POUR (IV SOLUTION) ×12 IMPLANT

## 2019-09-27 NOTE — Anesthesia Procedure Notes (Signed)
Arterial Line Insertion Start/End7/27/2021 6:50 AM, 09/27/2019 7:00 AM Performed by: Lavell Luster, CRNA  Preanesthetic checklist: patient identified, IV checked, site marked, risks and benefits discussed, surgical consent, monitors and equipment checked and timeout performed Lidocaine 1% used for infiltration and patient sedated Right, radial was placed Catheter size: 20 G Hand hygiene performed , maximum sterile barriers used  and Seldinger technique used Allen's test indicative of satisfactory collateral circulation Attempts: 1 Procedure performed without using ultrasound guided technique. Following insertion, Biopatch and dressing applied. Post procedure assessment: normal  Patient tolerated the procedure well with no immediate complications.

## 2019-09-27 NOTE — Progress Notes (Signed)
      [address]       [place] [address]             [phone]    Pre Procedure note for inpatients:   Evan Rivera has been scheduled for Procedure(s): CORONARY ARTERY BYPASS GRAFTING (CABG) (N/A) Possible, RADIAL ARTERY HARVEST (Left) TRANSESOPHAGEAL ECHOCARDIOGRAM (TEE) (N/A) today. The various methods of treatment have been discussed with the patient. After consideration of the risks, benefits and treatment options the patient has consented to the planned procedure.   The patient has been seen and labs reviewed. There are no changes in the patient's condition to prevent proceeding with the planned procedure today.  Recent labs:  Lab Results  Component Value Date   WBC 8.6 09/27/2019   HGB 12.6 (L) 09/27/2019   HCT 38.0 (L) 09/27/2019   PLT 278 09/27/2019   GLUCOSE 104 (H) 09/27/2019   CHOL 191 09/26/2019   TRIG 164 (H) 09/26/2019   HDL 40 (L) 09/26/2019   LDLDIRECT 97.0 09/13/2019   LDLCALC 118 (H) 09/26/2019   ALT 28 09/26/2019   AST 60 (H) 09/26/2019   NA 139 09/27/2019   K 4.4 09/27/2019   CL 109 09/27/2019   CREATININE 0.90 09/27/2019   BUN 13 09/27/2019   CO2 24 09/27/2019   TSH 1.054 09/26/2019   PSA 0.30 09/13/2019   INR 1.1 09/26/2019   HGBA1C 5.6 09/26/2019    Grace Isaac, MD 09/27/2019 7:21 AM

## 2019-09-27 NOTE — Brief Op Note (Addendum)
      [address]       [place] [address]             [phone]       09/27/2019  12:02 PM  PATIENT:  Evan Rivera  59 y.o. male  PRE-OPERATIVE DIAGNOSIS:  CAD  POST-OPERATIVE DIAGNOSIS:  CAD  PROCEDURE:  Procedure(s): CORONARY ARTERY BYPASS GRAFTING (CABG), ON PUMP, TIMES 4, USING LEFT INTERNAL MAMMARY ARTERY, LEFT RADIAL ARTERY, AND ENDOSCOPICALLY HARVESTED RIGHT GREATER SAPHENOUS VEIN (N/A) RADIAL ARTERY HARVEST (Left) TRANSESOPHAGEAL ECHOCARDIOGRAM (TEE) (N/A) RESECTION OF INCIDENTAL ANTERIOR  MEDIASTINAL MASS (N/A) ENDOVEIN HARVEST OF GREATER SAPHENOUS VEIN (Right)  LIMA->LAD SVG->D1 L. Radial Artery->OM SVG->PLB of RCA  SURGEON:  Grace Isaac, MD - Primary  PHYSICIAN ASSISTANT:  Kyree Fedorko  ANESTHESIA:   general  EBL:  Per anesthesia and perfusion records   BLOOD ADMINISTERED:none  DRAINS: Mediastinal and left pleural tubes   LOCAL MEDICATIONS USED:  NONE  SPECIMEN:  Source of Specimen:  Anterior mediastinal mass  DISPOSITION OF SPECIMEN:  PATHOLOGY  COUNTS:  YES  DICTATION: .Dragon Dictation  PLAN OF CARE: Admit to inpatient   PATIENT DISPOSITION:  ICU - intubated and hemodynamically stable.   Delay start of Pharmacological VTE agent (>24hrs) due to surgical blood loss or risk of bleeding: yes

## 2019-09-27 NOTE — Anesthesia Procedure Notes (Addendum)
Procedure Name: Intubation Date/Time: 09/27/2019 7:41 AM Performed by: Lavell Luster, CRNA Pre-anesthesia Checklist: Patient identified, Emergency Drugs available, Suction available, Patient being monitored and Timeout performed Patient Re-evaluated:Patient Re-evaluated prior to induction Oxygen Delivery Method: Circle system utilized Preoxygenation: Pre-oxygenation with 100% oxygen Induction Type: IV induction Ventilation: Mask ventilation without difficulty Laryngoscope Size: Mac and 4 Grade View: Grade II Tube type: Oral Tube size: 7.5 mm Number of attempts: 1 Airway Equipment and Method: Stylet Placement Confirmation: ETT inserted through vocal cords under direct vision,  positive ETCO2 and breath sounds checked- equal and bilateral Secured at: 22 cm Tube secured with: Tape Dental Injury: Teeth and Oropharynx as per pre-operative assessment

## 2019-09-27 NOTE — Progress Notes (Signed)
Patient ID: Evan Rivera, male   DOB: 1960-11-19, 59 y.o.   MRN: [id number]  TCTS Evening Rounds:   Hemodynamically stable  CI = 2.4  Still on vent.  Urine output good  CT output low  CBC    Component Value Date/Time   WBC 12.7 (H) 09/27/2019 1413   RBC 3.66 (L) 09/27/2019 1413   HGB 10.5 (L) 09/27/2019 1434   HGB 14.5 08/27/2011 0848   HCT 31.0 (L) 09/27/2019 1434   HCT 42.5 08/27/2011 0848   PLT 183 09/27/2019 1413   PLT 299 08/27/2011 0848   MCV 89.9 09/27/2019 1413   MCV 90 08/27/2011 0848   MCH 30.1 09/27/2019 1413   MCHC 33.4 09/27/2019 1413   RDW 12.8 09/27/2019 1413   RDW 13.2 08/27/2011 0848   LYMPHSABS 1.4 09/26/2019 0655   LYMPHSABS 1.2 08/27/2011 0848   MONOABS 0.6 09/26/2019 0655   MONOABS 0.5 08/27/2011 0848   EOSABS 0.2 09/26/2019 0655   EOSABS 0.4 08/27/2011 0848   BASOSABS 0.0 09/26/2019 0655   BASOSABS 0.0 08/27/2011 0848     BMET    Component Value Date/Time   NA 143 09/27/2019 1434   NA 139 08/27/2011 0848   K 3.9 09/27/2019 1434   K 4.1 08/27/2011 0848   CL 105 09/27/2019 1300   CL 104 08/27/2011 0848   CO2 24 09/27/2019 0424   CO2 30 08/27/2011 0848   GLUCOSE 120 (H) 09/27/2019 1300   GLUCOSE 96 08/27/2011 0848   BUN 11 09/27/2019 1300   BUN 14 08/27/2011 0848   CREATININE 0.60 (L) 09/27/2019 1300   CREATININE 0.96 08/27/2011 0848   CALCIUM 8.7 (L) 09/27/2019 0424   CALCIUM 9.1 08/27/2011 0848   GFRNONAA >60 09/27/2019 0424   GFRNONAA >60 08/27/2011 0848   GFRAA >60 09/27/2019 0424   GFRAA >60 08/27/2011 0848     A/P:  Stable postop course. Continue current plans. Wean vent when awake.

## 2019-09-27 NOTE — Progress Notes (Signed)
Patient ready to go to OR. Wife present at bedside and will take patients glasses,cell phone and clothing along with her. All questioned answered.

## 2019-09-27 NOTE — Transfer of Care (Signed)
Immediate Anesthesia Transfer of Care Note  Patient: Torion Hulgan Riquelme  Procedure(s) Performed: CORONARY ARTERY BYPASS GRAFTING (CABG), ON PUMP, TIMES 4, USING LEFT INTERNAL MAMMARY ARTERY, LEFT RADIAL ARTERY, AND ENDOSCOPICALLY HARVESTED RIGHT GREATER SAPHENOUS VEIN. LIMA to LAD, LEFT RADIAL ARTERY to OM1, SVG to DIAG 1, SVG to PLB (N/A Chest) RADIAL ARTERY HARVEST (Left ) TRANSESOPHAGEAL ECHOCARDIOGRAM (TEE) (N/A ) RESECTION OF INCIDENTAL ANTERIOR  MEDIASTINAL MASS (N/A ) ENDOVEIN HARVEST OF GREATER SAPHENOUS VEIN (Right )  Patient Location: Nursing Unit  Anesthesia Type:General  Level of Consciousness: Patient remains intubated per anesthesia plan  Airway & Oxygen Therapy: Patient remains intubated per anesthesia plan and Patient placed on Ventilator (see vital sign flow sheet for setting)  Post-op Assessment: Report given to RN and Post -op Vital signs reviewed and stable  Post vital signs: stable  Last Vitals:  Vitals Value Taken Time  BP    Temp    Pulse    Resp    SpO2      Last Pain:  Vitals:   09/27/19 0403  TempSrc:   PainSc: Asleep      Patients Stated Pain Goal: 1 (24/46/28 6381)  Complications: No complications documented.

## 2019-09-27 NOTE — Discharge Instructions (Signed)

## 2019-09-27 NOTE — Procedures (Signed)
Extubation Procedure Note  Patient Details:   Name: Evan Rivera DOB: 1960/06/23 MRN: [id number]   Airway Documentation:    Vent end date: 09/27/19 Vent end time: 1841   Evaluation  O2 sats: stable throughout Complications: No apparent complications Patient did tolerate procedure well. Bilateral Breath Sounds: Clear, Diminished   Yes, pt was able to state his name with audible cuff leak.  Pt was extubated to [facility].  Jorje Guild 09/27/2019, 7:10 PM

## 2019-09-27 NOTE — Anesthesia Procedure Notes (Addendum)
Central Venous Catheter Insertion Performed by: Audry Pili, MD, anesthesiologist Start/End7/27/2021 6:44 AM, 09/27/2019 6:52 AM Preanesthetic checklist: patient identified, IV checked, risks and benefits discussed, surgical consent, monitors and equipment checked, pre-op evaluation, timeout performed and anesthesia consent Position: Trendelenburg Lidocaine 1% used for infiltration and patient sedated Hand hygiene performed , maximum sterile barriers used  and Seldinger technique used Catheter size: 8.5 Fr Central line was placed.Sheath introducer Procedure performed using ultrasound guided technique. Ultrasound Notes:anatomy identified, needle tip was noted to be adjacent to the nerve/plexus identified, no ultrasound evidence of intravascular and/or intraneural injection and image(s) printed for medical record Attempts: 1 Following insertion, line sutured, dressing applied and Biopatch. Post procedure assessment: blood return through all ports, free fluid flow and no air  Patient tolerated the procedure well with no immediate complications.

## 2019-09-27 NOTE — Anesthesia Postprocedure Evaluation (Signed)
Anesthesia Post Note  Patient: Evan Rivera  Procedure(s) Performed: CORONARY ARTERY BYPASS GRAFTING (CABG), ON PUMP, TIMES 4, USING LEFT INTERNAL MAMMARY ARTERY, LEFT RADIAL ARTERY, AND ENDOSCOPICALLY HARVESTED RIGHT GREATER SAPHENOUS VEIN. LIMA to LAD, LEFT RADIAL ARTERY to OM1, SVG to DIAG 1, SVG to PLB (N/A Chest) RADIAL ARTERY HARVEST (Left ) TRANSESOPHAGEAL ECHOCARDIOGRAM (TEE) (N/A ) RESECTION OF INCIDENTAL ANTERIOR  MEDIASTINAL MASS (N/A ) ENDOVEIN HARVEST OF GREATER SAPHENOUS VEIN (Right )     Patient location during evaluation: SICU Anesthesia Type: General Level of consciousness: sedated Pain management: pain level controlled Vital Signs Assessment: post-procedure vital signs reviewed and stable Respiratory status: patient remains intubated per anesthesia plan Cardiovascular status: stable Postop Assessment: no apparent nausea or vomiting Anesthetic complications: no   No complications documented.  Last Vitals:  Vitals:   09/27/19 0400 09/27/19 1412  BP: 102/75 (!) 130/79  Pulse: 50 90  Resp: 13 12  Temp:    SpO2: 97% 97%    Last Pain:  Vitals:   09/27/19 0403  TempSrc:   PainSc: Asleep                 Catalina Gravel

## 2019-09-27 NOTE — Discharge Summary (Signed)
Physician Discharge Summary  Patient ID: Evan Rivera MRN: [id number] DOB/AGE: [birth year] 59 y.o.  Admit date: 09/26/2019 Discharge date: 10/01/2019  Admission Diagnoses:  Acute non-ST elevation myocardial infarction Multivessel coronary artery disease Dyslipidemia History of statin intolerance Hypertension Gastroesophageal reflux disease Anterior mediastinal mass  Discharge Diagnoses:   Acute non-ST elevation myocardial infarction Multivessel coronary artery disease S/P CABG x 4 Dyslipidemia History of statin intolerance Gastroesophageal reflux disease Hypertension Incidental anterior mediastinal mass, benign  Discharged Condition: good  History of Present Illness:  Evan Rivera is a 59 year old male with a past history significant for gastroesophageal reflux disease, dyslipidemia, hypertension, statin allergy, and past history of tobacco use having quit smoking about 30 years ago.  He has been experiencing some epigastric pain off and on after meals for the past 4 to 5 years.  Earlier this month,  he had similar symptoms but with associated shortness of breath and radiation of pain to his jaw. He contacted his primary care physician and was advised to report to the ER.  He was seen in the emergency room on 09/09/2019 and was discharged to home after work-up revealed high-sensitivity troponin of 23 followed by 25 and an EKG showing normal sinus rhythm with some inferior changes along with "age undetermined T wave abnormality".  He came back to the emergency room last night after a much more intense episode of substernal chest pain radiating to his left jaw that was again associated with shortness of breath.  Work-up in the emergency room included an EKG that showed nonspecific T wave abnormality with a normal sinus rhythm.  Initial troponin was greater than 700 with serial troponins increasing to 4000 followed by 10,000 by early this morning.  CBC and BMP were unremarkable.  His  pain subsided shortly after arrival to the hospital but having ruled in for acute non-ST elevation myocardial infarction, he was admitted to the hospital by the cardiology service and started on nitroglycerin and heparin infusions.  He remained hemodynamically stable.  Further work-up was continued this morning with left heart catheterization that demonstrated mild global hypokinesis with ejection fraction of 50 to 55%.  There is severe three-vessel coronary artery disease demonstrated with 70 to 80% tandem lesions in the proximal and mid LAD.  The second diagonal had a 90% proximal stenosis.  The ostial circumflex coronary artery had a 90% stenosis along with a 70% stenosis in the first OM.  The proximal right coronary artery had an 85% stenosis with total occlusion at the mid RCA. 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 Heparin is currently off following the cath and he is on oral nitrates. We have been asked to evaluate Evan Rivera for consideration of operative coronary revascularization.  Hospital Course:  Evan Rivera was taken to the operating room 09/27/2019 where four-vessel coronary bypass grafting was accomplished without complication. Following the procedure, he separated from cardiopulmonary bypass without any difficulty and was transferred to the cardiovascular ICU. Patient was extubated without difficulty the evening of surgery. He remained afebrile and hemodynamically stable. He was weaned off Neo Syneprhine drip.  He was started on Imdur for his radial artery harvest.  [name], a line, chest tubes, and foley were removed early in the post op course. He had post op blood loss anemia. He did not require a post op transfusion. His last H and H was. He was volume overloaded and diuresed. He was weaned off  the Insulin drip. His pre op HGA1C was 5.6.  He is statin intolerant and was started on Zetia for lipid control.  Follow up CXR showed the  patient to have a right sided pneumothorax.  Subsequent CXR have showed improvement of right apical pneumothorax.  He was mildly tachycardic and his lopressor dose was titrated accordingly.  He remained in NSR and his pacing wires were removed without difficulty.  He is ambulating independently.  His incisions are healing without evidence of infection.  He is medically stable for discharge home today.   Significant Diagnostic Studies:   LEFT HEART CATH AND CORONARY ANGIOGRAPHY  Conclusion    Prox LAD lesion is 70% stenosed.  Mid LAD lesion is 80% stenosed.  2nd Diag lesion is 90% stenosed.  Ost Cx to Prox Cx lesion is 90% stenosed.  1st Mrg lesion is 70% stenosed.  Prox RCA lesion is 85% stenosed.  Prox RCA to Mid RCA lesion is 100% stenosed.  There is mild left ventricular systolic dysfunction.  LV end diastolic pressure is mildly elevated.  The left ventricular ejection fraction is 50-55% by visual estimate.   1. Severe 3 vessel obstructive CAD 2. Mild LV impairment. EF 50% with global HK. 3. Mildly elevated LVEDP  Plan: CT surgery consultation for CABG.  Coronary Findings  Diagnostic Dominance: Right Left Main  Vessel was injected. Vessel is normal in caliber. Vessel is angiographically normal.  Left Anterior Descending  Prox LAD lesion is 70% stenosed. The lesion is moderately calcified.  Mid LAD lesion is 80% stenosed. The lesion is moderately calcified.  First Diagonal Branch  Vessel is small in size.  Second Diagonal Branch  2nd Diag lesion is 90% stenosed.  Left Circumflex  Ost Cx to Prox Cx lesion is 90% stenosed.  First Obtuse Marginal Branch  Vessel is large in size.  1st Mrg lesion is 70% stenosed.  Right Coronary Artery  Prox RCA lesion is 85% stenosed.  Prox RCA to Mid RCA lesion is 100% stenosed. The lesion is chronically occluded with left-to-right and right-to-right collateral flow.  Acute Marginal Branch  Collaterals  Acute Mrg filled by  collaterals from Prox RCA.    Right Posterior Descending Artery  Collaterals  RPDA filled by collaterals from Dist LAD.    Intervention  No interventions have been documented. Wall Motion  Resting               Left Heart  Left Ventricle The left ventricular size is normal. There is mild left ventricular systolic dysfunction. LV end diastolic pressure is mildly elevated. The left ventricular ejection fraction is 50-55% by visual estimate. There are LV function abnormalities due to global hypokinesis.  Coronary Diagrams  Diagnostic Dominance: Right      ECHOCARDIOGRAM REPORT       Patient Name:  JARIS KOHLES [facility] Date of Exam: 09/26/2019  Medical Rec #: [id number]    Height:    64.0 in  Accession #:  [id number]   Weight:    160.3 lb  Date of Birth: 26-Oct-1960    BSA:     1.781 m  Patient Age:  20 years    BP:      127/91 mmHg  Patient Gender: M        HR:      64 bpm.  Exam Location: Inpatient   Procedure: 2D Echo, 3D Echo, Color Doppler and Cardiac Doppler   Indications:  R07.9* Chest pain, unspecified    History:    Patient  has no prior history of Echocardiogram  examinations.         NSTEMI; Risk Factors:Hypertension and Dyslipidemia.    Sonographer:  Raquel Sarna Senior RDCS  Referring Phys: [place]    1. Left ventricular ejection fraction, by estimation, is 55 to 60%. The  left ventricle has normal function. The left ventricle has no regional  wall motion abnormalities. Left ventricular diastolic parameters were  normal.  2. Right ventricular systolic function is normal. The right ventricular  size is normal. Tricuspid regurgitation signal is inadequate for assessing  PA pressure.  3. The mitral valve is grossly normal. Trivial mitral valve  regurgitation. No evidence of mitral stenosis.  4. The aortic valve is tricuspid. Aortic valve regurgitation is trivial.    No aortic stenosis is present.  5. The inferior vena cava is dilated in size with <50% respiratory  variability, suggesting right atrial pressure of 15 mmHg.   FINDINGS  Left Ventricle: Left ventricular ejection fraction, by estimation, is 55  to 60%. The left ventricle has normal function. The left ventricle has no  regional wall motion abnormalities. The left ventricular internal cavity  size was normal in size. There is  no left ventricular hypertrophy. Left ventricular diastolic parameters  were normal.   Right Ventricle: The right ventricular size is normal. No increase in  right ventricular wall thickness. Right ventricular systolic function is  normal. Tricuspid regurgitation signal is inadequate for assessing PA  pressure.   Left Atrium: Left atrial size was normal in size.   Right Atrium: Right atrial size was normal in size.   Pericardium: Trivial pericardial effusion is present.   Mitral Valve: The mitral valve is grossly normal. Trivial mitral valve  regurgitation. No evidence of mitral valve stenosis.   Tricuspid Valve: The tricuspid valve is grossly normal. Tricuspid valve  regurgitation is trivial. No evidence of tricuspid stenosis.   Aortic Valve: The aortic valve is tricuspid. Aortic valve regurgitation is  trivial. No aortic stenosis is present.   Pulmonic Valve: The pulmonic valve was grossly normal. Pulmonic valve  regurgitation is not visualized. No evidence of pulmonic stenosis.   Aorta: The aortic root is normal in size and structure.   Venous: The inferior vena cava is dilated in size with less than 50%  respiratory variability, suggesting right atrial pressure of 15 mmHg.   IAS/Shunts: The atrial septum is grossly normal.     LEFT VENTRICLE  PLAX 2D  LVIDd:     4.80 cm Diastology  LVIDs:     3.10 cm LV e' lateral:  10.70 cm/s  LV PW:     0.90 cm LV E/e' lateral: 8.0  LV IVS:    0.80 cm LV e' medial:  6.64 cm/s   LVOT diam:   2.20 cm LV E/e' medial: 12.9  LV SV:     78  LV SV Index:  44  LVOT Area:   3.80 cm     RIGHT VENTRICLE  RV S prime:   12.40 cm/s  TAPSE (M-mode): 2.2 cm   LEFT ATRIUM       Index    RIGHT ATRIUM      Index  LA diam:    3.50 cm 1.97 cm/m RA Area:   14.00 cm  LA Vol (A2C):  64.7 ml 36.33 ml/m RA Volume:  29.80 ml 16.74 ml/m  LA Vol (A4C):  38.5 ml 21.62 ml/m  LA Biplane Vol: 52.5 ml 29.48 ml/m  AORTIC VALVE  LVOT Vmax:  119.00 cm/s  LVOT Vmean: 70.600 cm/s  LVOT VTI:  0.205 m    AORTA  Ao Root diam: 3.50 cm   MITRAL VALVE  MV Area (PHT): 3.65 cm  SHUNTS  MV Decel Time: 208 msec  Systemic VTI: 0.20 m  MV E velocity: 85.90 cm/s Systemic Diam: 2.20 cm  MV A velocity: 74.40 cm/s  MV E/A ratio: 1.15   Eleonore Chiquito MD  Electronically signed by Eleonore Chiquito MD  Signature Date/Time: 09/26/2019/4:40:25 PM     CT CHEST WITHOUT CONTRAST  TECHNIQUE: Multidetector CT imaging of the chest was performed following the standard protocol without IV contrast.  COMPARISON:  09/26/2019.  09/09/2019.  FINDINGS: Cardiovascular: The heart size is normal. No substantial pericardial effusion. Coronary artery calcification is evident. Atherosclerotic calcification is noted in the wall of the thoracic aorta.  Mediastinum/Nodes: 3.4 x 2.3 x 2.4 cm upper anterior mediastinal mass is identified. This appears to be exophytic off the inferior thymic isthmus is directed caudally behind the sternum and anterior to the trachea. No other mediastinal lymphadenopathy. No evidence for gross hilar lymphadenopathy although assessment is limited by the lack of intravenous contrast on today's study. The esophagus has normal imaging features. There is no axillary lymphadenopathy.  Lungs/Pleura: 3 mm left lower lobe perifissural nodule has triangular configuration consistent with lymph node. No anterior lung nodule or mass  as suggested on chest x-ray of 09/09/2019. No focal airspace consolidation. No pleural effusion.  Upper Abdomen: Left upper pole renal stone has been incompletely visualized.  Musculoskeletal: No worrisome lytic or sclerotic osseous abnormality.  IMPRESSION: 1. 3.4 cm upper anterior mediastinal lesion, likely exophytic off the inferior thymic isthmus. This would probably not be well seen on ultrasound due to retrosternal location. CT chest with contrast material or MRI without and with contrast may provide better characterization of the relationship to the thymic isthmus. Although lymphadenopathy or primary anterior mediastinal mass lesion are considered less likely, these possibilities are not excluded and close follow-up is recommended. This finding does not represent the opacity seen on the lateral chest x-ray of 09/09/2019. 2. No anterior lung mass as suggested on lateral chest x-ray of 09/09/2019. 3. Left upper pole renal stone, incompletely visualized. 4. Aortic Atherosclerosis (ICD10-I70.0).   Electronically Signed   By: Misty Stanley M.D.   On: 09/26/2019 13:55  Treatments: surgery:   CORONARY ARTERY BYPASS GRAFTING (CABG), ON PUMP, TIMES 4, USING LEFT INTERNAL MAMMARY ARTERY, LEFT RADIAL ARTERY, AND ENDOSCOPICALLY HARVESTED RIGHT GREATER SAPHENOUS VEIN (N/A) RADIAL ARTERY HARVEST (Left) TRANSESOPHAGEAL ECHOCARDIOGRAM (TEE) (N/A) RESECTION OF INCIDENTAL ANTERIOR  MEDIASTINAL MASS (N/A) ENDOVEIN HARVEST OF GREATER SAPHENOUS VEIN (Right)  LIMA->LAD SVG->D1 L. Radial Artery->OM SVG->PLB of RCA  Discharge Exam: Blood pressure (!) 122/94, pulse 99, temperature 99.1 F (37.3 C), temperature source Oral, resp. rate 20, height 5\' 6"  (1.676 m), weight 73.8 kg, SpO2 95 %.  General appearance: alert, cooperative and no distress Heart: regular rate and rhythm Lungs: clear to auscultation bilaterally Abdomen: soft, non-tender; bowel sounds normal; no masses,  no  organomegaly Extremities: edema trace Wound: clean and dry  Discharge Medications:  Allergies as of 10/01/2019      Reactions   Statins Anaphylaxis   Pt does not remember which statin caused the reaction   Drug [tape] Hives   Reaction to some forms of bandaids - please use paper tape      Medication List    STOP taking these medications   Garlic 5916 MG Caps  GOODY HEADACHE PO   ibuprofen 200 MG tablet Commonly known as: ADVIL   valsartan 160 MG tablet Commonly known as: Diovan     TAKE these medications   acetaminophen 500 MG tablet Commonly known as: TYLENOL Take 2 tablets (1,000 mg total) by mouth every 6 (six) hours as needed for mild pain, fever or headache.   aspirin 81 MG chewable tablet Chew 1 tablet (81 mg total) by mouth daily.   cholecalciferol 25 MCG (1000 UNIT) tablet Commonly known as: VITAMIN D Take 2,000 Units by mouth at bedtime.   clopidogrel 75 MG tablet Commonly known as: PLAVIX Take 1 tablet (75 mg total) by mouth daily.   ezetimibe 10 MG tablet Commonly known as: ZETIA Take 1 tablet (10 mg total) by mouth at bedtime.   isosorbide mononitrate 30 MG 24 hr tablet Commonly known as: IMDUR Take 1 tablet (30 mg total) by mouth daily.   metoprolol tartrate 25 MG tablet Commonly known as: LOPRESSOR Take 1 tablet (25 mg total) by mouth 2 (two) times daily.   oxyCODONE 5 MG immediate release tablet Commonly known as: Oxy IR/ROXICODONE Take 1 tablet (5 mg total) by mouth every 4 (four) hours as needed for severe pain.   vitamin C 1000 MG tablet Take 2,000 mg by mouth at bedtime.   ZINC PO Take 1 tablet by mouth at bedtime.       Follow-up Information    Grace Isaac, MD. Go on 11/03/2019.   Specialty: Cardiothoracic Surgery Why: PA/LAT CXR to be taken (at [facility] which is in the same building as Dr. Everrett Coombe office) on 09/02 at 12:00 pm;Appointment time is at 12:30 pm Contact information: [place] [address] [phone]        [facility], [place], [place]. Go on 10/17/2019.   Specialty: Cardiology Why: Appointment is at 9:15 am Contact information: [address] [place] [address] [phone]        [facility] Follow up on 10/10/2019.   Specialty: Cardiology Why: Please arrive 15 minutes early for your 3:30pm [facility] appointment. You will discuss options for managing your high cholesterol at this visit.  Contact information: [address], [place] [phone]       [facility] Follow up in 2 week(s).   Specialty: Cardiothoracic Surgery Why: Appointment with Dr. Servando Snare, please get CXR 30 min prior to your appointment at [facility] located on first floor of our office building.... office should contact you with appointment date and time Contact information: [place], [place] [phone]             The patient has been discharged on:   1.Beta Blocker:  Yes [ X  ]                              No   [   ]                              If No, reason:  2.Ace Inhibitor/ARB: Yes [   ]  No  [ x   ]                                     If No, reason: labile BP  3.Statin:   Yes [   ]                  No  [  X ]                  If No, reason: intolerance  4.Shela CommonsVelta Addison  [ X  ]                  No   [   ]                  If No, reason:    Signed:  [place] Nordquist, PA-C 10/01/2019, 8:24 AM

## 2019-09-28 ENCOUNTER — Encounter (HOSPITAL_COMMUNITY): Payer: Self-pay | Admitting: Cardiothoracic Surgery

## 2019-09-28 ENCOUNTER — Inpatient Hospital Stay (HOSPITAL_COMMUNITY)

## 2019-09-28 LAB — BASIC METABOLIC PANEL
Anion gap: 8 (ref 5–15)
Anion gap: 4 — ABNORMAL LOW (ref 5–15)
BUN: 10 mg/dL (ref 6–20)
BUN: 7 mg/dL (ref 6–20)
CO2: 22 mmol/L (ref 22–32)
CO2: 17 mmol/L — ABNORMAL LOW (ref 22–32)
Calcium: 8.1 mg/dL — ABNORMAL LOW (ref 8.9–10.3)
Calcium: 5.2 mg/dL — CL (ref 8.9–10.3)
Chloride: 105 mmol/L (ref 98–111)
Chloride: 118 mmol/L — ABNORMAL HIGH (ref 98–111)
Creatinine, Ser: 0.73 mg/dL (ref 0.61–1.24)
Creatinine, Ser: 0.38 mg/dL — ABNORMAL LOW (ref 0.61–1.24)
GFR calc Af Amer: >60 mL/min (ref >60)
GFR calc Af Amer: >60 mL/min (ref >60)
GFR calc non Af Amer: >60 mL/min (ref >60)
GFR calc non Af Amer: >60 mL/min (ref >60)
Glucose, Bld: 112 mg/dL — ABNORMAL HIGH (ref 70–99)
Glucose, Bld: 77 mg/dL (ref 70–99)
Potassium: 4 mmol/L (ref 3.5–5.1)
Potassium: 2.7 mmol/L — CL (ref 3.5–5.1)
Sodium: 135 mmol/L (ref 135–145)
Sodium: 139 mmol/L (ref 135–145)

## 2019-09-28 LAB — CBC
HCT: 31 % — ABNORMAL LOW (ref 39.0–52.0)
HCT: 22.5 % — ABNORMAL LOW (ref 39.0–52.0)
Hemoglobin: 10.6 g/dL — ABNORMAL LOW (ref 13.0–17.0)
Hemoglobin: 7.3 g/dL — ABNORMAL LOW (ref 13.0–17.0)
MCH: 30.9 pg (ref 26.0–34.0)
MCH: 30.2 pg (ref 26.0–34.0)
MCHC: 34.2 g/dL (ref 30.0–36.0)
MCHC: 32.4 g/dL (ref 30.0–36.0)
MCV: 90.4 fL (ref 80.0–100.0)
MCV: 93 fL (ref 80.0–100.0)
Platelets: 212 10*3/uL (ref 150–400)
Platelets: 142 10*3/uL — ABNORMAL LOW (ref 150–400)
RBC: 3.43 MIL/uL — ABNORMAL LOW (ref 4.22–5.81)
RBC: 2.42 MIL/uL — ABNORMAL LOW (ref 4.22–5.81)
RDW: 13 % (ref 11.5–15.5)
RDW: 13 % (ref 11.5–15.5)
WBC: 10.6 10*3/uL — ABNORMAL HIGH (ref 4.0–10.5)
WBC: 9 10*3/uL (ref 4.0–10.5)
nRBC: 0 % (ref 0.0–0.2)
nRBC: 0 % (ref 0.0–0.2)

## 2019-09-28 LAB — MAGNESIUM
Magnesium: 2 mg/dL (ref 1.7–2.4)
Magnesium: 1.2 mg/dL — ABNORMAL LOW (ref 1.7–2.4)

## 2019-09-28 LAB — GLUCOSE, CAPILLARY
Glucose-Capillary: 101 mg/dL — ABNORMAL HIGH (ref 70–99)
Glucose-Capillary: 102 mg/dL — ABNORMAL HIGH (ref 70–99)
Glucose-Capillary: 110 mg/dL — ABNORMAL HIGH (ref 70–99)
Glucose-Capillary: 91 mg/dL (ref 70–99)
Glucose-Capillary: 94 mg/dL (ref 70–99)
Glucose-Capillary: 95 mg/dL (ref 70–99)
Glucose-Capillary: 96 mg/dL (ref 70–99)
Glucose-Capillary: 99 mg/dL (ref 70–99)

## 2019-09-28 LAB — SURGICAL PATHOLOGY

## 2019-09-28 MED ORDER — INSULIN ASPART 100 UNIT/ML ~~LOC~~ SOLN
0.0000 [IU] | SUBCUTANEOUS | Status: DC
  Administered 2019-09-29: 2 [IU] via SUBCUTANEOUS

## 2019-09-28 MED ORDER — POTASSIUM CHLORIDE 10 MEQ/50ML IV SOLN
10.0000 meq | INTRAVENOUS | Status: AC
  Administered 2019-09-28 (×5): 10 meq via INTRAVENOUS
  Filled 2019-09-28 (×5): qty 50

## 2019-09-28 MED ORDER — ENOXAPARIN SODIUM 40 MG/0.4ML ~~LOC~~ SOLN
40.0000 mg | Freq: Every day | SUBCUTANEOUS | Status: DC
  Administered 2019-09-28 – 2019-09-30 (×3): 40 mg via SUBCUTANEOUS
  Filled 2019-09-28 (×3): qty 0.4

## 2019-09-28 MED ORDER — MAGNESIUM SULFATE 4 GM/100ML IV SOLN
4.0000 g | Freq: Once | INTRAVENOUS | Status: AC
  Administered 2019-09-28: 4 g via INTRAVENOUS
  Filled 2019-09-28: qty 100

## 2019-09-28 MED ORDER — SODIUM CHLORIDE 0.9% FLUSH: 10.0000 mL | INTRAVENOUS | Status: DC | PRN

## 2019-09-28 MED ORDER — SODIUM CHLORIDE 0.9 % IV SOLN
2.0000 g | Freq: Once | INTRAVENOUS | Status: AC
  Administered 2019-09-28: 2 g via INTRAVENOUS
  Filled 2019-09-28: qty 20

## 2019-09-28 MED ORDER — SODIUM CHLORIDE 0.9% FLUSH
10.0000 mL | Freq: Two times a day (BID) | INTRAVENOUS | Status: DC
  Administered 2019-09-29 (×2): 10 mL

## 2019-09-28 NOTE — Progress Notes (Signed)
Patient ID: Evan Rivera, male   DOB: [birth year], 59 y.o.   MRN: [id number] TCTS DAILY ICU PROGRESS NOTE                   [address]            [place] [address]          [phone]   1 Day Post-Op Procedure(s) (LRB): CORONARY ARTERY BYPASS GRAFTING (CABG), ON PUMP, TIMES 4, USING LEFT INTERNAL MAMMARY ARTERY, LEFT RADIAL ARTERY, AND ENDOSCOPICALLY HARVESTED RIGHT GREATER SAPHENOUS VEIN. LIMA to LAD, LEFT RADIAL ARTERY to OM1, SVG to DIAG 1, SVG to PLB (N/A) RADIAL ARTERY HARVEST (Left) TRANSESOPHAGEAL ECHOCARDIOGRAM (TEE) (N/A) RESECTION OF INCIDENTAL ANTERIOR  MEDIASTINAL MASS (N/A) ENDOVEIN HARVEST OF GREATER SAPHENOUS VEIN (Right)  Total Length of Stay:  LOS: 2 days   Subjective: Patient awake alert neurologically intact, extubated last night without difficulty On low-dose neocurrently  Objective: Vital signs in last 24 hours: Temp:  [98.7 F (37.1 C)-98.9 F (37.2 C)] 98.9 F (37.2 C) (07/28 0000) Pulse Rate:  [81-99] 90 (07/28 0600) Cardiac Rhythm: Normal sinus rhythm (07/27 2000) Resp:  [12-22] 13 (07/28 0600) BP: (91-130)/(68-84) 105/73 (07/28 0600) SpO2:  [93 %-100 %] 97 % (07/28 0600) Arterial Line BP: (79-140)/(52-92) 89/65 (07/28 0600) FiO2 (%):  [40 %-50 %] 40 % (07/27 1741) Weight:  [77.3 kg] 77.3 kg (07/28 0500)  Filed Weights   09/26/19 0945 09/27/19 0333 09/28/19 0500  Weight: 72.7 kg 72.5 kg 77.3 kg    Weight change: 4.6 kg   Hemodynamic parameters for last 24 hours:    Intake/Output from previous day: 07/27 0701 - 07/28 0700 In: 4144.5 [I.V.:3083.3; Blood:300; IV Piggyback:761.2] Out: 4060 [Urine:3080; Blood:600; Chest Tube:380]  Intake/Output this shift: Total I/O In: 736.4 [I.V.:473.6; IV Piggyback:262.9] Out: 905 [Urine:705; Chest Tube:200]  Current Meds: Scheduled Meds: . acetaminophen  1,000 mg Oral Q6H   Or  . acetaminophen (TYLENOL) oral liquid 160 mg/5 mL  1,000 mg Per Tube Q6H  . aspirin EC  325 mg Oral  Daily   Or  . aspirin  324 mg Per Tube Daily  . bisacodyl  10 mg Oral Daily   Or  . bisacodyl  10 mg Rectal Daily  . Chlorhexidine Gluconate Cloth  6 each Topical Daily  . Chlorhexidine Gluconate Cloth  6 each Topical Daily  . docusate sodium  200 mg Oral Daily  . insulin aspart  0-24 Units Subcutaneous Q4H  . isosorbide mononitrate  30 mg Oral Daily  . metoprolol tartrate  12.5 mg Oral BID   Or  . metoprolol tartrate  12.5 mg Per Tube BID  . [START ON 09/29/2019] pantoprazole  40 mg Oral Daily  . sodium chloride flush  10-40 mL Intracatheter Q12H  . sodium chloride flush  10-40 mL Intracatheter Q12H  . sodium chloride flush  3 mL Intravenous Q12H   Continuous Infusions: . sodium chloride Stopped (09/28/19 0441)  . sodium chloride    . sodium chloride    . albumin human 12.5 g (09/27/19 1547)  . cefUROXime (ZINACEF)  IV 200 mL/hr at 09/28/19 0500  . dexmedetomidine (PRECEDEX) IV infusion 0.2 mcg/kg/hr (09/28/19 0500)  . famotidine (PEPCID) IV 100 mL/hr at 09/27/19 1429  . lactated ringers    . lactated ringers    . lactated ringers 20 mL/hr at 09/28/19 0500  . nitroGLYCERIN 7 mcg/min (09/28/19 0500)  . phenylephrine (NEO-SYNEPHRINE) Adult infusion 10 mcg/min (09/28/19 0500)  PRN Meds:.sodium chloride, albumin human, lactated ringers, metoprolol tartrate, midazolam, morphine injection, ondansetron (ZOFRAN) IV, oxyCODONE, sodium chloride flush, sodium chloride flush, sodium chloride flush, traMADol  General appearance: alert, cooperative and no distress Neurologic: intact Heart: regular rate and rhythm, S1, S2 normal, no murmur, click, rub or gallop Lungs: diminished breath sounds bibasilar Abdomen: soft, non-tender; bowel sounds normal; no masses,  no organomegaly Extremities: extremities normal, atraumatic, no cyanosis or edema Wound: Sternum stable, left radial harvest sites intact left hand is neurovascularly intact  Lab Results: CBC: Recent Labs    09/27/19 2000  09/28/19 0442  WBC 11.0* 10.6*  HGB 11.0* 10.6*  HCT 33.1* 31.0*  PLT 186 212   BMET:  Recent Labs    09/27/19 2000 09/28/19 0442  NA 138 135  K 4.1 4.0  CL 108 105  CO2 21* 22  GLUCOSE 114* 112*  BUN 11 10  CREATININE 0.68 0.73  CALCIUM 8.2* 8.1*    CMET: Lab Results  Component Value Date   WBC 10.6 (H) 09/28/2019   HGB 10.6 (L) 09/28/2019   HCT 31.0 (L) 09/28/2019   PLT 212 09/28/2019   GLUCOSE 112 (H) 09/28/2019   CHOL 191 09/26/2019   TRIG 164 (H) 09/26/2019   HDL 40 (L) 09/26/2019   LDLDIRECT 97.0 09/13/2019   LDLCALC 118 (H) 09/26/2019   ALT 28 09/26/2019   AST 60 (H) 09/26/2019   NA 135 09/28/2019   K 4.0 09/28/2019   CL 105 09/28/2019   CREATININE 0.73 09/28/2019   BUN 10 09/28/2019   CO2 22 09/28/2019   TSH 1.054 09/26/2019   PSA 0.30 09/13/2019   INR 1.3 (H) 09/27/2019   HGBA1C 5.6 09/26/2019      PT/INR:  Recent Labs    09/27/19 1413  LABPROT 15.7*  INR 1.3*   Radiology: [facility] Chest Port 1 View  Result Date: 09/27/2019 CLINICAL DATA:  Status post coronary bypass graft. EXAM: PORTABLE CHEST 1 VIEW COMPARISON:  September 26, 2019. FINDINGS: The heart size and mediastinal contours are within normal limits. Status post coronary bypass graft. Endotracheal and nasogastric tubes are in grossly good position. Right internal jugular catheter is noted with tip in expected position of the SVC. Left-sided chest tube is noted without definite pneumothorax. Mild bibasilar subsegmental atelectasis is noted. The visualized skeletal structures are unremarkable. IMPRESSION: Left-sided chest tube is noted without definite pneumothorax. Mild bibasilar subsegmental atelectasis is noted. Endotracheal and nasogastric tubes are in grossly good position. Electronically Signed   By: Marijo Conception M.D.   On: 09/27/2019 15:11   ECHO INTRAOPERATIVE TEE  Result Date: 09/27/2019  *INTRAOPERATIVE TRANSESOPHAGEAL REPORT *  Patient Name:   HARL WIECHMANN [facility] Date of Exam: 09/27/2019  Medical Rec #:  [id number]       Height:       64.0 in Accession #:    [id number]      Weight:       159.8 lb Date of Birth:  26-Mar-1960       BSA:          1.78 m Patient Age:    57 years        BP:           102/75 mmHg Patient Gender: M               HR:           50 bpm. Exam Location:  Anesthesiology Transesophogeal exam was perform intraoperatively during surgical procedure. Patient was  closely monitored under general anesthesia during the entirety of examination. Indications:     CAD Sonographer:     Dustin Flock RDCS Performing Phys: [id number] Lilia Argue RAXENMMH Diagnosing Phys: Hoy Morn MD Complications: No known complications during this procedure. POST-OP IMPRESSIONS - Left Ventricle: The left ventricle is unchanged from pre-bypass. - Right Ventricle: The right ventricle appears unchanged from pre-bypass. - Aorta: The aorta appears unchanged from pre-bypass. - Left Atrium: The left atrium appears unchanged from pre-bypass. - Left Atrial Appendage: The left atrial appendage appears unchanged from pre-bypass. - Aortic Valve: The aortic valve appears unchanged from pre-bypass. - Mitral Valve: The mitral valve appears unchanged from pre-bypass. - Tricuspid Valve: The tricuspid valve appears unchanged from pre-bypass. - Interatrial Septum: The interatrial septum appears unchanged from pre-bypass. - Interventricular Septum: The interventricular septum appears unchanged from pre-bypass. - Pericardium: The pericardium appears unchanged from pre-bypass. PRE-OP FINDINGS  Left Ventricle: The left ventricle has normal systolic function, with an ejection fraction of 55-60%. The cavity size was normal. There is no increase in left ventricular wall thickness. No evidence of left ventricular regional wall motion abnormalities. Right Ventricle: The right ventricle has normal systolic function. The cavity was normal. There is no increase in right ventricular wall thickness. Left Atrium: Left atrial size was normal in  size. The left atrial appendage is well visualized and there is no evidence of thrombus present. Right Atrium: Right atrial size was normal in size. Interatrial Septum: No atrial level shunt detected by color flow Doppler. Pericardium: There is no evidence of pericardial effusion. Mitral Valve: The mitral valve is normal in structure. Mitral valve regurgitation is mild by color flow Doppler. The MR jet is centrally-directed. There is No evidence of mitral stenosis. Tricuspid Valve: The tricuspid valve was normal in structure. Tricuspid valve regurgitation is trivial by color flow Doppler. Aortic Valve: The aortic valve is tricuspid Aortic valve regurgitation is trivial by color flow Doppler. There is no evidence of aortic valve stenosis. Pulmonic Valve: The pulmonic valve was normal in structure. Pulmonic valve regurgitation is not visualized by color flow Doppler. Aorta: The aortic root, ascending aorta and aortic arch are normal in size and structure. Pulmonary Artery: The pulmonary artery is of normal size.  Hoy Morn MD Electronically signed by Hoy Morn MD Signature Date/Time: 09/27/2019/4:48:16 PM    Final      Assessment/Plan: S/P Procedure(s) (LRB): CORONARY ARTERY BYPASS GRAFTING (CABG), ON PUMP, TIMES 4, USING LEFT INTERNAL MAMMARY ARTERY, LEFT RADIAL ARTERY, AND ENDOSCOPICALLY HARVESTED RIGHT GREATER SAPHENOUS VEIN. 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

## 2019-09-28 NOTE — Plan of Care (Signed)
  Problem: Education: Goal: Knowledge of disease or condition will improve Outcome: Progressing Goal: Knowledge of the prescribed therapeutic regimen will improve Outcome: Progressing   Problem: Activity: Goal: Risk for activity intolerance will decrease Outcome: Progressing   Problem: Clinical Measurements: Goal: Postoperative complications will be avoided or minimized Outcome: Progressing   Problem: Respiratory: Goal: Respiratory status will improve Outcome: Progressing

## 2019-09-28 NOTE — Progress Notes (Signed)
EVENING ROUNDS NOTE :     [address]       [place] [address]             [phone]                 1 Day Post-Op Procedure(s) (LRB): CORONARY ARTERY BYPASS GRAFTING (CABG), ON PUMP, TIMES 4, USING LEFT INTERNAL MAMMARY ARTERY, LEFT RADIAL ARTERY, AND ENDOSCOPICALLY HARVESTED RIGHT GREATER SAPHENOUS VEIN. LIMA to LAD, LEFT RADIAL ARTERY to OM1, SVG to DIAG 1, SVG to PLB (N/A) RADIAL ARTERY HARVEST (Left) TRANSESOPHAGEAL ECHOCARDIOGRAM (TEE) (N/A) RESECTION OF INCIDENTAL ANTERIOR  MEDIASTINAL MASS (N/A) ENDOVEIN HARVEST OF GREATER SAPHENOUS VEIN (Right)   Total Length of Stay:  LOS: 2 days  Events:   No events Doing well     BP (!) 108/64   Pulse 91   Temp 98 F (36.7 C) (Oral)   Resp 22   Ht 5\' 6"  (1.676 m)   Wt 77.3 kg   SpO2 97%   BMI 27.51 kg/m      Vent Mode: CPAP;PSV FiO2 (%):  [40 %] 40 % Set Rate:  [4 bmp] 4 bmp Pressure Support:  [10 cmH20] 10 cmH20  . sodium chloride Stopped (09/28/19 1021)  . sodium chloride    . sodium chloride    . cefUROXime (ZINACEF)  IV 1.5 g (09/28/19 1630)  . dexmedetomidine (PRECEDEX) IV infusion Stopped (09/28/19 0553)  . lactated ringers    . lactated ringers    . lactated ringers Stopped (09/28/19 1021)  . nitroGLYCERIN Stopped (09/28/19 1022)  . phenylephrine (NEO-SYNEPHRINE) Adult infusion Stopped (09/28/19 1021)    I/O last 3 completed shifts: In: 4702.9 [I.V.:3292.1; Blood:300; IV Piggyback:1110.8] Out: 6060 [Urine:5080; Blood:600; Chest Tube:380]   CBC Latest Ref Rng & Units 09/28/2019 09/27/2019 09/27/2019  WBC 4.0 - 10.5 K/uL 10.6(H) 11.0(H) -  Hemoglobin 13.0 - 17.0 g/dL 10.6(L) 11.0(L) 9.9(L)  Hematocrit 39 - 52 % 31.0(L) 33.1(L) 29.0(L)  Platelets 150 - 400 K/uL 212 186 -    BMP Latest Ref Rng & Units 09/28/2019 09/27/2019 09/27/2019  Glucose 70 - 99 mg/dL 112(H) 114(H) -  BUN 6 - 20 mg/dL 10 11 -  Creatinine 0.61 - 1.24 mg/dL 0.73 0.68 -  Sodium 135 - 145 mmol/L 135 138 143    Potassium 3.5 - 5.1 mmol/L 4.0 4.1 4.2  Chloride 98 - 111 mmol/L 105 108 -  CO2 22 - 32 mmol/L 22 21(L) -  Calcium 8.9 - 10.3 mg/dL 8.1(L) 8.2(L) -    ABG    Component Value Date/Time   PHART 7.352 09/27/2019 1840   PCO2ART 37.2 09/27/2019 1840   PO2ART 91 09/27/2019 1840   HCO3 20.7 09/27/2019 1840   TCO2 22 09/27/2019 1840   ACIDBASEDEF 4.0 (H) 09/27/2019 1840   O2SAT 97.0 09/27/2019 1840       Melodie Bouillon, MD 09/28/2019 4:36 PM

## 2019-09-29 ENCOUNTER — Inpatient Hospital Stay (HOSPITAL_COMMUNITY)

## 2019-09-29 LAB — POCT I-STAT 7, (LYTES, BLD GAS, ICA,H+H)
Acid-base deficit: 3 mmol/L — ABNORMAL HIGH (ref 0.0–2.0)
Bicarbonate: 22.2 mmol/L (ref 20.0–28.0)
Calcium, Ion: 1.18 mmol/L (ref 1.15–1.40)
HCT: 30 % — ABNORMAL LOW (ref 39.0–52.0)
Hemoglobin: 10.2 g/dL — ABNORMAL LOW (ref 13.0–17.0)
O2 Saturation: 97 %
Patient temperature: 98.7
Potassium: 4 mmol/L (ref 3.5–5.1)
Sodium: 141 mmol/L (ref 135–145)
TCO2: 23 mmol/L (ref 22–32)
pCO2 arterial: 39.3 mmHg (ref 32.0–48.0)
pH, Arterial: 7.36 (ref 7.350–7.450)
pO2, Arterial: 91 mmHg (ref 83.0–108.0)

## 2019-09-29 LAB — CBC
HCT: 30.4 % — ABNORMAL LOW (ref 39.0–52.0)
Hemoglobin: 10.4 g/dL — ABNORMAL LOW (ref 13.0–17.0)
MCH: 31.6 pg (ref 26.0–34.0)
MCHC: 34.2 g/dL (ref 30.0–36.0)
MCV: 92.4 fL (ref 80.0–100.0)
Platelets: 190 10*3/uL (ref 150–400)
RBC: 3.29 MIL/uL — ABNORMAL LOW (ref 4.22–5.81)
RDW: 12.9 % (ref 11.5–15.5)
WBC: 13.4 10*3/uL — ABNORMAL HIGH (ref 4.0–10.5)
nRBC: 0 % (ref 0.0–0.2)

## 2019-09-29 LAB — BASIC METABOLIC PANEL
Anion gap: 7 (ref 5–15)
BUN: 9 mg/dL (ref 6–20)
CO2: 25 mmol/L (ref 22–32)
Calcium: 8.5 mg/dL — ABNORMAL LOW (ref 8.9–10.3)
Chloride: 99 mmol/L (ref 98–111)
Creatinine, Ser: 0.76 mg/dL (ref 0.61–1.24)
GFR calc Af Amer: >60 mL/min (ref >60)
GFR calc non Af Amer: >60 mL/min (ref >60)
Glucose, Bld: 101 mg/dL — ABNORMAL HIGH (ref 70–99)
Potassium: 4.5 mmol/L (ref 3.5–5.1)
Sodium: 131 mmol/L — ABNORMAL LOW (ref 135–145)

## 2019-09-29 LAB — GLUCOSE, CAPILLARY
Glucose-Capillary: 105 mg/dL — ABNORMAL HIGH (ref 70–99)
Glucose-Capillary: 138 mg/dL — ABNORMAL HIGH (ref 70–99)
Glucose-Capillary: 92 mg/dL (ref 70–99)
Glucose-Capillary: 168 mg/dL — ABNORMAL HIGH (ref 70–99)
Glucose-Capillary: 70 mg/dL (ref 70–99)

## 2019-09-29 MED ORDER — EZETIMIBE 10 MG PO TABS
10.0000 mg | ORAL_TABLET | Freq: Every day | ORAL | Status: DC
  Administered 2019-09-29 – 2019-09-30 (×2): 10 mg via ORAL
  Filled 2019-09-29 (×2): qty 1

## 2019-09-29 MED ORDER — ~~LOC~~ CARDIAC SURGERY, PATIENT & FAMILY EDUCATION: Freq: Once | Status: AC

## 2019-09-29 MED ORDER — SODIUM CHLORIDE 0.9% FLUSH: 3.0000 mL | INTRAVENOUS | Status: DC | PRN

## 2019-09-29 MED ORDER — INSULIN ASPART 100 UNIT/ML ~~LOC~~ SOLN
0.0000 [IU] | Freq: Three times a day (TID) | SUBCUTANEOUS | Status: DC
  Administered 2019-09-29: 4 [IU] via SUBCUTANEOUS

## 2019-09-29 MED ORDER — SODIUM CHLORIDE 0.9% FLUSH
3.0000 mL | Freq: Two times a day (BID) | INTRAVENOUS | Status: DC
  Administered 2019-09-29 – 2019-09-30 (×3): 3 mL via INTRAVENOUS

## 2019-09-29 MED ORDER — SODIUM CHLORIDE 0.9 % IV SOLN: 250.0000 mL | INTRAVENOUS | Status: DC | PRN

## 2019-09-29 MED FILL — Lidocaine HCl Local Soln Prefilled Syringe 100 MG/5ML (2%): INTRAMUSCULAR | Qty: 5 | Status: AC

## 2019-09-29 MED FILL — Sodium Chloride IV Soln 0.9%: INTRAVENOUS | Qty: 2000 | Status: AC

## 2019-09-29 MED FILL — Potassium Chloride Inj 2 mEq/ML: INTRAVENOUS | Qty: 40 | Status: AC

## 2019-09-29 MED FILL — Mannitol IV Soln 20%: INTRAVENOUS | Qty: 500 | Status: AC

## 2019-09-29 MED FILL — Heparin Sodium (Porcine) Inj 1000 Unit/ML: INTRAMUSCULAR | Qty: 30 | Status: AC

## 2019-09-29 MED FILL — Sodium Bicarbonate IV Soln 8.4%: INTRAVENOUS | Qty: 50 | Status: AC

## 2019-09-29 MED FILL — Heparin Sodium (Porcine) Inj 1000 Unit/ML: INTRAMUSCULAR | Qty: 10 | Status: AC

## 2019-09-29 MED FILL — Electrolyte-R (PH 7.4) Solution: INTRAVENOUS | Qty: 3000 | Status: AC

## 2019-09-29 MED FILL — Magnesium Sulfate Inj 50%: INTRAMUSCULAR | Qty: 10 | Status: AC

## 2019-09-29 NOTE — Progress Notes (Signed)
Patient ID: Evan Rivera, male   DOB: 1960/06/24, 59 y.o.   MRN: [id number] TCTS Evening Rounds:  Hemodynamically stable in sinus rhythm.  Ambulating around the ICU.  Waiting on 4E bed.

## 2019-09-29 NOTE — Progress Notes (Signed)
° °  Primary team asked about cholesterol management. Statin's listed as an allergy with anaphylactic reaction. Question raised whether the medication in question was actually Niacin, not statins. Discussed with pharmacy who reviewed patient's chart. Patient apparently was on niacin and statin at the time of his anaphylactic reaction so unclear what the offending agent was. As such, will start zetia 10mg  daily for cholesterol management and arrange an outpatient appointment with the lipid clinic for PSK9-inhibitor consideration.   Abigail Butts, PA-C 09/29/19; 2:23 PM

## 2019-09-29 NOTE — Progress Notes (Addendum)
[address]       [place] [address]             [phone]      2 Days Post-Op Procedure(s) (LRB): CORONARY ARTERY BYPASS GRAFTING (CABG), ON PUMP, TIMES 4, USING LEFT INTERNAL MAMMARY ARTERY, LEFT RADIAL ARTERY, AND ENDOSCOPICALLY HARVESTED RIGHT GREATER SAPHENOUS VEIN. LIMA to LAD, LEFT RADIAL ARTERY to OM1, SVG to DIAG 1, SVG to PLB (N/A) RADIAL ARTERY HARVEST (Left) TRANSESOPHAGEAL ECHOCARDIOGRAM (TEE) (N/A) RESECTION OF INCIDENTAL ANTERIOR  MEDIASTINAL MASS (N/A) ENDOVEIN HARVEST OF GREATER SAPHENOUS VEIN (Right) Subjective: Feels good this morning. No issues overnight.   Objective: Vital signs in last 24 hours: Temp:  [97.8 F (36.6 C)-99.4 F (37.4 C)] 97.9 F (36.6 C) (07/29 0806) Pulse Rate:  [76-97] 89 (07/29 0600) Cardiac Rhythm: Normal sinus rhythm (07/28 2000) Resp:  [16-23] 16 (07/29 0600) BP: (101-121)/(60-102) 121/102 (07/29 0600) SpO2:  [94 %-100 %] 98 % (07/29 0600) Arterial Line BP: (109)/(62) 109/62 (07/28 0900) Weight:  [77.7 kg] 77.7 kg (07/29 0600)     Intake/Output from previous day: 07/28 0701 - 07/29 0700 In: 774.4 [I.V.:166.8; IV Piggyback:607.6] Out: 1655 [Urine:1455; Chest Tube:200] Intake/Output this shift: No intake/output data recorded.  General appearance: alert, cooperative and no distress Heart: regular rate and rhythm, S1, S2 normal, no murmur, click, rub or gallop Lungs: clear to auscultation bilaterally Abdomen: soft, non-tender; bowel sounds normal; no masses,  no organomegaly Extremities: extremities normal, atraumatic, no cyanosis or edema Wound: clean and dry dressed with a sterile dressing  Lab Results: Recent Labs    09/28/19 1631 09/29/19 0422  WBC 9.0 13.4*  HGB 7.3* 10.4*  HCT 22.5* 30.4*  PLT 142* 190   BMET:  Recent Labs    09/28/19 1631 09/29/19 0422  NA 139 131*  K 2.7* 4.5  CL 118* 99  CO2 17* 25  GLUCOSE 77 101*  BUN 7 9  CREATININE 0.38* 0.76  CALCIUM 5.2* 8.5*      PT/INR:  Recent Labs    09/27/19 1413  LABPROT 15.7*  INR 1.3*   ABG    Component Value Date/Time   PHART 7.360 09/27/2019 2003   HCO3 22.2 09/27/2019 2003   TCO2 23 09/27/2019 2003   ACIDBASEDEF 3.0 (H) 09/27/2019 2003   O2SAT 97.0 09/27/2019 2003   CBG (last 3)  Recent Labs    09/28/19 2345 09/29/19 0339 09/29/19 0653  GLUCAP 94 105* 138*    Assessment/Plan: S/P Procedure(s) (LRB): CORONARY ARTERY BYPASS GRAFTING (CABG), ON PUMP, TIMES 4, USING LEFT INTERNAL MAMMARY ARTERY, LEFT RADIAL ARTERY, AND ENDOSCOPICALLY HARVESTED RIGHT GREATER SAPHENOUS VEIN. LIMA to LAD, LEFT RADIAL ARTERY to OM1, SVG to DIAG 1, SVG to PLB (N/A) RADIAL ARTERY HARVEST (Left) TRANSESOPHAGEAL ECHOCARDIOGRAM (TEE) (N/A) RESECTION OF INCIDENTAL ANTERIOR  MEDIASTINAL MASS (N/A) ENDOVEIN HARVEST OF GREATER SAPHENOUS VEIN (Right)  1. CV-NSR in the 80s, BP well controlled. Continue asa, metoprolol, and no statin (due to allergy). Continue Imdur for radial artery harvest. 2. Pulm-tolerating 2L [place], weaning as able. Persistent right-sided pneumothorax. Chest tubes removed.  3. Renal-creatinine 0.76, electrolytes okay. Weight is up 3kg, no lasix started yet. 4. H and H 10.4/30.4, stable.  5. Endo-blood glucose well controlled.  6. Continue lovenox for DVT prophylaxis  Plan: Discontinue urinary foley catheter. Patient has already ambulated two laps this morning. Likely can be transferred to the step-down unit for continued care. Continue incentive spirometer hourly, weaning oxygen as able.  LOS: 3 days    Evan Rivera 09/29/2019  Feels well walked around unit this am Transfer to 4e Cardiology to see and recommen lipid therpy,- with question to allergy likely was reaction to nician I have seen and examined Evan Rivera and agree with the above assessment  and plan.  Grace Isaac MD Beeper [phone] Office [phone] 09/29/2019 10:32 AM

## 2019-09-29 NOTE — Progress Notes (Signed)
CARDIAC REHAB PHASE I   PRE:  Rate/Rhythm: 84 SR  BP:  Sitting: 112/67      SaO2: 96 3L  MODE:  Ambulation: 740 ft   POST:  Rate/Rhythm: 102 ST  BP:  Sitting: 113/77    SaO2: 99 3L  Pt ambulated 780ft in hallway standby assist with EVA. Pt denies SOB or dizziness, does c/o pain with inspiration. RN made aware. Pt returned to bed. Encouraged continued ambulation, IS use, and sitting in chair for meals. Pt able to maintain sternal precautions. Will continue to follow.  [id number] Rufina Falco, RN BSN 09/29/2019 2:26 PM

## 2019-09-30 ENCOUNTER — Inpatient Hospital Stay (HOSPITAL_COMMUNITY)

## 2019-09-30 LAB — TYPE AND SCREEN
ABO/RH(D): O POS
Antibody Screen: NEGATIVE
Unit division: 0
Unit division: 0
Unit division: 0
Unit division: 0

## 2019-09-30 LAB — GLUCOSE, CAPILLARY
Glucose-Capillary: 79 mg/dL (ref 70–99)
Glucose-Capillary: 74 mg/dL (ref 70–99)

## 2019-09-30 LAB — CBC
HCT: 31 % — ABNORMAL LOW (ref 39.0–52.0)
Hemoglobin: 10.2 g/dL — ABNORMAL LOW (ref 13.0–17.0)
MCH: 30.6 pg (ref 26.0–34.0)
MCHC: 32.9 g/dL (ref 30.0–36.0)
MCV: 93.1 fL (ref 80.0–100.0)
Platelets: 219 10*3/uL (ref 150–400)
RBC: 3.33 MIL/uL — ABNORMAL LOW (ref 4.22–5.81)
RDW: 13.1 % (ref 11.5–15.5)
WBC: 10.3 10*3/uL (ref 4.0–10.5)
nRBC: 0 % (ref 0.0–0.2)

## 2019-09-30 LAB — BPAM RBC
Blood Product Expiration Date: 202108272359
Blood Product Expiration Date: 202108272359
Blood Product Expiration Date: 202108282359
Blood Product Expiration Date: 202108282359
ISSUE DATE / TIME: 202107270801
ISSUE DATE / TIME: 202107270801
ISSUE DATE / TIME: 202107270945
ISSUE DATE / TIME: 202107270945
Unit Type and Rh: 5100
Unit Type and Rh: 5100
Unit Type and Rh: 5100
Unit Type and Rh: 5100

## 2019-09-30 LAB — BASIC METABOLIC PANEL
Anion gap: 9 (ref 5–15)
BUN: 11 mg/dL (ref 6–20)
CO2: 28 mmol/L (ref 22–32)
Calcium: 8.7 mg/dL — ABNORMAL LOW (ref 8.9–10.3)
Chloride: 101 mmol/L (ref 98–111)
Creatinine, Ser: 0.84 mg/dL (ref 0.61–1.24)
GFR calc Af Amer: >60 mL/min (ref >60)
GFR calc non Af Amer: >60 mL/min (ref >60)
Glucose, Bld: 112 mg/dL — ABNORMAL HIGH (ref 70–99)
Potassium: 5.2 mmol/L — ABNORMAL HIGH (ref 3.5–5.1)
Sodium: 138 mmol/L (ref 135–145)

## 2019-09-30 MED ORDER — ASPIRIN 81 MG PO CHEW
81.0000 mg | CHEWABLE_TABLET | Freq: Every day | ORAL | Status: DC
  Administered 2019-10-01: 81 mg via ORAL
  Filled 2019-09-30: qty 1

## 2019-09-30 MED ORDER — METOPROLOL TARTRATE 25 MG PO TABS
25.0000 mg | ORAL_TABLET | Freq: Two times a day (BID) | ORAL | Status: DC
  Administered 2019-09-30 – 2019-10-01 (×3): 25 mg via ORAL
  Filled 2019-09-30 (×3): qty 1

## 2019-09-30 MED ORDER — METOPROLOL TARTRATE 25 MG/10 ML ORAL SUSPENSION
12.5000 mg | Freq: Two times a day (BID) | ORAL | Status: DC
  Filled 2019-09-30 (×3): qty 5

## 2019-09-30 MED ORDER — CLOPIDOGREL BISULFATE 75 MG PO TABS
75.0000 mg | ORAL_TABLET | Freq: Every day | ORAL | Status: DC
  Administered 2019-10-01: 75 mg via ORAL
  Filled 2019-09-30: qty 1

## 2019-09-30 NOTE — Progress Notes (Signed)
EPWs pulled per order.  Sites unremarkable, all tips intact, Pt tolerated well.  Pt and wife understand bedrest for one hr with frequent VS.  CCMD notified, will monitor closely.

## 2019-09-30 NOTE — Op Note (Signed)
NAME: GLENNIE, RODDA MEDICAL RECORD [id number] ACCOUNT [id number] DATE OF BIRTH:12/02/1960 FACILITY: [facility] LOCATION: [facility] PHYSICIAN:Kato Wieczorek Maryruth Bun, MD  OPERATIVE REPORT  DATE OF PROCEDURE:  09/27/2019  PREOPERATIVE DIAGNOSIS:  24 hours following non-ST elevation myocardial infarction, incidental superior anterior mediastinal mass  POSTOPERATIVE DIAGNOSIS:  Same   SURGICAL PROCEDURE:  1/Coronary artery bypass grafting x4 with the left internal mammary to the left anterior descending coronary artery, reverse saphenous vein graft to the diagonal coronary artery, left radial artery to the circumflex coronary artery,  reverse saphenous vein graft to the posterolateral branch of the right coronary artery with left radial artery harvesting open and endoscopic vein harvesting, right greater saphenous thigh and calf.  2/ resection of incidental superior anterior mediastinal mass, 3.8 cm in size.  SURGEON:  Lanelle Bal, MD  FIRST ASSISTANT:  Enid Cutter, PA  BRIEF HISTORY:  The patient is a 59 year old male with no previous coronary artery history who presented the day prior to surgery with episode of prolonged chest pain.  He was stabilized medically and underwent cardiac catheterization the day before  surgery.  Troponins were elevated consistent with non-STEMI myocardial infarction.  The patient noted he had had chest pain to varying degrees of off for 4-5 years.  Two weeks prior to admission he was seen in the [facility] emergency room with accelerating  anginal symptoms, but was diagnosed with GI symptoms and referred for a GI appointment in late August.  He returned to the emergency room the day prior to surgery with recurrent prolonged symptoms and was admitted.  Cardiac catheterization was performed  by Dr. Martinique, which demonstrated total occlusion of the right coronary artery with faint collateral filling of distal vessels.  He had an 80-90% stenosis of the proximal LAD, 90%  stenosis of a moderate sized diagonal branch.  The circumflex was a  relatively large vessel with proximal 90% stenosis.  Ejection fraction was mildly depressed at 50% with some slight inferior hypokinesis.  Preoperative echocardiogram demonstrated trace to mild mitral regurgitation, otherwise normal appearing aortic and  mitral valve.  The patient was seen by cardiac surgery on review of his chest x-ray 2 weeks prior.  There was an anterior lung mass that was noted, but was never further characterized.  CT scan of the chest was done to evaluate this prior to surgery,  which demonstrated a new finding of a 3.8 cm superior anterior mediastinal mass present just superior to the innominate vein and extending down into the right peritracheal area.  With these findings, the coronary artery bypass grafting and resection of  the mediastinal mass was discussed with the patient.  Risks and options were reviewed and he was agreeable and signed informed consent.  DESCRIPTION OF PROCEDURE:  The patient underwent general endotracheal anesthesia without incident.  The skin of chest and legs was prepped with Betadine, draped in a sterile manner.  Left arm was also placed on an arm board and prepped with Betadine in a  circumferential manner and draped.  The left arm had preoperatively been marked.  Dopplers and physical exam confirmed intact palmar arch.  Appropriate timeout was performed and we first proceeded with harvesting the left radial artery with open  technique.  A curvilinear incision was made over the volar aspect of the left forearm.  Distally, the dissection was carried down and the radial artery was identified.  With occlusion of the radial artery, there was still retrograde flow through the  hand.  We then continued with the Harmonic  scalpel, dividing small side branches until the radial artery was freed.  The vessel was clamped proximally and distally ligated and divided.  The vessel was of good quality.   The proximal end was cannulated.   The forearm incision was then closed with running 3-0 Vicryl subcuticular stitch and 3-0 subcuticular stitch at the skin edges.  Dry dressings were applied and the left arm was tucked.  We then proceeded with the sternotomy.  The left chest was elevated  and the left internal mammary artery was dissected down as a pedicle graft.  The distal artery was divided, good free flow.  We then moved to the pericardium and from the CT scan, a somewhat soft, rubbery mass could be felt in the superior anterior  mediastinum space just superior to the innominate vein.  We extended the sternotomy incision slightly and dissected out the innominate vein and were able to easily identify the rubbery mass that was pushing on the anterior portion of the trachea and then  extended to the right and inferiorly.  The mass was well encapsulated.  Initially, it was unclear if this was thymoma or thyroid tissue, but it did have a component of cystic nature to it.  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  Mass itself was totally separate from the cervical fibroid.  We then opened the pericardium.  Before fully heparinizing the patient, we turned our attention to the right calf and upper thigh.  A  small incision was made at the knee and using endoscopic technique the right greater saphenous vein of the thigh and upper calf was harvested.  This was of adequate quality and caliber.  We then systemically heparinized the patient and ascending aorta  and the right atrium were cannulated.  Aortic root vent cardioplegia needle was introduced into the ascending aorta.  The patient was placed on cardiopulmonary bypass 2.4 liters per minute per meter square.  Sites of anastomosis were selected and  dissected out of the epicardium.  The patient's body temperature was cooled to 32 degrees.  Aortic crossclamp was applied and 800 mL of  cold blood potassium cardioplegia was administered with rapid diastolic arrest of the heart.  Myocardial septal  temperature was monitored throughout the crossclamp.  We first turned our attention to the distal right coronary artery and the posterior descending and posterolateral branches.  The distal right coronary artery was highly calcified as was the proximal  posterior descending.  After the takeoff of the posterior descending, the posterolateral branch of the coronary artery was relatively soft and had a lumen and we decided to perform an anastomosis at this point.  The vessel was opened and admitted a 1.5  mm probe proximally and distally.  Using a running 7-0 Prolene, distal anastomosis was performed with a segment of reverse saphenous vein graft.  We then elevated the heart and the circumflex branch was opened, admitted a 1.5 mm probe distally and  proximally.  Using a running 8-0 Prolene, the left radial artery as a free graft was anastomosed to the circumflex coronary artery.  We then turned our attention to the diagonal coronary artery, which was relatively small bifurcating vessel, but in the  midportion was large enough to open and admitted a 1 mm probe distally.  Using a running 7-0 Prolene, distal anastomosis was performed with a segment of reverse saphenous vein graft.  We then turned our attention to  the left anterior descending coronary  artery.  Between the mid and distal 1/3 the vessel the LAD was opened, admitted a 1.5 mm probe proximally and distally.  Using a running 8-0 Prolene, left internal mammary artery was anastomosed to left anterior descending coronary artery.  With  cross-clamp still in place, 2 punch aortotomies were performed.  Each of the 2 vein grafts were anastomosed to the ascending aorta.  The hood of the vein graft to the diagonal coronary artery was then opened and the proximal anastomosis to the radial  artery was performed piggybacked onto the takeoff of the vein  graft to the diagonal.  The heart was allowed to passively fill, the proximal anastomoses were completed, and the aortic crossclamp was removed with a total crossclamp time of 106 minutes.   The bulldog on the mammary artery was also removed with prompt rise in myocardial septal temperature.  Sites anastomosis were inspected and free of bleeding.  The patient spontaneously converted to a sinus rhythm.  With the patient's body temperature  rewarmed to 37 degrees he was then ventilated.  Atrial and ventricular pacing wires were applied.  Graft markers were applied.  He was then ventilated and weaned from cardiopulmonary bypass without difficulty.  He remained hemodynamically stable.  He was  decannulated in the usual fashion.  Protamine sulfate was administered.  With operative field hemostatic a left pleural tube and a Blake mediastinal drain were left in place.  The pericardium was loosely reapproximated.  Sternum was closed with 6  stainless steel wire.  The fascia was closed with interrupted 0 Vicryl, running 3-0 Vicryl subcutaneous tissue, 3-0 subcuticular stitch in skin edges.  Dry dressings were applied.  Sponge and needle count was reported as correct at completion of  procedure.  The patient tolerated the procedure without obvious complication and was transferred to the surgical intensive care unit for further postoperative care.  Total pump time was 137 minutes.  The patient did not require any blood bank blood  products during the operative procedure.  CN/NUANCE  D:09/29/2019 T:09/30/2019 JOB:[id number]

## 2019-09-30 NOTE — Progress Notes (Addendum)
[address]       [place] [address]             [phone]      3 Days Post-Op Procedure(s) (LRB): CORONARY ARTERY BYPASS GRAFTING (CABG), ON PUMP, TIMES 4, USING LEFT INTERNAL MAMMARY ARTERY, LEFT RADIAL ARTERY, AND ENDOSCOPICALLY HARVESTED RIGHT GREATER SAPHENOUS VEIN. LIMA to LAD, LEFT RADIAL ARTERY to OM1, SVG to DIAG 1, SVG to PLB (N/A) RADIAL ARTERY HARVEST (Left) TRANSESOPHAGEAL ECHOCARDIOGRAM (TEE) (N/A) RESECTION OF INCIDENTAL ANTERIOR  MEDIASTINAL MASS (N/A) ENDOVEIN HARVEST OF GREATER SAPHENOUS VEIN (Right) Subjective: Feels good this morning. Walking in the halls.   Objective: Vital signs in last 24 hours: Temp:  [97.8 F (36.6 C)-98.7 F (37.1 C)] 98.1 F (36.7 C) (07/30 0344) Pulse Rate:  [81-100] 90 (07/30 0344) Cardiac Rhythm: Normal sinus rhythm (07/29 1900) Resp:  [12-21] 19 (07/30 0344) BP: (95-123)/(61-84) 123/84 (07/30 0344) SpO2:  [89 %-99 %] 97 % (07/30 0344) Weight:  [76.2 kg] 76.2 kg (07/30 0344)     Intake/Output from previous day: 07/29 0701 - 07/30 0700 In: 940.7 [P.O.:820; IV Piggyback:120.7] Out: 62 [Urine:61; Stool:1] Intake/Output this shift: No intake/output data recorded.  General appearance: alert, cooperative and no distress Heart: regular rate and rhythm, S1, S2 normal, no murmur, click, rub or gallop Lungs: clear to auscultation bilaterally Abdomen: soft, non-tender; bowel sounds normal; no masses,  no organomegaly Extremities: extremities normal, atraumatic, no cyanosis or edema Wound: clean and dry radial incision. Sternal incisions still covered with a sterile dressing  Lab Results: Recent Labs    09/29/19 0422 09/30/19 0431  WBC 13.4* 10.3  HGB 10.4* 10.2*  HCT 30.4* 31.0*  PLT 190 219   BMET:  Recent Labs    09/29/19 0422 09/30/19 0431  NA 131* 138  K 4.5 5.2*  CL 99 101  CO2 25 28  GLUCOSE 101* 112*  BUN 9 11  CREATININE 0.76 0.84  CALCIUM 8.5* 8.7*    PT/INR:  Recent Labs     09/27/19 1413  LABPROT 15.7*  INR 1.3*   ABG    Component Value Date/Time   PHART 7.360 09/27/2019 2003   HCO3 22.2 09/27/2019 2003   TCO2 23 09/27/2019 2003   ACIDBASEDEF 3.0 (H) 09/27/2019 2003   O2SAT 97.0 09/27/2019 2003   CBG (last 3)  Recent Labs    09/29/19 1537 09/29/19 2103 09/30/19 0600  GLUCAP 168* 70 79    Assessment/Plan: S/P Procedure(s) (LRB): CORONARY ARTERY BYPASS GRAFTING (CABG), ON PUMP, TIMES 4, USING LEFT INTERNAL MAMMARY ARTERY, LEFT RADIAL ARTERY, AND ENDOSCOPICALLY HARVESTED RIGHT GREATER SAPHENOUS VEIN. LIMA to LAD, LEFT RADIAL ARTERY to OM1, SVG to DIAG 1, SVG to PLB (N/A) RADIAL ARTERY HARVEST (Left) TRANSESOPHAGEAL ECHOCARDIOGRAM (TEE) (N/A) RESECTION OF INCIDENTAL ANTERIOR  MEDIASTINAL MASS (N/A) ENDOVEIN HARVEST OF GREATER SAPHENOUS VEIN (Right)  1. CV-NSR in the low 100s, BP well controlled. Continue asa, metoprolol, and no statin (due to allergy). Continue Imdur for radial artery harvest. Cardiology started Zetia. 2. Pulm-tolerating 2L [place], weaning as able. Persistent right-sided pneumothorax. Persistent bibasilar atelectasis  3. Renal-creatinine 0.84, electrolytes okay. Weight is up 2kg, no lasix started yet. 4. H and H 10.2/31.0, stable.  5. Endo-blood glucose well controlled.  6. Continue lovenox for DVT prophylaxis  Plan: Start ASA 81mg  and Plavix 75mg  daily for NSTEMI. Increase metoprolol for better HR control. Started on Zetia by Cardiology yesterday. Discontinue EPW since rhythm has been stable. Possibly home this weekend.  LOS: 4 days    Evan Rivera 09/30/2019  Home on plavix  For non stemi Home on imdur x 30 days for radial graft Follow up chest xray in am- if stable poss home sat or Sunday I have seen and examined Evan Rivera and agree with the above assessment  and plan.  Grace Isaac MD Beeper [phone] Office [phone] 09/30/2019 2:05 PM

## 2019-09-30 NOTE — Progress Notes (Signed)
CARDIAC REHAB PHASE I   Pt seen walking independently in hallway with steady gait. Pt HR noted to be 107 ST. Pt denies pain, SOB, or dizziness. Will f/u and encourage ambulation.  Rufina Falco, RN BSN 09/30/2019 9:50 AM

## 2019-09-30 NOTE — Progress Notes (Signed)
Mobility Specialist - Progress Note   09/30/19 1344  Mobility  Activity  (Cancel)  Mobility performed by Mobility specialist     RN instructed not to see as he has ambulated independently several times today.   Pricilla Handler Mobility Specialist Mobility Specialist Phone: [phone]

## 2019-10-01 ENCOUNTER — Inpatient Hospital Stay (HOSPITAL_COMMUNITY)

## 2019-10-01 LAB — GLUCOSE, CAPILLARY: Glucose-Capillary: 95 mg/dL (ref 70–99)

## 2019-10-01 MED ORDER — ISOSORBIDE MONONITRATE ER 30 MG PO TB24: 30.0000 mg | ORAL_TABLET | Freq: Every day | ORAL | 0 refills | Status: DC

## 2019-10-01 MED ORDER — EZETIMIBE 10 MG PO TABS: 10.0000 mg | ORAL_TABLET | Freq: Every day | ORAL | 3 refills | Status: DC

## 2019-10-01 MED ORDER — METOPROLOL TARTRATE 25 MG PO TABS: 25.0000 mg | ORAL_TABLET | Freq: Two times a day (BID) | ORAL | 3 refills | Status: DC

## 2019-10-01 MED ORDER — CLOPIDOGREL BISULFATE 75 MG PO TABS: 75.0000 mg | ORAL_TABLET | Freq: Every day | ORAL | 3 refills | Status: DC

## 2019-10-01 MED ORDER — OXYCODONE HCL 5 MG PO TABS: 5.0000 mg | ORAL_TABLET | ORAL | 0 refills | Status: DC | PRN

## 2019-10-01 MED ORDER — ACETAMINOPHEN 500 MG PO TABS: 1000.0000 mg | ORAL_TABLET | Freq: Four times a day (QID) | ORAL | 0 refills | Status: AC | PRN

## 2019-10-01 MED ORDER — ASPIRIN 81 MG PO CHEW: 81.0000 mg | CHEWABLE_TABLET | Freq: Every day | ORAL | Status: AC

## 2019-10-01 NOTE — Progress Notes (Signed)
      [address]       [place] [address]             [phone]      4 Days Post-Op Procedure(s) (LRB): CORONARY ARTERY BYPASS GRAFTING (CABG), ON PUMP, TIMES 4, USING LEFT INTERNAL MAMMARY ARTERY, LEFT RADIAL ARTERY, AND ENDOSCOPICALLY HARVESTED RIGHT GREATER SAPHENOUS VEIN. LIMA to LAD, LEFT RADIAL ARTERY to OM1, SVG to DIAG 1, SVG to PLB (N/A) RADIAL ARTERY HARVEST (Left) TRANSESOPHAGEAL ECHOCARDIOGRAM (TEE) (N/A) RESECTION OF INCIDENTAL ANTERIOR  MEDIASTINAL MASS (N/A) ENDOVEIN HARVEST OF GREATER SAPHENOUS VEIN (Right)   Subjective:  No new complaints.  Feels ready to go home.  + ambulation  + BM  Objective: Vital signs in last 24 hours: Temp:  [97.6 F (36.4 C)-99.1 F (37.3 C)] 99.1 F (37.3 C) (07/31 0416) Pulse Rate:  [85-99] 99 (07/31 0416) Cardiac Rhythm: Normal sinus rhythm (07/31 0700) Resp:  [18-23] 20 (07/31 0416) BP: (105-122)/(68-94) 122/94 (07/31 0416) SpO2:  [86 %-96 %] 95 % (07/31 0416) Weight:  [73.8 kg] 73.8 kg (07/31 0625)  General appearance: alert, cooperative and no distress Heart: regular rate and rhythm Lungs: clear to auscultation bilaterally Abdomen: soft, non-tender; bowel sounds normal; no masses,  no organomegaly Extremities: edema trace Wound: clean and dry  Lab Results: Recent Labs    09/29/19 0422 09/30/19 0431  WBC 13.4* 10.3  HGB 10.4* 10.2*  HCT 30.4* 31.0*  PLT 190 219   BMET:  Recent Labs    09/29/19 0422 09/30/19 0431  NA 131* 138  K 4.5 5.2*  CL 99 101  CO2 25 28  GLUCOSE 101* 112*  BUN 9 11  CREATININE 0.76 0.84  CALCIUM 8.5* 8.7*    PT/INR: No results for input(s): LABPROT, INR in the last 72 hours. ABG    Component Value Date/Time   PHART 7.360 09/27/2019 2003   HCO3 22.2 09/27/2019 2003   TCO2 23 09/27/2019 2003   ACIDBASEDEF 3.0 (H) 09/27/2019 2003   O2SAT 97.0 09/27/2019 2003   CBG (last 3)  Recent Labs    09/30/19 0600 09/30/19 2128 10/01/19 0616  GLUCAP 79 74 95     Assessment/Plan: S/P Procedure(s) (LRB): CORONARY ARTERY BYPASS GRAFTING (CABG), ON PUMP, TIMES 4, USING LEFT INTERNAL MAMMARY ARTERY, LEFT RADIAL ARTERY, AND ENDOSCOPICALLY HARVESTED RIGHT GREATER SAPHENOUS VEIN. LIMA to LAD, LEFT RADIAL ARTERY to OM1, SVG to DIAG 1, SVG to PLB (N/A) RADIAL ARTERY HARVEST (Left) TRANSESOPHAGEAL ECHOCARDIOGRAM (TEE) (N/A) RESECTION OF INCIDENTAL ANTERIOR  MEDIASTINAL MASS (N/A) ENDOVEIN HARVEST OF GREATER SAPHENOUS VEIN (Right)  1. CV- NSR, BP stable- continue Lopressor, Imdur for radial artery graft, Plavix 2. Pulm- improvement in Right apical pneumothorax, continue IS 3. Renal- creatinine WNL, weight is below baseline, no lasix needed at this time 4. Dispo- patient stable, will d/c home today   LOS: 5 days    Ellwood Handler, PA-C  10/01/2019

## 2019-10-01 NOTE — Plan of Care (Signed)
Problem: Education: Goal: Knowledge of General Education information will improve Description: Including pain rating scale, medication(s)/side effects and non-pharmacologic comfort measures Outcome: Adequate for Discharge   Problem: Health Behavior/Discharge Planning: Goal: Ability to manage health-related needs will improve Outcome: Adequate for Discharge   Problem: Clinical Measurements: Goal: Ability to maintain clinical measurements within normal limits will improve Outcome: Adequate for Discharge Goal: Will remain free from infection Outcome: Adequate for Discharge Goal: Diagnostic test results will improve Outcome: Adequate for Discharge Goal: Respiratory complications will improve Outcome: Adequate for Discharge Goal: Cardiovascular complication will be avoided Outcome: Adequate for Discharge   Problem: Activity: Goal: Risk for activity intolerance will decrease Outcome: Adequate for Discharge   Problem: Nutrition: Goal: Adequate nutrition will be maintained Outcome: Adequate for Discharge   Problem: Coping: Goal: Level of anxiety will decrease Outcome: Adequate for Discharge   Problem: Elimination: Goal: Will not experience complications related to bowel motility Outcome: Adequate for Discharge Goal: Will not experience complications related to urinary retention Outcome: Adequate for Discharge   Problem: Pain Managment: Goal: General experience of comfort will improve Outcome: Adequate for Discharge   Problem: Safety: Goal: Ability to remain free from injury will improve Outcome: Adequate for Discharge   Problem: Skin Integrity: Goal: Risk for impaired skin integrity will decrease Outcome: Adequate for Discharge   Problem: Education: Goal: Knowledge of General Education information will improve Description: Including pain rating scale, medication(s)/side effects and non-pharmacologic comfort measures Outcome: Adequate for Discharge   Problem: Health  Behavior/Discharge Planning: Goal: Ability to manage health-related needs will improve Outcome: Adequate for Discharge   Problem: Clinical Measurements: Goal: Ability to maintain clinical measurements within normal limits will improve Outcome: Adequate for Discharge Goal: Will remain free from infection Outcome: Adequate for Discharge Goal: Diagnostic test results will improve Outcome: Adequate for Discharge Goal: Respiratory complications will improve Outcome: Adequate for Discharge Goal: Cardiovascular complication will be avoided Outcome: Adequate for Discharge   Problem: Activity: Goal: Risk for activity intolerance will decrease Outcome: Adequate for Discharge   Problem: Nutrition: Goal: Adequate nutrition will be maintained Outcome: Adequate for Discharge   Problem: Coping: Goal: Level of anxiety will decrease Outcome: Adequate for Discharge   Problem: Elimination: Goal: Will not experience complications related to bowel motility Outcome: Adequate for Discharge Goal: Will not experience complications related to urinary retention Outcome: Adequate for Discharge   Problem: Pain Managment: Goal: General experience of comfort will improve Outcome: Adequate for Discharge   Problem: Safety: Goal: Ability to remain free from injury will improve Outcome: Adequate for Discharge   Problem: Skin Integrity: Goal: Risk for impaired skin integrity will decrease Outcome: Adequate for Discharge   Problem: Education: Goal: Will demonstrate proper wound care and an understanding of methods to prevent future damage Outcome: Adequate for Discharge Goal: Knowledge of disease or condition will improve Outcome: Adequate for Discharge Goal: Knowledge of the prescribed therapeutic regimen will improve Outcome: Adequate for Discharge Goal: Individualized Educational Video(s) Outcome: Adequate for Discharge   Problem: Activity: Goal: Risk for activity intolerance will  decrease Outcome: Adequate for Discharge   Problem: Cardiac: Goal: Will achieve and/or maintain hemodynamic stability Outcome: Adequate for Discharge   Problem: Clinical Measurements: Goal: Postoperative complications will be avoided or minimized Outcome: Adequate for Discharge   Problem: Respiratory: Goal: Respiratory status will improve Outcome: Adequate for Discharge   Problem: Skin Integrity: Goal: Wound healing without signs and symptoms of infection Outcome: Adequate for Discharge Goal: Risk for impaired skin integrity will decrease Outcome: Adequate for Discharge  Problem: Urinary Elimination: Goal: Ability to achieve and maintain adequate renal perfusion and functioning will improve Outcome: Adequate for Discharge

## 2019-10-01 NOTE — Progress Notes (Signed)
CARDIAC REHAB PHASE I   D/c education completed with pt and wife. Pt educated on importance of site care and monitoring incision daily. Encouraged continued IS use, walks, and sternal precautions. Pt given in-the-tube sheet along with heart healthy diet. Reviewed restrictions and exercise guidelines. Will refer to CRP II [place]. Pt is interested in participating in Virtual Cardiac and Pulmonary Rehab. Pt advised that Virtual Cardiac and Pulmonary Rehab is provided at no cost to the patient.  Checklist:  1. Pt has smart device  ie smartphone and/or ipad for downloading an app  Yes 2. Reliable internet/wifi service    Yes 3. Understands how to use their smartphone and navigate within an app.  Yes  Pt verbalized understanding and is in agreement.  [phone] Rufina Falco, RN BSN 10/01/2019 8:31 AM

## 2019-10-02 ENCOUNTER — Other Ambulatory Visit: Payer: Self-pay | Admitting: Cardiothoracic Surgery

## 2019-10-02 MED ORDER — PROMETHAZINE HCL 25 MG RE SUPP: 25.0000 mg | Freq: Four times a day (QID) | RECTAL | 1 refills | Status: DC | PRN

## 2019-10-02 NOTE — Progress Notes (Unsigned)
Prescription entered for phenergan suppository. Evan Rivera Z. Orvan Seen, [place]

## 2019-10-09 NOTE — Progress Notes (Signed)
Patient ID: Evan Rivera                 DOB: April 05, 1960                    MRN: [id number]     HPI: Evan Rivera is a 59 y.o. male who presents to lipid clinic for hospital follow-up. PMH is significant for HLD, HTN, and prior tobacco use. Pt was recently admitted for acute NSTEMI. 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 Patient was started on ezetimibe at hospital discharge.  Patient reports that he had an allergic reaction to a cholesterol medication in the past that caused flushing, burning sensation, and low BP and was admitted to the hospital. Pt originally thinks this was due to a statin. Medical record shows a hospital admission in January 2012 for an allergic reaction to Niaspan as well. He is currently taking ezetimibe 10mg  daily and is tolerating medication well. He has been working on his diet since his hospital admission and no longer eats fast food. He does enjoy eating potato chips. 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  Current Medications: ezetimibe 10mg  daily Intolerances: Niaspan (flushing, burning sensation, low BP), statin - anaphylaxis? Risk Factors: ASCVD, former smoker, family history LDL goal: <55 mg/dL  Diet: No longer eats fast food ([employer]). For breakfast, he ate cheese toast. He ate oyster stew for lunch and salmon patties for dinner. Enjoys potato chips and chips/salsa for snacks. Drinks diet soda and decaf coffee.  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 Allergy    seasonal  . GERD (gastroesophageal reflux disease)    . Hyperlipidemia    no meds   . Hypertension     Current Outpatient Medications on File Prior to Visit  Medication Sig Dispense Refill  . acetaminophen (TYLENOL) 500 MG tablet Take 2 tablets (1,000 mg total) by mouth every 6 (six) hours as needed for mild pain, fever or headache. 30 tablet 0  . Ascorbic Acid (VITAMIN C) 1000 MG tablet Take 2,000 mg by mouth at bedtime.    Marland Kitchen aspirin 81 MG chewable tablet Chew 1 tablet (81 mg total) by mouth daily.    . cholecalciferol (VITAMIN D) 25 MCG (1000 UNIT) tablet Take 2,000 Units by mouth at bedtime.    . clopidogrel (PLAVIX) 75 MG tablet Take 1 tablet (75 mg total) by mouth daily. 30 tablet 3  . ezetimibe (ZETIA) 10 MG tablet Take 1 tablet (10 mg total) by mouth at bedtime. 30 tablet 3  . isosorbide mononitrate (IMDUR) 30 MG 24 hr tablet Take 1 tablet (30 mg total) by mouth daily. 30 tablet 0  . metoprolol tartrate (LOPRESSOR) 25 MG tablet Take 1 tablet (25 mg total) by mouth 2 (two) times daily. 60 tablet 3  . Multiple Vitamins-Minerals (ZINC PO) Take 1 tablet by mouth at bedtime.    Marland Kitchen oxyCODONE (OXY IR/ROXICODONE) 5 MG immediate release tablet Take 1 tablet (5 mg total) by mouth every 4 (four) hours as needed for severe pain. 30 tablet 0  .  promethazine (PHENERGAN) 25 MG suppository Place 1 suppository (25 mg total) rectally every 6 (six) hours as needed for nausea. 12 suppository 1   No current facility-administered medications on file prior to visit.    Allergies  Allergen Reactions  . Statins Anaphylaxis    Pt does not remember which statin caused the reaction  . Drug [Tape] Hives    Reaction to some forms of bandaids - please use paper tape    Assessment/Plan:  1. Hyperlipidemia - LDL is elevated and above goal of <55 mg/dL. Will continue ezetimibe 10mg  daily and start Praluent 75 mg injections every 2 weeks. Patient aware of $33/month copay. Discussed starting Vascepa 2g BID for added CV benefit and TG-lowering effect, but patient  would like to start Praluent first and check lipid panel before making additional changes. Unclear if statin was cause of his anaphylactic reaction. Patient will continue to work on eating a heart-healthy diet and maintaining a regular exercise regimen. Follow-up fasting labs 4-6 weeks after starting Praluent. Will submit PA and call patient to schedule follow-up fasting labs once PA is approved.   Richardine Service, PharmD PGY2 Cardiology Pharmacy Resident  Addendum: [employer] insurance prefers Repatha over [place]. Prior authorization has been submitted.

## 2019-10-10 ENCOUNTER — Ambulatory Visit (INDEPENDENT_AMBULATORY_CARE_PROVIDER_SITE_OTHER): Admitting: Pharmacist

## 2019-10-10 ENCOUNTER — Other Ambulatory Visit: Payer: Self-pay

## 2019-10-10 DIAGNOSIS — E785 Hyperlipidemia, unspecified: Secondary | ICD-10-CM | POA: Diagnosis not present

## 2019-10-10 NOTE — Patient Instructions (Addendum)
It was nice to meet you today!   Your LDL is 118 and your goal is < 55   I will submit information to your insurance to see if they will cover Praluent injections. This medication is stored in the fridge, is given every 2 weeks into the fatty tissue of your stomach, and lowers your LDL cholesterol by 60%  We will give you a call once the medication is ready for you at the pharmacy.  We will plan to recheck your cholesterol 6-8 weeks after starting the injections

## 2019-10-11 ENCOUNTER — Telehealth (HOSPITAL_COMMUNITY): Payer: Self-pay

## 2019-10-11 ENCOUNTER — Telehealth: Payer: Self-pay | Admitting: Pharmacist

## 2019-10-11 DIAGNOSIS — E785 Hyperlipidemia, unspecified: Secondary | ICD-10-CM

## 2019-10-11 NOTE — Telephone Encounter (Signed)
Attempted to call patient in regards to Cardiac Rehab - LM on VM 

## 2019-10-11 NOTE — Telephone Encounter (Signed)
Called patient and left voicemail. Prior authorization for Repatha 140 mg injection every 14 days has been approved by insurance. Repatha is preferred over Praluent by patient's insurance. Will send prescription to patient's preferred pharmacy ([facility] on [address]) after discussing with patient. Copay should be $33/month.

## 2019-10-12 MED ORDER — REPATHA SURECLICK 140 MG/ML ~~LOC~~ SOAJ: 140.0000 mg | SUBCUTANEOUS | 11 refills | Status: DC

## 2019-10-12 NOTE — Telephone Encounter (Signed)
Called patient and discussed information below. Will send in Rx for Repatha to patient's preferred pharmacy. Follow-up fasting labs scheduled for 11/17/19.

## 2019-10-12 NOTE — Telephone Encounter (Signed)
Prior authorization approved for Repatha through 10/09/20.

## 2019-10-13 ENCOUNTER — Ambulatory Visit: Admitting: Nurse Practitioner

## 2019-10-16 NOTE — Progress Notes (Signed)
Cardiology Office Note    Date:  10/17/2019   ID:  WILMA WUTHRICH, DOB 1960/04/02, MRN [id number]  PCP:  Pleas Koch, NP  Cardiologist: Dr. Radford Pax  Chief Complaint: Hospital follow up  History of Present Illness:   Evan Rivera is a 59 y.o. male with hx of HTN and HLD  presents for hospital follow up.   Most recently presented to hospital for chest pain and ruled in for NSTEMI. Cath showed multivessel CAD and underwent CABG x 4. He was started on Imdur for his radial artery harvest.  hospital course complicated by R sided pneumothorax with improvement on repeat chest x-ray.   Here today for follow up.  He is walking about half a mile every day without chest pain or shortness of breath.  Complains of numbness and tingling in the bottom of his feet.  No history of diabetes.  Compliant with medication.  Denies chest tightness, shortness of breath, orthopnea, PND, syncope, lower extremity edema or melena.  Past Medical History:  Diagnosis Date  . Allergy    seasonal  . CAD (coronary artery disease) of bypass graft    s/p CABG x 4  . GERD (gastroesophageal reflux disease)   . Hyperlipidemia    no meds   . Hypertension     Past Surgical History:  Procedure Laterality Date  . COLONOSCOPY     > 10 yeras ago- normal per pt.   . CORONARY ARTERY BYPASS GRAFT N/A 09/27/2019   Procedure: CORONARY ARTERY BYPASS GRAFTING (CABG), ON PUMP, TIMES 4, USING LEFT INTERNAL MAMMARY ARTERY, LEFT RADIAL ARTERY, AND ENDOSCOPICALLY HARVESTED RIGHT GREATER SAPHENOUS VEIN. LIMA to LAD, LEFT RADIAL ARTERY to OM1, SVG to DIAG 1, SVG to PLB;  Surgeon: Grace Isaac, MD;  Location: [place];  Service: Open Heart Surgery;  Laterality: N/A;  . ENDOVEIN HARVEST OF GREATER SAPHENOUS VEIN Right 09/27/2019   Procedure: ENDOVEIN HARVEST OF GREATER SAPHENOUS VEIN;  Surgeon: Grace Isaac, MD;  Location: [place];  Service: Open Heart Surgery;  Laterality: Right;  . HERNIA REPAIR     umbilical   . LEFT  HEART CATH AND CORONARY ANGIOGRAPHY N/A 09/26/2019   Procedure: LEFT HEART CATH AND CORONARY ANGIOGRAPHY;  Surgeon: Martinique, Peter M, MD;  Location: [place] [facility];  Service: Cardiovascular;  Laterality: N/A;  . RADIAL ARTERY HARVEST Left 09/27/2019   Procedure: RADIAL ARTERY HARVEST;  Surgeon: Grace Isaac, MD;  Location: [place];  Service: Open Heart Surgery;  Laterality: Left;  . RESECTION OF MEDIASTINAL MASS N/A 09/27/2019   Procedure: RESECTION OF INCIDENTAL ANTERIOR  MEDIASTINAL MASS;  Surgeon: Grace Isaac, MD;  Location: [place];  Service: Open Heart Surgery;  Laterality: N/A;  . TEE WITHOUT CARDIOVERSION N/A 09/27/2019   Procedure: TRANSESOPHAGEAL ECHOCARDIOGRAM (TEE);  Surgeon: Grace Isaac, MD;  Location: [place];  Service: Open Heart Surgery;  Laterality: N/A;  . TONSILLECTOMY      Current Medications: Prior to Admission medications   Medication Sig Start Date End Date Taking? Authorizing Provider  acetaminophen (TYLENOL) 500 MG tablet Take 2 tablets (1,000 mg total) by mouth every 6 (six) hours as needed for mild pain, fever or headache. 10/01/19   Barrett, Lodema Hong, PA-C  Ascorbic Acid (VITAMIN C) 1000 MG tablet Take 2,000 mg by mouth at bedtime.    [provider]  aspirin 81 MG chewable tablet Chew 1 tablet (81 mg total) by mouth daily. 10/01/19   Barrett, Lodema Hong, PA-C  cholecalciferol (VITAMIN D) 25 MCG (1000 UNIT) tablet Take 2,000 Units by mouth at bedtime.    [provider]  clopidogrel (PLAVIX) 75 MG tablet Take 1 tablet (75 mg total) by mouth daily. 10/01/19   Barrett, Erin R, PA-C  Evolocumab (REPATHA SURECLICK) 409 MG/ML SOAJ Inject 140 mg into the skin every 14 (fourteen) days. 10/12/19   Leanor Kail, PA  ezetimibe (ZETIA) 10 MG tablet Take 1 tablet (10 mg total) by mouth at bedtime. 10/01/19   Barrett, Erin R, PA-C  isosorbide mononitrate (IMDUR) 30 MG 24 hr tablet Take 1 tablet (30 mg total) by mouth daily. 10/01/19   Barrett, Erin R, PA-C   metoprolol tartrate (LOPRESSOR) 25 MG tablet Take 1 tablet (25 mg total) by mouth 2 (two) times daily. 10/01/19   Barrett, Erin R, PA-C  Multiple Vitamins-Minerals (ZINC PO) Take 1 tablet by mouth at bedtime.    [provider]  oxyCODONE (OXY IR/ROXICODONE) 5 MG immediate release tablet Take 1 tablet (5 mg total) by mouth every 4 (four) hours as needed for severe pain. 10/01/19   Barrett, Lodema Hong, PA-C  promethazine (PHENERGAN) 25 MG suppository Place 1 suppository (25 mg total) rectally every 6 (six) hours as needed for nausea. 10/02/19 10/01/20  Wonda Olds, MD    Allergies:   Statins and Drug [tape]   Social History   Socioeconomic History  . Marital status: Married    Spouse name: Not on file  . Number of children: Not on file  . Years of education: Not on file  . Highest education level: Not on file  Occupational History  . Not on file  Tobacco Use  . Smoking status: Former Smoker    Types: Cigarettes    Quit date: 06/23/1989    Years since quitting: 30.3  . Smokeless tobacco: Former Network engineer and Sexual Activity  . Alcohol use: No  . Drug use: No  . Sexual activity: Not on file  Other Topics Concern  . Not on file  Social History Narrative   Married.   2 children.   Works as a Administrator.   Enjoys kayaking.    [employer] years.     Social Determinants of Health   Financial Resource Strain:   . Difficulty of Paying Living Expenses:   Food Insecurity:   . Worried About Charity fundraiser in the Last Year:   . Arboriculturist in the Last Year:   Transportation Needs:   . Film/video editor (Medical):   Marland Kitchen Lack of Transportation (Non-Medical):   Physical Activity:   . Days of Exercise per Week:   . Minutes of Exercise per Session:   Stress:   . Feeling of Stress :   Social Connections:   . Frequency of Communication with Friends and Family:   . Frequency of Social Gatherings with Friends and Family:   . Attends Religious Services:   .  Active Member of Clubs or Organizations:   . Attends Archivist Meetings:   Marland Kitchen Marital Status:      Family History:  The patient's family history includes Heart disease in his mother; Hyperlipidemia in his father; Hypertension in his father; Prostate cancer in his father.   ROS:   Please see the history of present illness.    ROS All other systems reviewed and are negative.   PHYSICAL EXAM:   VS:  BP 100/70   Pulse 75   Ht 5\' 6"  (  1.676 m)   Wt 156 lb (70.8 kg)   SpO2 98%   BMI 25.18 kg/m    GEN: Well nourished, well developed, in no acute distress  HEENT: normal  Neck: no JVD, carotid bruits, or masses Cardiac: RRR; no murmurs, rubs, or gallops,no edema  Respiratory:  clear to auscultation bilaterally, normal work of breathing GI: soft, nontender, nondistended, + BS MS: no deformity or atrophy  Skin: warm and dry, no rash Neuro:  Alert and Oriented x 3, Strength and sensation are intact Psych: euthymic mood, full affect  Wt Readings from Last 3 Encounters:  10/17/19 156 lb (70.8 kg)  10/01/19 162 lb 9.6 oz (73.8 kg)  09/13/19 165 lb 12 oz (75.2 kg)      Studies/Labs Reviewed:   EKG:  EKG is ordered today.  The ekg ordered today demonstrates normal sinus rhythm rate of 74 bpm  Recent Labs: 09/26/2019: ALT 28; TSH 1.054 09/28/2019: Magnesium 1.2 09/30/2019: BUN 11; Creatinine, Ser 0.84; Hemoglobin 10.2; Platelets 219; Potassium 5.2; Sodium 138   Lipid Panel    Component Value Date/Time   CHOL 191 09/26/2019 0655   TRIG 164 (H) 09/26/2019 0655   HDL 40 (L) 09/26/2019 0655   CHOLHDL 4.8 09/26/2019 0655   VLDL 33 09/26/2019 0655   LDLCALC 118 (H) 09/26/2019 0655   LDLDIRECT 97.0 09/13/2019 1526    Additional studies/ records that were reviewed today include:   CABG 09/27/19 LIMA->LAD SVG->D1 L. Radial Artery->OM SVG->PLB of RCA  Echocardiogram: 09/26/19 1. Left ventricular ejection fraction, by estimation, is 55 to 60%. The  left ventricle has  normal function. The left ventricle has no regional  wall motion abnormalities. Left ventricular diastolic parameters were  normal.  2. Right ventricular systolic function is normal. The right ventricular  size is normal. Tricuspid regurgitation signal is inadequate for assessing  PA pressure.  3. The mitral valve is grossly normal. Trivial mitral valve  regurgitation. No evidence of mitral stenosis.  4. The aortic valve is tricuspid. Aortic valve regurgitation is trivial.  No aortic stenosis is present.  5. The inferior vena cava is dilated in size with <50% respiratory  variability, suggesting right atrial pressure of 15 mmHg.   Cardiac Catheterization: 09/26/19 LEFT HEART CATH AND CORONARY ANGIOGRAPHY  Conclusion    Prox LAD lesion is 70% stenosed.  Mid LAD lesion is 80% stenosed.  2nd Diag lesion is 90% stenosed.  Ost Cx to Prox Cx lesion is 90% stenosed.  1st Mrg lesion is 70% stenosed.  Prox RCA lesion is 85% stenosed.  Prox RCA to Mid RCA lesion is 100% stenosed.  There is mild left ventricular systolic dysfunction.  LV end diastolic pressure is mildly elevated.  The left ventricular ejection fraction is 50-55% by visual estimate.   1. Severe 3 vessel obstructive CAD 2. Mild LV impairment. EF 50% with global HK. 3. Mildly elevated LVEDP  Plan: CT surgery consultation for CABG.  ASSESSMENT & PLAN:   1. CAD s/p CABG x 4 - on Imdur for his radial artery harvest.  - Continue ASA, Plavix and BB.  -No angina.  Continue current medication.  2. HTN -Blood pressure well controlled on beta-blocker  3. HLD - 09/26/2019: Cholesterol 191; HDL 40; LDL Cholesterol 118; Triglycerides 164; VLDL 33  - Hx of statin intolerance - On Repatha  4. Post op pneumothorax -Improved on repeat x-ray prior to discharge.  Has follow-up with surgeon in a few days with repeat study.   Medication Adjustments/Labs and Tests  Ordered: Current medicines are reviewed at length  with the patient today.  Concerns regarding medicines are outlined above.  Medication changes, Labs and Tests ordered today are listed in the Patient Instructions below. Patient Instructions  Medication Instructions:  Your physician recommends that you continue on your current medications as directed.  We have sent in a prescription for Nitroglycerin 0.4 s/l tablet to your pharmacy for you to pick up and use ONLY AS NEEDED  *If you need a refill on your cardiac medications before your next appointment, please call your pharmacy*   Lab Work: None ordered today  If you have labs (blood work) drawn today and your tests are completely normal, you will receive your results only by: Marland Kitchen MyChart Message (if you have MyChart) OR . A paper copy in the mail If you have any lab test that is abnormal or we need to change your treatment, we will call you to review the results.   Testing/Procedures: None ordered   Follow-Up: At [facility], you and your health needs are our priority.  As part of our continuing mission to provide you with exceptional heart care, we have created designated Provider Care Teams.  These Care Teams include your primary Cardiologist (physician) and Advanced Practice Providers (APPs -  Physician Assistants and Nurse Practitioners) who all work together to provide you with the care you need, when you need it.  We recommend signing up for the patient portal called "MyChart".  Sign up information is provided on this After Visit Summary.  MyChart is used to connect with patients for Virtual Visits (Telemedicine).  Patients are able to view lab/test results, encounter notes, upcoming appointments, etc.  Non-urgent messages can be sent to your provider as well.   To learn more about what you can do with MyChart, go to [email or web address].    Your next appointment:   12/13/19 ARRIVE AT 10:05 TO SEE DR. Radford Pax  The format for your next appointment:   In Person  Provider:     You may see Fransico Him, MD or one of the following Advanced Practice Providers on your designated Care Team:    Melina Copa, PA-C  Ermalinda Barrios, PA-C    Other Instructions      Signed, Leanor Kail, [place]  10/17/2019 9:44 AM    [address], [place], [place]  [address] Phone: [phone]; Fax: [phone]

## 2019-10-17 ENCOUNTER — Ambulatory Visit (INDEPENDENT_AMBULATORY_CARE_PROVIDER_SITE_OTHER): Admitting: Physician Assistant

## 2019-10-17 ENCOUNTER — Other Ambulatory Visit: Payer: Self-pay

## 2019-10-17 ENCOUNTER — Encounter: Payer: Self-pay | Admitting: Physician Assistant

## 2019-10-17 VITALS — BP 100/70 | HR 75 | Ht 66.0 in | Wt 156.0 lb

## 2019-10-17 DIAGNOSIS — I25700 Atherosclerosis of coronary artery bypass graft(s), unspecified, with unstable angina pectoris: Secondary | ICD-10-CM

## 2019-10-17 DIAGNOSIS — E785 Hyperlipidemia, unspecified: Secondary | ICD-10-CM | POA: Diagnosis not present

## 2019-10-17 DIAGNOSIS — I10 Essential (primary) hypertension: Secondary | ICD-10-CM | POA: Diagnosis not present

## 2019-10-17 DIAGNOSIS — Z951 Presence of aortocoronary bypass graft: Secondary | ICD-10-CM | POA: Diagnosis not present

## 2019-10-17 MED ORDER — NITROGLYCERIN 0.4 MG SL SUBL: 0.4000 mg | SUBLINGUAL_TABLET | SUBLINGUAL | 3 refills | Status: DC | PRN

## 2019-10-17 NOTE — Patient Instructions (Addendum)
Medication Instructions:  Your physician recommends that you continue on your current medications as directed.  We have sent in a prescription for Nitroglycerin 0.4 s/l tablet to your pharmacy for you to pick up and use ONLY AS NEEDED  *If you need a refill on your cardiac medications before your next appointment, please call your pharmacy*   Lab Work: None ordered today  If you have labs (blood work) drawn today and your tests are completely normal, you will receive your results only by: Marland Kitchen MyChart Message (if you have MyChart) OR . A paper copy in the mail If you have any lab test that is abnormal or we need to change your treatment, we will call you to review the results.   Testing/Procedures: None ordered   Follow-Up: At [facility], you and your health needs are our priority.  As part of our continuing mission to provide you with exceptional heart care, we have created designated Provider Care Teams.  These Care Teams include your primary Cardiologist (physician) and Advanced Practice Providers (APPs -  Physician Assistants and Nurse Practitioners) who all work together to provide you with the care you need, when you need it.  We recommend signing up for the patient portal called "MyChart".  Sign up information is provided on this After Visit Summary.  MyChart is used to connect with patients for Virtual Visits (Telemedicine).  Patients are able to view lab/test results, encounter notes, upcoming appointments, etc.  Non-urgent messages can be sent to your provider as well.   To learn more about what you can do with MyChart, go to [email or web address].    Your next appointment:   12/13/19 ARRIVE AT 10:05 TO SEE DR. Radford Pax  The format for your next appointment:   In Person  Provider:   You may see Fransico Him, MD or one of the following Advanced Practice Providers on your designated Care Team:    Melina Copa, PA-C  Ermalinda Barrios, PA-C    Other Instructions

## 2019-10-19 ENCOUNTER — Other Ambulatory Visit: Payer: Self-pay | Admitting: Cardiothoracic Surgery

## 2019-10-19 DIAGNOSIS — Z951 Presence of aortocoronary bypass graft: Secondary | ICD-10-CM

## 2019-10-20 ENCOUNTER — Other Ambulatory Visit: Payer: Self-pay

## 2019-10-20 ENCOUNTER — Ambulatory Visit
Admission: RE | Admit: 2019-10-20 | Discharge: 2019-10-20 | Disposition: A | Discharge status: Home / Self Care | Source: Ambulatory Visit | Attending: Cardiothoracic Surgery | Admitting: Cardiothoracic Surgery

## 2019-10-20 ENCOUNTER — Ambulatory Visit (INDEPENDENT_AMBULATORY_CARE_PROVIDER_SITE_OTHER): Payer: Self-pay | Admitting: Cardiothoracic Surgery

## 2019-10-20 VITALS — BP 124/83 | HR 84 | Resp 20 | Ht 66.0 in | Wt 157.0 lb

## 2019-10-20 DIAGNOSIS — I2510 Atherosclerotic heart disease of native coronary artery without angina pectoris: Secondary | ICD-10-CM

## 2019-10-20 DIAGNOSIS — Z951 Presence of aortocoronary bypass graft: Secondary | ICD-10-CM

## 2019-10-20 NOTE — Progress Notes (Signed)
[address]       [place] [address]             [phone]      [name] Medical Record #[id number] Date of Birth: [birth year]  Referring: Sueanne Margarita, MD Primary Care: Pleas Koch, NP Primary Cardiologist: Fransico Him, MD   Chief Complaint:   POST OP FOLLOW UP OPERATIVE REPORT DATE OF PROCEDURE:  09/27/2019 PREOPERATIVE DIAGNOSIS:  24 hours following non-ST elevation myocardial infarction, incidental superior anterior mediastinal mass POSTOPERATIVE DIAGNOSIS:  Same  SURGICAL PROCEDURE:  1/Coronary artery bypass grafting x4 with the left internal mammary to the left anterior descending coronary artery, reverse saphenous vein graft to the diagonal coronary artery, left radial artery to the circumflex coronary artery,  reverse saphenous vein graft to the posterolateral branch of the right coronary artery with left radial artery harvesting open and endoscopic vein harvesting, right greater saphenous thigh and calf.  2/ resection of incidental superior anterior mediastinal mass, 3.8 cm in size.  History of Present Illness:      Patient returns office today in follow-up after recent coronary artery bypass grafting done on urgent basis.  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  Patient continues to make good progress increase his physical activities appropriately said no recurrent angina.    Past Medical History:  Diagnosis Date  . Allergy    seasonal  . CAD (coronary artery disease) of bypass graft    s/p CABG x 4  . GERD (gastroesophageal reflux disease)   . Hyperlipidemia    no meds   . Hypertension      Social History   Tobacco Use  Smoking Status Former Smoker  . Types: Cigarettes  . Quit date: 06/23/1989  . Years since quitting: 30.3  Smokeless Tobacco Former Systems developer    Social History   Substance and Sexual Activity   Alcohol Use No     Allergies  Allergen Reactions  . Statins Anaphylaxis    Pt does not remember which statin caused the reaction  . Drug [Tape] Hives    Reaction to some forms of bandaids - please use paper tape    Current Outpatient Medications  Medication Sig Dispense Refill  . acetaminophen (TYLENOL) 500 MG tablet Take 2 tablets (1,000 mg total) by mouth every 6 (six) hours as needed for mild pain, fever or headache. 30 tablet 0  . Ascorbic Acid (VITAMIN C) 1000 MG tablet Take 2,000 mg by mouth at bedtime.    Marland Kitchen aspirin 81 MG chewable tablet Chew 1 tablet (81 mg total) by mouth daily.    . cholecalciferol (VITAMIN D) 25 MCG (1000 UNIT) tablet Take 2,000 Units by mouth at bedtime.    . clopidogrel (PLAVIX) 75 MG tablet Take 1 tablet (75 mg total) by mouth daily. 30 tablet 3  . Evolocumab (REPATHA SURECLICK) 462 MG/ML SOAJ Inject 140 mg into the skin every 14 (fourteen) days. 2 mL 11  . ezetimibe (ZETIA) 10 MG tablet Take 1 tablet (10 mg total) by mouth at bedtime. 30 tablet 3  . isosorbide mononitrate (IMDUR) 30 MG 24 hr tablet Take 1 tablet (30 mg total) by mouth daily. 30 tablet 0  . metoprolol tartrate (LOPRESSOR) 25 MG tablet Take 1 tablet (25 mg total) by mouth 2 (two) times daily. 60 tablet 3  . Multiple Vitamins-Minerals (ZINC PO) Take 1 tablet  by mouth at bedtime.    . nitroGLYCERIN (NITROSTAT) 0.4 MG SL tablet Place 1 tablet (0.4 mg total) under the tongue every 5 (five) minutes as needed. 25 tablet 3  . oxyCODONE (OXY IR/ROXICODONE) 5 MG immediate release tablet Take 1 tablet (5 mg total) by mouth every 4 (four) hours as needed for severe pain. 30 tablet 0  . promethazine (PHENERGAN) 25 MG suppository Place 1 suppository (25 mg total) rectally every 6 (six) hours as needed for nausea. 12 suppository 1   No current facility-administered medications for this visit.       Physical Exam: BP 124/83   Pulse 84   Resp 20   Ht 5\' 6"  (1.676 m)   Wt 157 lb (71.2 kg)   SpO2  95% Comment: RA  BMI 25.34 kg/m   General appearance: alert, cooperative and no distress Neurologic: intact Heart: regular rate and rhythm, S1, S2 normal, no murmur, click, rub or gallop Lungs: clear to auscultation bilaterally Abdomen: soft, non-tender; bowel sounds normal; no masses,  no organomegaly Extremities: extremities normal, atraumatic, no cyanosis or edema and Homans sign is negative, no sign of DVT Wound: Sternum is stable and well-healed Left radial artery harvest left radial artery harvest sites healing well, left hand is neuro vascularly intact  Diagnostic Studies & Laboratory data:     Recent Radiology Findings:   DG Chest 2 View  Result Date: 10/20/2019 CLINICAL DATA:  Status post CABG EXAM: CHEST - 2 VIEW COMPARISON:  10/01/2019, CT 09/26/2019 FINDINGS: Post sternotomy changes. No focal opacity. Resolution of previously noted right pneumothorax. Trace residual pleural effusion on lateral view only. Normal cardiomediastinal silhouette. IMPRESSION: Resolution of previously noted right pneumothorax. Trace residual pleural effusion seen only on lateral view. Electronically Signed   By: Donavan Foil M.D.   On: 10/20/2019 15:17   I have independently reviewed the above radiology studies  and reviewed the findings with the patient.    Recent Lab Findings: Lab Results  Component Value Date   WBC 10.3 09/30/2019   HGB 10.2 (L) 09/30/2019   HCT 31.0 (L) 09/30/2019   PLT 219 09/30/2019   GLUCOSE 112 (H) 09/30/2019   CHOL 191 09/26/2019   TRIG 164 (H) 09/26/2019   HDL 40 (L) 09/26/2019   LDLDIRECT 97.0 09/13/2019   LDLCALC 118 (H) 09/26/2019   ALT 28 09/26/2019   AST 60 (H) 09/26/2019   NA 138 09/30/2019   K 5.2 (H) 09/30/2019   CL 101 09/30/2019   CREATININE 0.84 09/30/2019   BUN 11 09/30/2019   CO2 28 09/30/2019   TSH 1.054 09/26/2019   INR 1.3 (H) 09/27/2019   HGBA1C 5.6 09/26/2019      Assessment / Plan:   #1 status post coronary artery bypass  grafting-patient is making good progress postoperatively.  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  Plan see the patient back in 1 month  Medication Changes: No orders of the defined types were placed in this encounter.  He will complete 1 month.  Imdur for his radial artery harvest   Grace Isaac MD      [address] [place] [address] Office [phone]     10/21/2019 2:23 PM

## 2019-10-29 ENCOUNTER — Other Ambulatory Visit: Payer: Self-pay | Admitting: Physician Assistant

## 2019-11-03 ENCOUNTER — Ambulatory Visit: Admitting: Cardiothoracic Surgery

## 2019-11-04 ENCOUNTER — Telehealth (HOSPITAL_COMMUNITY): Payer: Self-pay

## 2019-11-04 ENCOUNTER — Encounter (HOSPITAL_COMMUNITY): Payer: Self-pay

## 2019-11-04 NOTE — Telephone Encounter (Signed)
Attempted to call patient in regards to Cardiac Rehab - LM on VM Mailed letter 

## 2019-11-17 ENCOUNTER — Other Ambulatory Visit: Payer: Self-pay

## 2019-11-17 ENCOUNTER — Other Ambulatory Visit: Admitting: *Deleted

## 2019-11-17 LAB — LIPID PANEL
Chol/HDL Ratio: 2.6 ratio (ref 0.0–5.0)
Cholesterol, Total: 101 mg/dL (ref 100–199)
HDL: 39 mg/dL — ABNORMAL LOW (ref >39)
LDL Chol Calc (NIH): 33 mg/dL (ref 0–99)
Triglycerides: 179 mg/dL — ABNORMAL HIGH (ref 0–149)
VLDL Cholesterol Cal: 29 mg/dL (ref 5–40)

## 2019-11-17 LAB — HEPATIC FUNCTION PANEL
ALT: 27 IU/L (ref 0–44)
AST: 19 IU/L (ref 0–40)
Albumin: 4.8 g/dL (ref 3.8–4.9)
Alkaline Phosphatase: 88 IU/L (ref 44–121)
Bilirubin Total: 0.3 mg/dL (ref 0.0–1.2)
Bilirubin, Direct: <0.1 mg/dL (ref 0.00–0.40)
Total Protein: 7.3 g/dL (ref 6.0–8.5)

## 2019-11-18 ENCOUNTER — Telehealth (HOSPITAL_COMMUNITY): Payer: Self-pay | Admitting: Pharmacist

## 2019-11-18 NOTE — Telephone Encounter (Signed)
Called patient to review follow-up lipid panel since starting Repatha injections in August. Patient reports tolerating the injections well and is very pleased with his lab results. Triglycerides are still above goal of <150 mg/dL, but he would like to continue to work on his diet and exercise in addition to continuing [name] and ezetimibe before starting Vascepa at this time. He has a follow-up appointment scheduled with Dr. Radford Pax on 12/13/19.

## 2019-11-21 ENCOUNTER — Telehealth (HOSPITAL_COMMUNITY): Payer: Self-pay

## 2019-11-21 NOTE — Telephone Encounter (Signed)
No response from pt regarding CR.  Closed referral.  

## 2019-11-24 ENCOUNTER — Ambulatory Visit (INDEPENDENT_AMBULATORY_CARE_PROVIDER_SITE_OTHER): Payer: Self-pay | Admitting: Cardiothoracic Surgery

## 2019-11-24 ENCOUNTER — Other Ambulatory Visit: Payer: Self-pay

## 2019-11-24 VITALS — BP 130/89 | HR 62 | Temp 97.9°F | Resp 20 | Ht 66.0 in | Wt 157.0 lb

## 2019-11-24 DIAGNOSIS — Z951 Presence of aortocoronary bypass graft: Secondary | ICD-10-CM

## 2019-11-24 DIAGNOSIS — I2510 Atherosclerotic heart disease of native coronary artery without angina pectoris: Secondary | ICD-10-CM

## 2019-11-24 NOTE — Progress Notes (Signed)
[address]       [place] [address]             [phone]      Crandall W Belluomini [facility] Medical Record #[id number] Date of Birth: December 09, 1960  Referring: Sueanne Margarita, MD Primary Care: Pleas Koch, NP Primary Cardiologist: Fransico Him, MD   Chief Complaint:   POST OP FOLLOW UP OPERATIVE REPORT DATE OF PROCEDURE:  09/27/2019 PREOPERATIVE DIAGNOSIS:  24 hours following non-ST elevation myocardial infarction, incidental superior anterior mediastinal mass POSTOPERATIVE DIAGNOSIS:  Same  SURGICAL PROCEDURE:  1/Coronary artery bypass grafting x4 with the left internal mammary to the left anterior descending coronary artery, reverse saphenous vein graft to the diagonal coronary artery, left radial artery to the circumflex coronary artery,  reverse saphenous vein graft to the posterolateral branch of the right coronary artery with left radial artery harvesting open and endoscopic vein harvesting, right greater saphenous thigh and calf.  2/ resection of incidental superior anterior mediastinal mass, 3.8 cm in size.  History of Present Illness:      Patient returns office today in follow-up after  coronary artery bypass grafting done on urgent basis.  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  Patient continues to make good progress increase his physical activities appropriately said no recurrent angina.  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    Past Medical History:  Diagnosis Date  . Allergy    seasonal  . CAD (coronary artery disease) of bypass graft    s/p CABG x 4  . GERD (gastroesophageal reflux disease)   . Hyperlipidemia    no meds   . Hypertension      Social History   Tobacco Use  Smoking Status  Former Smoker  . Types: Cigarettes  . Quit date: 06/23/1989  . Years since quitting: 30.4  Smokeless Tobacco Former Systems developer    Social History   Substance and Sexual Activity  Alcohol Use No     Allergies  Allergen Reactions  . Statins Anaphylaxis    Pt does not remember which statin caused the reaction  . Drug [Tape] Hives    Reaction to some forms of bandaids - please use paper tape    Current Outpatient Medications  Medication Sig Dispense Refill  . acetaminophen (TYLENOL) 500 MG tablet Take 2 tablets (1,000 mg total) by mouth every 6 (six) hours as needed for mild pain, fever or headache. 30 tablet 0  . Ascorbic Acid (VITAMIN C) 1000 MG tablet Take 2,000 mg by mouth at bedtime.    Marland Kitchen aspirin 81 MG chewable tablet Chew 1 tablet (81 mg total) by mouth daily.    . cholecalciferol (VITAMIN D) 25 MCG (1000 UNIT) tablet Take 2,000 Units by mouth at bedtime.    . clopidogrel (PLAVIX) 75 MG tablet Take 1 tablet (75 mg total) by mouth daily. 30 tablet 3  . ezetimibe (ZETIA) 10 MG tablet Take 1 tablet (10 mg total) by mouth at bedtime. 30 tablet 3  . Garlic 683 MG TABS Take by mouth daily.    . metoprolol tartrate (LOPRESSOR) 25 MG tablet Take 1 tablet (25 mg total) by mouth 2 (two) times daily. 60 tablet 3  . Evolocumab (REPATHA SURECLICK) 419 MG/ML SOAJ Inject 140 mg into the skin  every 14 (fourteen) days. (Patient not taking: Reported on 11/24/2019) 2 mL 11  . Multiple Vitamins-Minerals (ZINC PO) Take 1 tablet by mouth at bedtime. (Patient not taking: Reported on 11/24/2019)    . nitroGLYCERIN (NITROSTAT) 0.4 MG SL tablet Place 1 tablet (0.4 mg total) under the tongue every 5 (five) minutes as needed. (Patient not taking: Reported on 11/24/2019) 25 tablet 3   No current facility-administered medications for this visit.       Physical Exam: BP 130/89   Pulse 62   Temp 97.9 F (36.6 C) (Skin)   Resp 20   Ht 5\' 6"  (1.676 m)   Wt 157 lb (71.2 kg)   SpO2 96% Comment: RA  BMI 25.34  kg/m   General appearance: alert, cooperative and no distress Head: Normocephalic, without obvious abnormality, atraumatic Neck: no adenopathy, no carotid bruit, no JVD, supple, symmetrical, trachea midline and thyroid not enlarged, symmetric, no tenderness/mass/nodules Lymph nodes: Cervical, supraclavicular, and axillary nodes normal. Resp: clear to auscultation bilaterally Cardio: regular rate and rhythm, S1, S2 normal, no murmur, click, rub or gallop GI: soft, non-tender; bowel sounds normal; no masses,  no organomegaly Extremities: extremities normal, atraumatic, no cyanosis or edema and Homans sign is negative, no sign of DVT Neurologic: Grossly normal  Diagnostic Studies & Laboratory data:     Recent Radiology Findings:      Recent Lab Findings: Lab Results  Component Value Date   WBC 10.3 09/30/2019   HGB 10.2 (L) 09/30/2019   HCT 31.0 (L) 09/30/2019   PLT 219 09/30/2019   GLUCOSE 112 (H) 09/30/2019   CHOL 101 11/17/2019   TRIG 179 (H) 11/17/2019   HDL 39 (L) 11/17/2019   LDLDIRECT 97.0 09/13/2019   LDLCALC 33 11/17/2019   ALT 27 11/17/2019   AST 19 11/17/2019   NA 138 09/30/2019   K 5.2 (H) 09/30/2019   CL 101 09/30/2019   CREATININE 0.84 09/30/2019   BUN 11 09/30/2019   CO2 28 09/30/2019   TSH 1.054 09/26/2019   INR 1.3 (H) 09/27/2019   HGBA1C 5.6 09/26/2019      Assessment / Plan:   #1 status post coronary artery bypass grafting-patient doing well postoperatively.   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     Grace Isaac MD      [address] [place] [address] Office [phone]     11/24/2019 4:20 PM

## 2019-12-12 ENCOUNTER — Telehealth: Payer: Self-pay | Admitting: Cardiology

## 2019-12-12 DIAGNOSIS — I25700 Atherosclerosis of coronary artery bypass graft(s), unspecified, with unstable angina pectoris: Secondary | ICD-10-CM

## 2019-12-12 NOTE — Telephone Encounter (Addendum)
Spoke with pt and needs stress test to go back to work and is needing to return ASAP Per pt has been walking 4-5 miles a day and doing household chores without any problems Will forward to [place] for review ./cy

## 2019-12-12 NOTE — Telephone Encounter (Signed)
Evan Rivera is calling stating he is needing a stress test in order to go back to work and is requesting that be scheduled asap. Please advise.

## 2019-12-13 ENCOUNTER — Ambulatory Visit: Admitting: Cardiology

## 2019-12-14 NOTE — Telephone Encounter (Signed)
Pt has 1 month f/u scheduled with Dr Radford Pax for 12/19/2019 at 240pm.

## 2019-12-19 ENCOUNTER — Encounter: Payer: Self-pay | Admitting: Cardiology

## 2019-12-19 ENCOUNTER — Other Ambulatory Visit: Payer: Self-pay

## 2019-12-19 ENCOUNTER — Ambulatory Visit (INDEPENDENT_AMBULATORY_CARE_PROVIDER_SITE_OTHER): Admitting: Cardiology

## 2019-12-19 VITALS — BP 124/80 | HR 76 | Ht 66.0 in | Wt 156.0 lb

## 2019-12-19 DIAGNOSIS — E785 Hyperlipidemia, unspecified: Secondary | ICD-10-CM | POA: Diagnosis not present

## 2019-12-19 DIAGNOSIS — I2583 Coronary atherosclerosis due to lipid rich plaque: Secondary | ICD-10-CM

## 2019-12-19 DIAGNOSIS — I10 Essential (primary) hypertension: Secondary | ICD-10-CM

## 2019-12-19 DIAGNOSIS — I2510 Atherosclerotic heart disease of native coronary artery without angina pectoris: Secondary | ICD-10-CM

## 2019-12-19 NOTE — Addendum Note (Signed)
Addended by: Antonieta Iba on: 12/19/2019 03:10 PM   Modules accepted: Orders

## 2019-12-19 NOTE — Progress Notes (Addendum)
Cardiology Office Note    Date:  12/19/2019   ID:  CHEVEZ SAMBRANO, DOB Dec 22, 1960, MRN [id number]  PCP:  Pleas Koch, NP  Cardiologist: Dr. Radford Pax  Chief Complaint: CAD, HTN, HLD  History of Present Illness:   Evan Rivera is a 59 y.o. male with hx of HTN and HLD who  recently presented to hospital for chest pain and ruled in for NSTEMI. Cath showed multivessel CAD and underwent CABG x 4. He was started on Imdur for his radial artery harvest.  Hospital course complicated by R sided pneumothorax with improvement on repeat chest x-ray.   He was seen back by my PA in the office and he was walking about half a mile every day without chest pain or shortness of breath.  He is statin intolerant and is on Repatha.  He is here today for followup and is doing well.  He denies any anginal chest pain or pressure (except when taking a deep breath in), SOB, DOE, PND, orthopnea, LE edema, dizziness, palpitations or syncope. He is compliant with his meds and is tolerating meds with no SE.    Past Medical History:  Diagnosis Date  . Allergy    seasonal  . CAD (coronary artery disease) of bypass graft    s/p CABG x 4  . GERD (gastroesophageal reflux disease)   . Hyperlipidemia    no meds   . Hypertension     Past Surgical History:  Procedure Laterality Date  . COLONOSCOPY     > 10 yeras ago- normal per pt.   . CORONARY ARTERY BYPASS GRAFT N/A 09/27/2019   Procedure: CORONARY ARTERY BYPASS GRAFTING (CABG), ON PUMP, TIMES 4, USING LEFT INTERNAL MAMMARY ARTERY, LEFT RADIAL ARTERY, AND ENDOSCOPICALLY HARVESTED RIGHT GREATER SAPHENOUS VEIN. LIMA to LAD, LEFT RADIAL ARTERY to OM1, SVG to DIAG 1, SVG to PLB;  Surgeon: Grace Isaac, MD;  Location: [facility];  Service: Open Heart Surgery;  Laterality: N/A;  . ENDOVEIN HARVEST OF GREATER SAPHENOUS VEIN Right 09/27/2019   Procedure: ENDOVEIN HARVEST OF GREATER SAPHENOUS VEIN;  Surgeon: Grace Isaac, MD;  Location: [facility];  Service: Open  Heart Surgery;  Laterality: Right;  . HERNIA REPAIR     umbilical   . LEFT HEART CATH AND CORONARY ANGIOGRAPHY N/A 09/26/2019   Procedure: LEFT HEART CATH AND CORONARY ANGIOGRAPHY;  Surgeon: Martinique, Peter M, MD;  Location: [facility];  Service: Cardiovascular;  Laterality: N/A;  . RADIAL ARTERY HARVEST Left 09/27/2019   Procedure: RADIAL ARTERY HARVEST;  Surgeon: Grace Isaac, MD;  Location: [facility];  Service: Open Heart Surgery;  Laterality: Left;  . RESECTION OF MEDIASTINAL MASS N/A 09/27/2019   Procedure: RESECTION OF INCIDENTAL ANTERIOR  MEDIASTINAL MASS;  Surgeon: Grace Isaac, MD;  Location: [facility];  Service: Open Heart Surgery;  Laterality: N/A;  . TEE WITHOUT CARDIOVERSION N/A 09/27/2019   Procedure: TRANSESOPHAGEAL ECHOCARDIOGRAM (TEE);  Surgeon: Grace Isaac, MD;  Location: [facility];  Service: Open Heart Surgery;  Laterality: N/A;  . TONSILLECTOMY      Current Medications: Prior to Admission medications   Medication Sig Start Date End Date Taking? Authorizing Provider  acetaminophen (TYLENOL) 500 MG tablet Take 2 tablets (1,000 mg total) by mouth every 6 (six) hours as needed for mild pain, fever or headache. 10/01/19   Barrett, Lodema Hong, PA-C  Ascorbic Acid (VITAMIN C) 1000 MG tablet Take 2,000 mg by mouth at bedtime.    [provider]  aspirin 81 MG chewable tablet Chew 1 tablet (81 mg total) by mouth daily. 10/01/19   Barrett, Erin R, PA-C  cholecalciferol (VITAMIN D) 25 MCG (1000 UNIT) tablet Take 2,000 Units by mouth at bedtime.    [provider]  clopidogrel (PLAVIX) 75 MG tablet Take 1 tablet (75 mg total) by mouth daily. 10/01/19   Barrett, Erin R, PA-C  Evolocumab (REPATHA SURECLICK) 741 MG/ML SOAJ Inject 140 mg into the skin every 14 (fourteen) days. 10/12/19   Leanor Kail, PA  ezetimibe (ZETIA) 10 MG tablet Take 1 tablet (10 mg total) by mouth at bedtime. 10/01/19   Barrett, Erin R, PA-C  isosorbide mononitrate (IMDUR) 30 MG 24 hr  tablet Take 1 tablet (30 mg total) by mouth daily. 10/01/19   Barrett, Erin R, PA-C  metoprolol tartrate (LOPRESSOR) 25 MG tablet Take 1 tablet (25 mg total) by mouth 2 (two) times daily. 10/01/19   Barrett, Erin R, PA-C  Multiple Vitamins-Minerals (ZINC PO) Take 1 tablet by mouth at bedtime.    [provider]  oxyCODONE (OXY IR/ROXICODONE) 5 MG immediate release tablet Take 1 tablet (5 mg total) by mouth every 4 (four) hours as needed for severe pain. 10/01/19   Barrett, Lodema Hong, PA-C  promethazine (PHENERGAN) 25 MG suppository Place 1 suppository (25 mg total) rectally every 6 (six) hours as needed for nausea. 10/02/19 10/01/20  Wonda Olds, MD    Allergies:   Statins and Drug [tape]   Social History   Socioeconomic History  . Marital status: Married    Spouse name: Not on file  . Number of children: Not on file  . Years of education: Not on file  . Highest education level: Not on file  Occupational History  . Not on file  Tobacco Use  . Smoking status: Former Smoker    Types: Cigarettes    Quit date: 06/23/1989    Years since quitting: 30.5  . Smokeless tobacco: Former Network engineer and Sexual Activity  . Alcohol use: No  . Drug use: No  . Sexual activity: Not on file  Other Topics Concern  . Not on file  Social History Narrative   Married.   2 children.   Works as a Administrator.   Enjoys kayaking.    [employer] years.     Social Determinants of Health   Financial Resource Strain:   . Difficulty of Paying Living Expenses: Not on file  Food Insecurity:   . Worried About Charity fundraiser in the Last Year: Not on file  . Ran Out of Food in the Last Year: Not on file  Transportation Needs:   . Lack of Transportation (Medical): Not on file  . Lack of Transportation (Non-Medical): Not on file  Physical Activity:   . Days of Exercise per Week: Not on file  . Minutes of Exercise per Session: Not on file  Stress:   . Feeling of Stress : Not on file  Social  Connections:   . Frequency of Communication with Friends and Family: Not on file  . Frequency of Social Gatherings with Friends and Family: Not on file  . Attends Religious Services: Not on file  . Active Member of Clubs or Organizations: Not on file  . Attends Archivist Meetings: Not on file  . Marital Status: Not on file     Family History:  The patient's family history includes Heart disease in his mother; Hyperlipidemia in his  father; Hypertension in his father; Prostate cancer in his father.   ROS:   Please see the history of present illness.    ROS All other systems reviewed and are negative.   PHYSICAL EXAM:   VS:  BP 124/80   Pulse 76   Ht 5\' 6"  (1.676 m)   Wt 156 lb (70.8 kg)   SpO2 98%   BMI 25.18 kg/m    GEN: Well nourished, well developed in no acute distress HEENT: Normal NECK: No JVD; No carotid bruits LYMPHATICS: No lymphadenopathy CARDIAC:RRR, no murmurs, rubs, gallops RESPIRATORY:  Clear to auscultation without rales, wheezing or rhonchi  ABDOMEN: Soft, non-tender, non-distended MUSCULOSKELETAL:  No edema; No deformity  SKIN: Warm and dry NEUROLOGIC:  Alert and oriented x 3 PSYCHIATRIC:  Normal affect   Wt Readings from Last 3 Encounters:  12/19/19 156 lb (70.8 kg)  11/24/19 157 lb (71.2 kg)  10/20/19 157 lb (71.2 kg)      Studies/Labs Reviewed:   EKG:  EKG is not ordered today  Recent Labs: 09/26/2019: TSH 1.054 09/28/2019: Magnesium 1.2 09/30/2019: BUN 11; Creatinine, Ser 0.84; Hemoglobin 10.2; Platelets 219; Potassium 5.2; Sodium 138 11/17/2019: ALT 27   Lipid Panel    Component Value Date/Time   CHOL 101 11/17/2019 0746   TRIG 179 (H) 11/17/2019 0746   HDL 39 (L) 11/17/2019 0746   CHOLHDL 2.6 11/17/2019 0746   CHOLHDL 4.8 09/26/2019 0655   VLDL 33 09/26/2019 0655   LDLCALC 33 11/17/2019 0746   LDLDIRECT 97.0 09/13/2019 1526    Additional studies/ records that were reviewed today include:   CABG  09/27/19 LIMA->LAD SVG->D1 L. Radial Artery->OM SVG->PLB of RCA  Echocardiogram: 09/26/19 1. Left ventricular ejection fraction, by estimation, is 55 to 60%. The  left ventricle has normal function. The left ventricle has no regional  wall motion abnormalities. Left ventricular diastolic parameters were  normal.  2. Right ventricular systolic function is normal. The right ventricular  size is normal. Tricuspid regurgitation signal is inadequate for assessing  PA pressure.  3. The mitral valve is grossly normal. Trivial mitral valve  regurgitation. No evidence of mitral stenosis.  4. The aortic valve is tricuspid. Aortic valve regurgitation is trivial.  No aortic stenosis is present.  5. The inferior vena cava is dilated in size with <50% respiratory  variability, suggesting right atrial pressure of 15 mmHg.   Cardiac Catheterization: 09/26/19 LEFT HEART CATH AND CORONARY ANGIOGRAPHY  Conclusion    Prox LAD lesion is 70% stenosed.  Mid LAD lesion is 80% stenosed.  2nd Diag lesion is 90% stenosed.  Ost Cx to Prox Cx lesion is 90% stenosed.  1st Mrg lesion is 70% stenosed.  Prox RCA lesion is 85% stenosed.  Prox RCA to Mid RCA lesion is 100% stenosed.  There is mild left ventricular systolic dysfunction.  LV end diastolic pressure is mildly elevated.  The left ventricular ejection fraction is 50-55% by visual estimate.   1. Severe 3 vessel obstructive CAD 2. Mild LV impairment. EF 50% with global HK. 3. Mildly elevated LVEDP  Plan: CT surgery consultation for CABG.  ASSESSMENT & PLAN:   1. CAD s/p CABG x 4 -he has not had any anginal symptoms - Continue ASA, Plavix and BB.  -he needs a stress test before he goes back to work so I will order an ETT  2. HTN -Bp is well controlled on exam -continue Lopressor 25mg  BID  3. HLD - 09/26/2019: VLDL 33 11/17/2019: Cholesterol, Total 101;  HDL 39; LDL Chol Calc (NIH) 33; Triglycerides 179  - Hx of statin  intolerance - On Repatha and Zetia 10mg  daily - LDL was 39 in Sept 2021  4. Post op pneumothorax -Improved on repeat x-ray prior to discharge.     Medication Adjustments/Labs and Tests Ordered: Current medicines are reviewed at length with the patient today.  Concerns regarding medicines are outlined above.  Medication changes, Labs and Tests ordered today are listed in the Patient Instructions below. Patient Instructions       Signed, Fransico Him, MD  12/19/2019 3:01 PM    [facility] [place], [place], [place]  [address] Phone: [phone]; Fax: [phone]

## 2019-12-19 NOTE — Patient Instructions (Addendum)
  Medication Instructions:  Your physician recommends that you continue on your current medications as directed. Please refer to the Current Medication list given to you today.  *If you need a refill on your cardiac medications before your next appointment, please call your pharmacy*  Testing/Procedures: Your physician has requested that you have an exercise tolerance test. For further information please visit [email or web address]. Please also follow instruction sheet, as given.  Follow-Up: At [facility], you and your health needs are our priority.  As part of our continuing mission to provide you with exceptional heart care, we have created designated Provider Care Teams.  These Care Teams include your primary Cardiologist (physician) and Advanced Practice Providers (APPs -  Physician Assistants and Nurse Practitioners) who all work together to provide you with the care you need, when you need it.  Your next appointment:   6 month(s)  The format for your next appointment:   In Person  Provider:   You may see Fransico Him, MD or one of the following Advanced Practice Providers on your designated Care Team:    Melina Copa, PA-C  Ermalinda Barrios, PA-C

## 2019-12-20 NOTE — Telephone Encounter (Signed)
Patient states he actually needs an echo not a stress test in order to get back to work.

## 2019-12-20 NOTE — Telephone Encounter (Signed)
Yes 2D echo for CAD is fine

## 2019-12-20 NOTE — Telephone Encounter (Signed)
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 I ghave scheduled the patient for an echocardiogram 11/02.

## 2019-12-27 ENCOUNTER — Ambulatory Visit (HOSPITAL_COMMUNITY): Attending: Cardiovascular Disease

## 2019-12-27 ENCOUNTER — Other Ambulatory Visit: Payer: Self-pay

## 2019-12-27 DIAGNOSIS — I25700 Atherosclerosis of coronary artery bypass graft(s), unspecified, with unstable angina pectoris: Secondary | ICD-10-CM

## 2019-12-27 LAB — ECHOCARDIOGRAM COMPLETE
Area-P 1/2: 3.31 cm2
P 1/2 time: 580 msec
S' Lateral: 3 cm

## 2019-12-30 ENCOUNTER — Telehealth: Payer: Self-pay | Admitting: Cardiology

## 2019-12-30 ENCOUNTER — Other Ambulatory Visit (HOSPITAL_COMMUNITY)

## 2019-12-30 DIAGNOSIS — Z0279 Encounter for issue of other medical certificate: Secondary | ICD-10-CM

## 2019-12-30 NOTE — Telephone Encounter (Signed)
Patient dropped off DOT form for Dr. Radford Pax to complete & sign. Form placed in Dr. Theodosia Blender box 12/30/19  Evan Rivera

## 2020-01-03 ENCOUNTER — Encounter

## 2020-01-03 ENCOUNTER — Other Ambulatory Visit (HOSPITAL_COMMUNITY)

## 2020-01-09 ENCOUNTER — Telehealth: Payer: Self-pay | Admitting: Cardiology

## 2020-01-09 NOTE — Telephone Encounter (Signed)
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

## 2020-01-09 NOTE — Telephone Encounter (Signed)
Pt aware forms are on cart awaiting Dr Radford Pax to sign. Dr Radford Pax not here until Wednesday 11/10/21cy

## 2020-01-11 ENCOUNTER — Telehealth: Payer: Self-pay | Admitting: Cardiology

## 2020-01-11 NOTE — Telephone Encounter (Signed)
Form has been signed and given to HIM

## 2020-01-11 NOTE — Telephone Encounter (Signed)
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 L/M with patient that it is ready for pick up.

## 2020-01-23 ENCOUNTER — Other Ambulatory Visit: Payer: Self-pay

## 2020-01-23 DIAGNOSIS — E785 Hyperlipidemia, unspecified: Secondary | ICD-10-CM

## 2020-01-23 MED ORDER — REPATHA SURECLICK 140 MG/ML ~~LOC~~ SOAJ: 140.0000 mg | SUBCUTANEOUS | 11 refills | Status: DC

## 2020-01-23 NOTE — Telephone Encounter (Signed)
[facility] mail order pharmacy is requesting a refill for pt's Repatha. Please address

## 2020-02-01 ENCOUNTER — Other Ambulatory Visit: Payer: Self-pay | Admitting: Physician Assistant

## 2020-02-01 ENCOUNTER — Telehealth: Payer: Self-pay | Admitting: Cardiology

## 2020-02-01 DIAGNOSIS — E785 Hyperlipidemia, unspecified: Secondary | ICD-10-CM

## 2020-02-01 MED ORDER — METOPROLOL TARTRATE 25 MG PO TABS: 25.0000 mg | ORAL_TABLET | Freq: Two times a day (BID) | ORAL | 3 refills | Status: DC

## 2020-02-01 MED ORDER — EZETIMIBE 10 MG PO TABS: 10.0000 mg | ORAL_TABLET | Freq: Every day | ORAL | 3 refills | Status: DC

## 2020-02-01 MED ORDER — REPATHA SURECLICK 140 MG/ML ~~LOC~~ SOAJ: 140.0000 mg | SUBCUTANEOUS | 3 refills | Status: DC

## 2020-02-01 MED ORDER — CLOPIDOGREL BISULFATE 75 MG PO TABS: 75.0000 mg | ORAL_TABLET | Freq: Every day | ORAL | 3 refills | Status: DC

## 2020-02-01 NOTE — Telephone Encounter (Signed)
Patient would like for Dr. Radford Pax or nurse to call.

## 2020-02-01 NOTE — Telephone Encounter (Signed)
Spoke with the patient and he needs refills on zetia, lopressor, and plavix. He also states that his repatha will be cheaper with a 90 day supply. He is requesting [facility] and other medications [facility] to [facility]. Rx have been sent in.

## 2020-04-12 ENCOUNTER — Other Ambulatory Visit: Payer: Self-pay | Admitting: Cardiothoracic Surgery

## 2020-04-12 DIAGNOSIS — J9859 Other diseases of mediastinum, not elsewhere classified: Secondary | ICD-10-CM

## 2020-05-16 NOTE — Progress Notes (Signed)
[address]       [place] [address]             [phone]      Trevin W Foss [place] Medical Record #[id number] Date of Birth: 16-Dec-1960  Referring: Sueanne Margarita, MD Primary Care: Pleas Koch, NP Primary Cardiologist: Fransico Him, MD   Chief Complaint:   POST OP FOLLOW UP OPERATIVE REPORT DATE OF PROCEDURE:  09/27/2019 PREOPERATIVE DIAGNOSIS:  24 hours following non-ST elevation myocardial infarction, incidental superior anterior mediastinal mass POSTOPERATIVE DIAGNOSIS:  Same  SURGICAL PROCEDURE:  1/Coronary artery bypass grafting x4 with the left internal mammary to the left anterior descending coronary artery, reverse saphenous vein graft to the diagonal coronary artery, left radial artery to the circumflex coronary artery,  reverse saphenous vein graft to the posterolateral branch of the right coronary artery with left radial artery harvesting open and endoscopic vein harvesting, right greater saphenous thigh and calf.  2/ resection of incidental superior anterior mediastinal mass, 3.8 cm in size.   SURGICAL PATHOLOGY  CASE: [id number]  PATIENT: Evan Rivera  Surgical Pathology Report   Clinical History: CAD (cm)   FINAL MICROSCOPIC DIAGNOSIS:   A. 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  - No malignancy identified.    INTRAOPERATIVE DIAGNOSIS:   A. Soft tissue mass, anterior mediastinum, resection: "Thyroid."  Intraoperative diagnosis rendered by Dr. Saralyn Pilar at 10:07 AM on  09/27/2019.   GROSS DESCRIPTION:   Received fresh for intraoperative consult is a 4.5 x 3 x 3 cm  well-circumscribed, lobulated portion of tan tissue, sectioned to reveal  tan-pink to tan-white soft cut surfaces. Representative portion  submitted for frozen section and subsequently submitted in block A1.  Additional representative sections submitted in  blocks A2-4. (AK  09/27/2019)   History of Present Illness:      Patient returns office today in follow-up after  coronary artery bypass grafting done on urgent basis.  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  Patient continues to make good progress increase his physical activities appropriately said no recurrent angina.  Patient returns today with a follow-up CT of the chest following resection of his mediastinal mass.  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  Past Medical History:  Diagnosis Date  . Allergy    seasonal  . CAD (coronary artery disease) of bypass graft    s/p CABG x 4  . GERD (gastroesophageal reflux disease)   . Hyperlipidemia    no meds   . Hypertension      Social History   Tobacco Use  Smoking Status Former Smoker  . Types: Cigarettes  . Quit date: 06/23/1989  . Years since quitting: 30.9  Smokeless Tobacco Former Systems developer    Social History   Substance and Sexual Activity  Alcohol Use No     Allergies  Allergen Reactions  . Statins Anaphylaxis    Pt does not remember which statin caused the reaction  . Drug [Tape] Hives    Reaction to some forms of bandaids - please use paper tape    Current Outpatient Medications  Medication Sig Dispense Refill  . acetaminophen (TYLENOL) 500 MG tablet Take 2 tablets (1,000 mg total) by mouth every 6 (six) hours as needed for mild pain, fever or headache. 30 tablet 0  . Ascorbic Acid (VITAMIN  C) 1000 MG tablet Take 2,000 mg by mouth at bedtime.    Marland Kitchen aspirin 81 MG chewable tablet Chew 1 tablet (81 mg total) by mouth daily.    . cholecalciferol (VITAMIN D) 25 MCG (1000 UNIT) tablet Take 2,000 Units by mouth at bedtime.    . clopidogrel (PLAVIX) 75 MG tablet Take 1 tablet (75 mg total) by mouth daily. 90 tablet 3  . Evolocumab (REPATHA SURECLICK) 924 MG/ML SOAJ Inject 140 mg into the skin every 14  (fourteen) days. 6 mL 3  . ezetimibe (ZETIA) 10 MG tablet Take 1 tablet (10 mg total) by mouth at bedtime. 90 tablet 3  . Garlic 268 MG TABS Take by mouth daily.    . metoprolol tartrate (LOPRESSOR) 25 MG tablet Take 1 tablet (25 mg total) by mouth 2 (two) times daily. 180 tablet 3  . vitamin E 1000 UNIT capsule Take 1,000 Units by mouth daily.    . Multiple Vitamins-Minerals (ZINC PO) Take 1 tablet by mouth at bedtime.  (Patient not taking: Reported on 05/17/2020)    . nitroGLYCERIN (NITROSTAT) 0.4 MG SL tablet Place 1 tablet (0.4 mg total) under the tongue every 5 (five) minutes as needed. 25 tablet 3   No current facility-administered medications for this visit.       Physical Exam: BP 136/89 (BP Location: Left Arm, Patient Position: Sitting)   Pulse 71   Resp 20   Ht 5\' 6"  (1.676 m)   Wt 165 lb (74.8 kg)   SpO2 97% Comment: RA  BMI 26.63 kg/m   General appearance: alert, cooperative and no distress Head: Normocephalic, without obvious abnormality, atraumatic Neck: no adenopathy, no carotid bruit, no JVD, supple, symmetrical, trachea midline and thyroid not enlarged, symmetric, no tenderness/mass/nodules Lymph nodes: Cervical, supraclavicular, and axillary nodes normal. Resp: clear to auscultation bilaterally Cardio: regular rate and rhythm, S1, S2 normal, no murmur, click, rub or gallop GI: soft, non-tender; bowel sounds normal; no masses,  no organomegaly Extremities: extremities normal, atraumatic, no cyanosis or edema and Homans sign is negative, no sign of DVT Neurologic: Grossly normal Sternum is stable and well-healed, left radial harvest site is also well-healed  Diagnostic Studies & Laboratory data:     Recent Radiology Findings:   CT CHEST WO CONTRAST  Result Date: 05/17/2020 CLINICAL DATA:  Mediastinal mass EXAM: CT CHEST WITHOUT CONTRAST TECHNIQUE: Multidetector CT imaging of the chest was performed following the standard protocol without IV contrast. COMPARISON:   09/26/2019 FINDINGS: Cardiovascular: Aortic atherosclerosis. Normal heart size. Status post coronary artery bypass grafting. Extensive 3 vessel coronary artery calcifications. No pericardial effusion. Mediastinum/Nodes: No enlarged mediastinal, hilar, or axillary lymph nodes. A previously noted mass of the superior mediastinum has been resected. Thyroid gland, trachea, and esophagus demonstrate no significant findings. Lungs/Pleura: Lungs are clear. No pleural effusion or pneumothorax. Upper Abdomen: No acute abnormality. Musculoskeletal: No chest wall mass or suspicious bone lesions identified. IMPRESSION: 1. A previously noted mass of the superior mediastinum has been resected. As on prior examination, this was presumably an exophytic nodule of the thyroid isthmus. No evidence of recurrent mass, lymphadenopathy, or metastatic disease in the chest. 2. Status post interval median sternotomy and CABG. 3. Coronary artery disease. Aortic Atherosclerosis (ICD10-I70.0). Electronically Signed   By: Eddie Candle M.D.   On: 05/17/2020 14:33  I have independently reviewed the above radiology studies  and reviewed the findings with the patient.     Recent Lab Findings: Lab Results  Component Value Date   WBC  10.3 09/30/2019   HGB 10.2 (L) 09/30/2019   HCT 31.0 (L) 09/30/2019   PLT 219 09/30/2019   GLUCOSE 112 (H) 09/30/2019   CHOL 101 11/17/2019   TRIG 179 (H) 11/17/2019   HDL 39 (L) 11/17/2019   LDLDIRECT 97.0 09/13/2019   LDLCALC 33 11/17/2019   ALT 27 11/17/2019   AST 19 11/17/2019   NA 138 09/30/2019   K 5.2 (H) 09/30/2019   CL 101 09/30/2019   CREATININE 0.84 09/30/2019   BUN 11 09/30/2019   CO2 28 09/30/2019   TSH 1.054 09/26/2019   INR 1.3 (H) 09/27/2019   HGBA1C 5.6 09/26/2019      Assessment / Plan:   #1 status post coronary artery bypass grafting-patient doing well postoperatively.  Patient denies any recurrent anginal symptoms.  Patient continues follow-up for his non-STEMI  myocardial infarction and coronary artery disease and subsequent coronary artery bypass grafting by cardiology.   #2 stable following resection of anterior mediastinal mass-pathology showed retrosternal thyroid without evidence of malignancy.-Follow-up CT scan shows complete resection without any other evidence of mediastinal mass-follow-up as needed   Medication Changes: No orders of the defined types were placed in this encounter.     Grace Isaac MD      [place] [address] [place] [address] Office [phone]     05/17/2020 4:01 PM

## 2020-05-17 ENCOUNTER — Other Ambulatory Visit: Payer: Self-pay

## 2020-05-17 ENCOUNTER — Ambulatory Visit
Admission: RE | Admit: 2020-05-17 | Discharge: 2020-05-17 | Disposition: A | Discharge status: Home / Self Care | Source: Ambulatory Visit | Attending: Cardiothoracic Surgery | Admitting: Cardiothoracic Surgery

## 2020-05-17 ENCOUNTER — Ambulatory Visit (INDEPENDENT_AMBULATORY_CARE_PROVIDER_SITE_OTHER): Admitting: Cardiothoracic Surgery

## 2020-05-17 VITALS — BP 136/89 | HR 71 | Resp 20 | Ht 66.0 in | Wt 165.0 lb

## 2020-05-17 DIAGNOSIS — J9859 Other diseases of mediastinum, not elsewhere classified: Secondary | ICD-10-CM | POA: Diagnosis not present

## 2020-08-15 ENCOUNTER — Emergency Department (HOSPITAL_COMMUNITY)

## 2020-08-15 ENCOUNTER — Emergency Department (HOSPITAL_COMMUNITY)
Admission: EM | Admit: 2020-08-15 | Discharge: 2020-08-15 | Disposition: A | Discharge status: Home / Self Care | Attending: Emergency Medicine | Admitting: Emergency Medicine

## 2020-08-15 ENCOUNTER — Other Ambulatory Visit: Payer: Self-pay

## 2020-08-15 ENCOUNTER — Encounter (HOSPITAL_COMMUNITY): Payer: Self-pay | Admitting: Pharmacy Technician

## 2020-08-15 ENCOUNTER — Telehealth: Payer: Self-pay

## 2020-08-15 DIAGNOSIS — Z20822 Contact with and (suspected) exposure to covid-19: Secondary | ICD-10-CM | POA: Diagnosis not present

## 2020-08-15 DIAGNOSIS — I2510 Atherosclerotic heart disease of native coronary artery without angina pectoris: Secondary | ICD-10-CM | POA: Diagnosis not present

## 2020-08-15 DIAGNOSIS — I10 Essential (primary) hypertension: Secondary | ICD-10-CM | POA: Diagnosis not present

## 2020-08-15 DIAGNOSIS — R0981 Nasal congestion: Secondary | ICD-10-CM | POA: Insufficient documentation

## 2020-08-15 DIAGNOSIS — Z7902 Long term (current) use of antithrombotics/antiplatelets: Secondary | ICD-10-CM | POA: Diagnosis not present

## 2020-08-15 DIAGNOSIS — Z87891 Personal history of nicotine dependence: Secondary | ICD-10-CM | POA: Insufficient documentation

## 2020-08-15 DIAGNOSIS — Z79899 Other long term (current) drug therapy: Secondary | ICD-10-CM | POA: Diagnosis not present

## 2020-08-15 DIAGNOSIS — J40 Bronchitis, not specified as acute or chronic: Secondary | ICD-10-CM

## 2020-08-15 DIAGNOSIS — Z7982 Long term (current) use of aspirin: Secondary | ICD-10-CM | POA: Insufficient documentation

## 2020-08-15 DIAGNOSIS — Z951 Presence of aortocoronary bypass graft: Secondary | ICD-10-CM | POA: Diagnosis not present

## 2020-08-15 DIAGNOSIS — R059 Cough, unspecified: Secondary | ICD-10-CM | POA: Diagnosis not present

## 2020-08-15 DIAGNOSIS — R0602 Shortness of breath: Secondary | ICD-10-CM | POA: Insufficient documentation

## 2020-08-15 LAB — CBC WITH DIFFERENTIAL/PLATELET
Abs Immature Granulocytes: 0.01 10*3/uL (ref 0.00–0.07)
Basophils Absolute: 0.1 10*3/uL (ref 0.0–0.1)
Basophils Relative: 1 %
Eosinophils Absolute: 0.4 10*3/uL (ref 0.0–0.5)
Eosinophils Relative: 8 %
HCT: 43.5 % (ref 39.0–52.0)
Hemoglobin: 14.5 g/dL (ref 13.0–17.0)
Immature Granulocytes: 0 %
Lymphocytes Relative: 23 %
Lymphs Abs: 1.3 10*3/uL (ref 0.7–4.0)
MCH: 30.8 pg (ref 26.0–34.0)
MCHC: 33.3 g/dL (ref 30.0–36.0)
MCV: 92.4 fL (ref 80.0–100.0)
Monocytes Absolute: 0.5 10*3/uL (ref 0.1–1.0)
Monocytes Relative: 9 %
Neutro Abs: 3.3 10*3/uL (ref 1.7–7.7)
Neutrophils Relative %: 59 %
Platelets: 356 10*3/uL (ref 150–400)
RBC: 4.71 MIL/uL (ref 4.22–5.81)
RDW: 13.1 % (ref 11.5–15.5)
WBC: 5.5 10*3/uL (ref 4.0–10.5)
nRBC: 0 % (ref 0.0–0.2)

## 2020-08-15 LAB — BASIC METABOLIC PANEL
Anion gap: 10 (ref 5–15)
BUN: 17 mg/dL (ref 8–23)
CO2: 24 mmol/L (ref 22–32)
Calcium: 9.5 mg/dL (ref 8.9–10.3)
Chloride: 104 mmol/L (ref 98–111)
Creatinine, Ser: 0.81 mg/dL (ref 0.61–1.24)
GFR, Estimated: >60 mL/min (ref >60)
Glucose, Bld: 86 mg/dL (ref 70–99)
Potassium: 4.1 mmol/L (ref 3.5–5.1)
Sodium: 138 mmol/L (ref 135–145)

## 2020-08-15 LAB — RESP PANEL BY RT-PCR (FLU A&B, COVID) ARPGX2
Influenza A by PCR: NEGATIVE
Influenza B by PCR: NEGATIVE
SARS Coronavirus 2 by RT PCR: NEGATIVE

## 2020-08-15 MED ORDER — PREDNISONE 10 MG (21) PO TBPK: ORAL_TABLET | Freq: Every day | ORAL | 0 refills | Status: DC

## 2020-08-15 MED ORDER — BENZONATATE 100 MG PO CAPS: 100.0000 mg | ORAL_CAPSULE | Freq: Three times a day (TID) | ORAL | 0 refills | Status: DC

## 2020-08-15 NOTE — Telephone Encounter (Signed)
[facility] - Client TELEPHONE ADVICE RECORD [facility] Patient Name: Evan Rivera Gender: Male DOB: 11-Jun-1960 Age: 60 Y 2 M 21 D Return Phone Number: [phone] (Primary) Address[place] [address] Client [facility] - Client Client Site [place] - Day Physician Alma Friendly - NP Contact Type Call Who Is Calling Patient / Member / Family / Caregiver Call Type Triage / Clinical Relationship To Patient Self Return Phone Number [phone] (Primary) Chief Complaint CHEST PAIN - pain, pressure, heaviness or tightness Reason for Call Symptomatic / Request for [place] states he has chest pressure, tightness and heaviness which worsens when laying down at night. He has hx of open heart surgery [place] Not Listed  Translation No Nurse Assessment Nurse: Clovis Riley, RN, Georgina Peer Date/Time (Eastern Time): 08/15/2020 8:34:30 AM Confirm and document reason for call. If symptomatic, describe symptoms. ---Caller states he has chest pressure, tightness and heaviness which worsens when laying down at night that started monday night. States he hears crackling when he breaths. He has hx of open heart surgery july 2021. He is a Administrator. Does the patient have any new or worsening symptoms? ---Yes Will a triage be completed? ---Yes Related visit to physician within the last 2 weeks? ---No Does the PT have any chronic conditions? (i.e. diabetes, asthma, this includes High risk factors for pregnancy, etc.) ---Yes List chronic conditions. ---Hx bypass, HTN, cholesterol Is this a behavioral health or substance abuse call? ---No Guidelines Guideline Title Affirmed Question Affirmed Notes Nurse Date/Time (Eastern Time) Chest Pain [1] Chest pain lasts > 5 minutes AND [2] occurred in past 3 days (72 hours) (Exception:  feels exactly the same as Otelia Santee 08/15/2020 8:36:54 AM PLEASE NOTE: All timestamps contained within this report are represented as [place] Standard Time. CONFIDENTIALTY NOTICE: This fax transmission is intended only for the addressee. It contains information that is legally privileged, confidential or otherwise protected from use or disclosure. If you are not the intended recipient, you are strictly prohibited from reviewing, disclosing, copying using or disseminating any of this information or taking any action in reliance on or regarding this information. If you have received this fax in error, please notify us immediately by telephone so that we can arrange for its return to [place]. Phone: [phone], Toll-Free: [phone], Fax: [phone] Page: 2 of 2 Call Id: [id number] Guidelines Guideline Title Affirmed Question Affirmed Notes Nurse Date/Time Eilene Ghazi Time) previously diagnosed heartburn and has accompanying sour taste in mouth) Disp. Time Eilene Ghazi Time) Disposition Final User 08/15/2020 8:33:23 AM Send to Urgent Queue Jamal Maes 08/15/2020 8:39:59 AM Go to ED Now (or PCP triage) Yes Clovis Riley, RN, Leilani Merl Disagree/Comply Comply Caller Understands Yes PreDisposition Did not know what to do Care Advice Given Per Guideline GO TO ED NOW (OR PCP TRIAGE): * IF NO PCP (PRIMARY CARE PROVIDER) SECOND-LEVEL TRIAGE: You need to be seen within the next hour. Go to the [facility] at _____________ [place] as soon as you can. CALL EMS IF: * Severe difficulty breathing occurs * Passes out or becomes too weak to stand * You become worse CARE ADVICE given per Chest Pain (Adult) guideline. Referrals GO TO FACILITY OTHER - SPECIFY

## 2020-08-15 NOTE — ED Provider Notes (Signed)
Emergency Medicine Provider Triage Evaluation Note  Evan Rivera , a 60 y.o. male  was evaluated in triage.  Pt complains of shortness of breath when lying down x2 days. Shortness of breath only occurs when lying down. No history of CHF. Shortness of breath associated with productive cough and sore throat. No sick contact or known COVID exposures. No fever or chills. Denies associated chest pain. No history of blood clots. No history of asthma or COPD.  Review of Systems  Positive: SOB Negative: CP  Physical Exam  BP (!) 131/99   Pulse 73   Temp 98 F (36.7 C) (Oral)   Resp 18   SpO2 97%  Gen:   Awake, no distress   Resp:  Normal effort  MSK:   Moves extremities without difficulty  Other:    Medical Decision Making  Medically screening exam initiated at 4:42 PM.  Appropriate orders placed.  Evan Rivera was informed that the remainder of the evaluation will be completed by another provider, this initial triage assessment does not replace that evaluation, and the importance of remaining in the ED until their evaluation is complete.  Shortness of breath. CXR to rule out pneumonia. Cardiac labs ordered. BNP to rule out CHF.    Suzy Bouchard, PA-C 08/15/20 1644    Dorie Rank, MD 08/16/20 [phone]

## 2020-08-15 NOTE — ED Provider Notes (Signed)
[facility] EMERGENCY DEPARTMENT Provider Note   CSN: [id number] Arrival date & time: 08/15/20  1620     History Chief Complaint  Patient presents with   Shortness of [place] is a 60 y.o. male.  60 year old male who presents with 2 days of cough and congestion.  No chest pain or chest pressure.  Negative home COVID test.  Patient states that he is fully vaccinated. no anginal type symptoms.  States symptoms are somewhat worse at night and when he lays flat.  Denies any PND.  No orthopnea.  No new leg swelling.  Called his doctor and told to come here.      Past Medical History:  Diagnosis Date   Allergy    seasonal   CAD (coronary artery disease) of bypass graft    s/p CABG x 4   GERD (gastroesophageal reflux disease)    Hyperlipidemia    no meds    Hypertension     Patient Active Problem List   Diagnosis Date Noted   S/P CABG x 4 09/27/2019   NSTEMI (non-ST elevated myocardial infarction) ([place]) 09/26/2019   Benign hypertension 09/22/2016   GERD (gastroesophageal reflux disease) 09/22/2016   Hyperlipidemia 09/22/2016    Past Surgical History:  Procedure Laterality Date   COLONOSCOPY     > 10 yeras ago- normal per pt.    CORONARY ARTERY BYPASS GRAFT N/A 09/27/2019   Procedure: CORONARY ARTERY BYPASS GRAFTING (CABG), ON PUMP, TIMES 4, USING LEFT INTERNAL MAMMARY ARTERY, LEFT RADIAL ARTERY, AND ENDOSCOPICALLY HARVESTED RIGHT GREATER SAPHENOUS VEIN. LIMA to LAD, LEFT RADIAL ARTERY to OM1, SVG to DIAG 1, SVG to PLB;  Surgeon: Grace Isaac, MD;  Location: [facility];  Service: Open Heart Surgery;  Laterality: N/A;   ENDOVEIN HARVEST OF GREATER SAPHENOUS VEIN Right 09/27/2019   Procedure: ENDOVEIN HARVEST OF GREATER SAPHENOUS VEIN;  Surgeon: Grace Isaac, MD;  Location: [facility];  Service: Open Heart Surgery;  Laterality: Right;   HERNIA REPAIR     umbilical    LEFT HEART CATH AND CORONARY ANGIOGRAPHY N/A 09/26/2019   Procedure: LEFT  HEART CATH AND CORONARY ANGIOGRAPHY;  Surgeon: Martinique, Peter M, MD;  Location: [facility];  Service: Cardiovascular;  Laterality: N/A;   RADIAL ARTERY HARVEST Left 09/27/2019   Procedure: RADIAL ARTERY HARVEST;  Surgeon: Grace Isaac, MD;  Location: [facility];  Service: Open Heart Surgery;  Laterality: Left;   RESECTION OF MEDIASTINAL MASS N/A 09/27/2019   Procedure: RESECTION OF INCIDENTAL ANTERIOR  MEDIASTINAL MASS;  Surgeon: Grace Isaac, MD;  Location: [facility];  Service: Open Heart Surgery;  Laterality: N/A;   TEE WITHOUT CARDIOVERSION N/A 09/27/2019   Procedure: TRANSESOPHAGEAL ECHOCARDIOGRAM (TEE);  Surgeon: Grace Isaac, MD;  Location: [facility] [place];  Service: Open Heart Surgery;  Laterality: N/A;   TONSILLECTOMY         Family History  Problem Relation Age of Onset   Prostate cancer Father        late 67's   Hyperlipidemia Father    Hypertension Father    Heart disease Mother    Colon cancer Neg Hx    Colon polyps Neg Hx    Esophageal cancer Neg Hx    Rectal cancer Neg Hx    Stomach cancer Neg Hx     Social History   Tobacco Use   Smoking status: Former    Pack years: 0.00    Types: Cigarettes  Quit date: 06/23/1989    Years since quitting: 31.1   Smokeless tobacco: Former  Substance Use Topics   Alcohol use: No   Drug use: No    Home Medications Prior to Admission medications   Medication Sig Start Date End Date Taking? Authorizing Provider  acetaminophen (TYLENOL) 500 MG tablet Take 2 tablets (1,000 mg total) by mouth every 6 (six) hours as needed for mild pain, fever or headache. 10/01/19   Barrett, Lodema Hong, PA-C  Ascorbic Acid (VITAMIN C) 1000 MG tablet Take 2,000 mg by mouth at bedtime.    [provider]  aspirin 81 MG chewable tablet Chew 1 tablet (81 mg total) by mouth daily. 10/01/19   Barrett, Erin R, PA-C  cholecalciferol (VITAMIN D) 25 MCG (1000 UNIT) tablet Take 2,000 Units by mouth at bedtime.    [provider]   clopidogrel (PLAVIX) 75 MG tablet Take 1 tablet (75 mg total) by mouth daily. 02/01/20   Turner, Eber Hong, MD  Evolocumab (REPATHA SURECLICK) 631 MG/ML SOAJ Inject 140 mg into the skin every 14 (fourteen) days. 02/01/20   Sueanne Margarita, MD  ezetimibe (ZETIA) 10 MG tablet Take 1 tablet (10 mg total) by mouth at bedtime. 02/01/20   Sueanne Margarita, MD  Garlic 497 MG TABS Take by mouth daily.    [provider]  metoprolol tartrate (LOPRESSOR) 25 MG tablet Take 1 tablet (25 mg total) by mouth 2 (two) times daily. 02/01/20   Sueanne Margarita, MD  Multiple Vitamins-Minerals (ZINC PO) Take 1 tablet by mouth at bedtime.  Patient not taking: Reported on 05/17/2020    [provider]  nitroGLYCERIN (NITROSTAT) 0.4 MG SL tablet Place 1 tablet (0.4 mg total) under the tongue every 5 (five) minutes as needed. 10/17/19 01/15/20  Leanor Kail, PA  vitamin E 1000 UNIT capsule Take 1,000 Units by mouth daily.    [provider]    Allergies    Statins and Drug [tape]  Review of Systems   Review of Systems  All other systems reviewed and are negative.  Physical Exam Updated Vital Signs BP 131/85   Pulse 65   Temp 98.4 F (36.9 C)   Resp 20   SpO2 100%   Physical Exam Vitals and nursing note reviewed.  Constitutional:      General: He is not in acute distress.    Appearance: Normal appearance. He is well-developed. He is not toxic-appearing.  HENT:     Head: Normocephalic and atraumatic.  Eyes:     General: Lids are normal.     Conjunctiva/sclera: Conjunctivae normal.     Pupils: Pupils are equal, round, and reactive to light.  Neck:     Thyroid: No thyroid mass.     Trachea: No tracheal deviation.  Cardiovascular:     Rate and Rhythm: Normal rate and regular rhythm.     Heart sounds: Normal heart sounds. No murmur heard.   No gallop.  Pulmonary:     Effort: Pulmonary effort is normal. No respiratory distress.     Breath sounds: Normal breath sounds. No  stridor. No decreased breath sounds, wheezing, rhonchi or rales.  Abdominal:     General: There is no distension.     Palpations: Abdomen is soft.     Tenderness: There is no abdominal tenderness. There is no rebound.  Musculoskeletal:        General: No tenderness. Normal range of motion.     Cervical back: Normal range of  motion and neck supple.  Skin:    General: Skin is warm and dry.     Findings: No abrasion or rash.  Neurological:     Mental Status: He is alert and oriented to person, place, and time. Mental status is at baseline.     GCS: GCS eye subscore is 4. GCS verbal subscore is 5. GCS motor subscore is 6.     Cranial Nerves: Cranial nerves are intact. No cranial nerve deficit.     Sensory: No sensory deficit.     Motor: Motor function is intact.  Psychiatric:        Attention and Perception: Attention normal.        Speech: Speech normal.        Behavior: Behavior normal.    ED Results / Procedures / Treatments   Labs (all labs ordered are listed, but only abnormal results are displayed) Labs Reviewed  RESP PANEL BY RT-PCR (FLU A&B, COVID) [id number]  CBC WITH DIFFERENTIAL/PLATELET  BASIC METABOLIC PANEL  BRAIN NATRIURETIC PEPTIDE    EKG EKG Interpretation  Date/Time:  Wednesday August 15 2020 16:47:39 EDT Ventricular Rate:  75 PR Interval:  150 QRS Duration: 84 QT Interval:  382 QTC Calculation: 426 R Axis:   16 Text Interpretation: Normal sinus rhythm Inferior infarct , age undetermined Abnormal ECG Confirmed by Lacretia Leigh ([id number]) on 08/15/2020 7:57:33 PM  Radiology DG Chest 2 View  Result Date: 08/15/2020 CLINICAL DATA:  Shortness of breath EXAM: CHEST - 2 VIEW COMPARISON:  10/20/2019 FINDINGS: Post sternotomy changes. No focal opacity or pleural effusion. Normal cardiomediastinal silhouette. No pneumothorax. IMPRESSION: No active cardiopulmonary disease. Electronically Signed   By: Donavan Foil M.D.   On: 08/15/2020 17:05    Procedures Procedures    Medications Ordered in ED Medications - No data to display  ED Course  I have reviewed the triage vital signs and the nursing notes.  Pertinent labs & imaging results that were available during my care of the patient were reviewed by me and considered in my medical decision making (see chart for details).    MDM Rules/Calculators/A&P                         COVID test here is negative.  Suspect bronchitis.  Will place on cough suppressants and course of prednisone  Final Clinical Impression(s) / ED Diagnoses Final diagnoses:  None    Rx / DC Orders ED Discharge Orders     None        Lacretia Leigh, MD 08/15/20 2130

## 2020-08-15 NOTE — Telephone Encounter (Signed)
I spoke with pt; pt is presently on a job and pt does plan to go to [facility] ED in 2 - 3 hrs when finishes the job he is on. Pt is not having any problems right now. Pt said worse when he lays down. Starting 08/13/20 at night has episode where has difficulty getting a breath like he has to force himself to breath and this last 30 - 40 mins. Then pt said is better. Last time happened last night also. Pt has prod cough with dark yellow-green phlegm, S/T, and head congestion. No fever.pt tested home covid test that was neg. Pt said has hx of pneumonia. UC & ED precautions given and pt voiced understanding.Pt promises if condition changes or worsens prior to finishing the job pt is on pt will stop and go to ED otherwise if no problems this morning pt will go to [facility] ED when finishes present job. Sending note to Gentry Fitz NP and [facility] CMA. Pt will cb with update later this wk.

## 2020-08-15 NOTE — Telephone Encounter (Signed)
Noted. Will await notes.  

## 2020-08-15 NOTE — ED Triage Notes (Signed)
Pt here via pov with complaints of shob when lying down at night for the last few days. Pt with productive cough as well.

## 2020-10-02 ENCOUNTER — Telehealth: Payer: Self-pay | Admitting: *Deleted

## 2020-10-02 NOTE — Telephone Encounter (Signed)
   Patient Name: LAQUINTIN ATKISON  DOB: 1960/03/11 MRN: [id number]  Primary Cardiologist: Fransico Him, MD  Chart reviewed as part of pre-operative protocol coverage.   Simple dental extractions are considered low risk procedures per guidelines and generally do not require any specific cardiac clearance. It is also generally accepted that for simple extractions and dental cleanings, there is no need to interrupt blood thinner therapy.  SBE prophylaxis is not required for the patient from a cardiac standpoint.  I will route this recommendation to the requesting party via Epic fax function and remove from pre-op pool.  Please call with questions.  [place], [place] 10/02/2020, 3:20 PM

## 2020-10-02 NOTE — Telephone Encounter (Signed)
   [facility] Pre-operative Risk Assessment    Patient Name: Evan Rivera  DOB: 11-20-1960 MRN: [id number]  HEARTCARE STAFF:  - IMPORTANT!!!!!! Under Visit Info/Reason for Call, type in Other and utilize the format Clearance MM/DD/YY or Clearance TBD. Do not use dashes or single digits. - Please review there is not already an duplicate clearance open for this procedure. - If request is for dental extraction, please clarify the # of teeth to be extracted. - If the patient is currently at the dentist's office, call Pre-Op Callback Staff (MA/nurse) to input urgent request.  - If the patient is not currently in the dentist office, please route to the Pre-Op pool.  Request for surgical clearance:  What type of surgery is being performed? 1 DENTAL EXTRACTION   When is this surgery scheduled? 10/03/20 AT 10 AM   What type of clearance is required (medical clearance vs. Pharmacy clearance to hold med vs. Both)? MEDICAL  Are there any medications that need to be held prior to surgery and how long?  ASA AND PLAVIX   Practice name and name of physician performing surgery? DR. Payton Spark  What is the office phone number? [phone]   7.   What is the office fax number? [phone]  8.   Anesthesia type (None, local, MAC, general) ? NOT LISTED    Julaine Hua 10/02/2020, 3:09 PM  _________________________________________________________________   (provider comments below)

## 2020-11-26 ENCOUNTER — Encounter: Payer: Self-pay | Admitting: Cardiology

## 2020-11-26 NOTE — Telephone Encounter (Signed)
Error

## 2020-11-29 ENCOUNTER — Telehealth: Payer: Self-pay

## 2020-11-29 DIAGNOSIS — I2583 Coronary atherosclerosis due to lipid rich plaque: Secondary | ICD-10-CM

## 2020-11-29 DIAGNOSIS — I2510 Atherosclerotic heart disease of native coronary artery without angina pectoris: Secondary | ICD-10-CM

## 2020-11-29 DIAGNOSIS — Z951 Presence of aortocoronary bypass graft: Secondary | ICD-10-CM

## 2020-11-29 DIAGNOSIS — I25700 Atherosclerosis of coronary artery bypass graft(s), unspecified, with unstable angina pectoris: Secondary | ICD-10-CM

## 2020-11-29 NOTE — Telephone Encounter (Signed)
ETT was ordered last year for the patient who did not follow through. 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 He has scheduled test for 10/04. He is also due for his yearly follow up in October this year.  Attestation is needed for his stress test.

## 2020-11-30 NOTE — Telephone Encounter (Signed)
ETT has been cancelled. Echocardiogram has been scheduled.

## 2020-12-04 ENCOUNTER — Encounter

## 2020-12-11 ENCOUNTER — Ambulatory Visit (HOSPITAL_COMMUNITY)

## 2020-12-19 ENCOUNTER — Encounter: Payer: Self-pay | Admitting: Cardiology

## 2020-12-19 ENCOUNTER — Ambulatory Visit (HOSPITAL_COMMUNITY): Attending: Cardiology

## 2020-12-19 ENCOUNTER — Other Ambulatory Visit: Payer: Self-pay

## 2020-12-19 DIAGNOSIS — I25700 Atherosclerosis of coronary artery bypass graft(s), unspecified, with unstable angina pectoris: Secondary | ICD-10-CM

## 2020-12-19 DIAGNOSIS — I2510 Atherosclerotic heart disease of native coronary artery without angina pectoris: Secondary | ICD-10-CM

## 2020-12-19 DIAGNOSIS — Z951 Presence of aortocoronary bypass graft: Secondary | ICD-10-CM

## 2020-12-19 DIAGNOSIS — I2583 Coronary atherosclerosis due to lipid rich plaque: Secondary | ICD-10-CM

## 2020-12-19 DIAGNOSIS — I351 Nonrheumatic aortic (valve) insufficiency: Secondary | ICD-10-CM | POA: Insufficient documentation

## 2020-12-19 LAB — ECHOCARDIOGRAM COMPLETE
Area-P 1/2: 3.17 cm2
S' Lateral: 2.9 cm

## 2020-12-31 ENCOUNTER — Telehealth: Payer: Self-pay | Admitting: Cardiology

## 2020-12-31 NOTE — Telephone Encounter (Signed)
Follow Up:    Patient is calling to find out the status of the paperwork is that he left here on 12-19-20?

## 2021-01-01 NOTE — Telephone Encounter (Signed)
Medical records has paperwork. Spoke with them and they will call patient to fill out appropriate forms so that Dr. Radford Pax can review and sign his paperwork.

## 2021-01-02 NOTE — Telephone Encounter (Signed)
Called patient back and scheduled him for tomorrow 11/3

## 2021-01-02 NOTE — Telephone Encounter (Signed)
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 Patient had his echocardiogram done which looked good, however the patient has not been seen in the office in over a year so he needs an office visit before paperwork can be signed. Called and offered the patient an appointment for tomorrow. Patient will call back to confirm.

## 2021-01-03 ENCOUNTER — Encounter: Payer: Self-pay | Admitting: Cardiology

## 2021-01-03 ENCOUNTER — Ambulatory Visit (INDEPENDENT_AMBULATORY_CARE_PROVIDER_SITE_OTHER): Admitting: Cardiology

## 2021-01-03 ENCOUNTER — Telehealth: Payer: Self-pay | Admitting: Cardiology

## 2021-01-03 ENCOUNTER — Other Ambulatory Visit: Payer: Self-pay

## 2021-01-03 VITALS — BP 142/98 | HR 84 | Ht 66.0 in | Wt 166.0 lb

## 2021-01-03 DIAGNOSIS — I10 Essential (primary) hypertension: Secondary | ICD-10-CM

## 2021-01-03 DIAGNOSIS — E7800 Pure hypercholesterolemia, unspecified: Secondary | ICD-10-CM | POA: Diagnosis not present

## 2021-01-03 DIAGNOSIS — E785 Hyperlipidemia, unspecified: Secondary | ICD-10-CM | POA: Diagnosis not present

## 2021-01-03 DIAGNOSIS — I2583 Coronary atherosclerosis due to lipid rich plaque: Secondary | ICD-10-CM

## 2021-01-03 DIAGNOSIS — I2510 Atherosclerotic heart disease of native coronary artery without angina pectoris: Secondary | ICD-10-CM | POA: Diagnosis not present

## 2021-01-03 MED ORDER — REPATHA SURECLICK 140 MG/ML ~~LOC~~ SOAJ: 140.0000 mg | SUBCUTANEOUS | 3 refills | Status: DC

## 2021-01-03 MED ORDER — METOPROLOL TARTRATE 50 MG PO TABS: 50.0000 mg | ORAL_TABLET | Freq: Two times a day (BID) | ORAL | 3 refills | Status: DC

## 2021-01-03 MED ORDER — EZETIMIBE 10 MG PO TABS: 10.0000 mg | ORAL_TABLET | Freq: Every day | ORAL | 3 refills | Status: DC

## 2021-01-03 NOTE — Telephone Encounter (Signed)
Patient called stating the medication metoprolol tartrate (LOPRESSOR) 50 MG tablet that was sent today to [facility] on [address] he needs to be sent to [facility] on [address]

## 2021-01-03 NOTE — Telephone Encounter (Signed)
Returned call to patient and advised that I have transferred metoprolol rx to the [facility] on [address]. He thanked me for the call.

## 2021-01-03 NOTE — Addendum Note (Signed)
Addended by: Antonieta Iba on: 01/03/2021 12:59 PM   Modules accepted: Orders

## 2021-01-03 NOTE — Progress Notes (Signed)
Cardiology Office Note    Date:  01/03/2021   ID:  Evan Rivera, DOB 1960-09-19, MRN [id number]  PCP:  Pleas Koch, NP  Cardiologist: Dr. Radford Pax  Chief Complaint: CAD, HTN, HLD  History of Present Illness:   Evan Rivera is a 60 y.o. male with hx of HTN and HLD who  recently presented to hospital for chest pain and ruled in for NSTEMI. Cath showed multivessel CAD and underwent CABG x 4. He was started on Imdur for his radial artery harvest.  Hospital course complicated by R sided pneumothorax with improvement on repeat chest x-ray.   He is here today for followup and is doing well.  He denies any chest pain or pressure, SOB, DOE, PND, orthopnea, LE edema, dizziness, palpitations or syncope. He is compliant with his meds and is tolerating meds with no SE.    He is concerned that his BP is not adequately controlled recently.   Past Medical History:  Diagnosis Date   Allergy    seasonal   Aortic insufficiency    mild by echo 12/2020   CAD (coronary artery disease) of bypass graft    s/p CABG x 4   GERD (gastroesophageal reflux disease)    Hyperlipidemia    no meds    Hypertension     Past Surgical History:  Procedure Laterality Date   COLONOSCOPY     > 10 yeras ago- normal per pt.    CORONARY ARTERY BYPASS GRAFT N/A 09/27/2019   Procedure: CORONARY ARTERY BYPASS GRAFTING (CABG), ON PUMP, TIMES 4, USING LEFT INTERNAL MAMMARY ARTERY, LEFT RADIAL ARTERY, AND ENDOSCOPICALLY HARVESTED RIGHT GREATER SAPHENOUS VEIN. LIMA to LAD, LEFT RADIAL ARTERY to OM1, SVG to DIAG 1, SVG to PLB;  Surgeon: Grace Isaac, MD;  Location: [place];  Service: Open Heart Surgery;  Laterality: N/A;   ENDOVEIN HARVEST OF GREATER SAPHENOUS VEIN Right 09/27/2019   Procedure: ENDOVEIN HARVEST OF GREATER SAPHENOUS VEIN;  Surgeon: Grace Isaac, MD;  Location: [facility];  Service: Open Heart Surgery;  Laterality: Right;   HERNIA REPAIR     umbilical    LEFT HEART CATH AND CORONARY ANGIOGRAPHY N/A  09/26/2019   Procedure: LEFT HEART CATH AND CORONARY ANGIOGRAPHY;  Surgeon: Martinique, Peter M, MD;  Location: [facility] [facility];  Service: Cardiovascular;  Laterality: N/A;   RADIAL ARTERY HARVEST Left 09/27/2019   Procedure: RADIAL ARTERY HARVEST;  Surgeon: Grace Isaac, MD;  Location: [place];  Service: Open Heart Surgery;  Laterality: Left;   RESECTION OF MEDIASTINAL MASS N/A 09/27/2019   Procedure: RESECTION OF INCIDENTAL ANTERIOR  MEDIASTINAL MASS;  Surgeon: Grace Isaac, MD;  Location: [place];  Service: Open Heart Surgery;  Laterality: N/A;   TEE WITHOUT CARDIOVERSION N/A 09/27/2019   Procedure: TRANSESOPHAGEAL ECHOCARDIOGRAM (TEE);  Surgeon: Grace Isaac, MD;  Location: [place];  Service: Open Heart Surgery;  Laterality: N/A;   TONSILLECTOMY      Current Medications: Prior to Admission medications   Medication Sig Start Date End Date Taking? Authorizing Provider  acetaminophen (TYLENOL) 500 MG tablet Take 2 tablets (1,000 mg total) by mouth every 6 (six) hours as needed for mild pain, fever or headache. 10/01/19   Barrett, Lodema Hong, PA-C  Ascorbic Acid (VITAMIN C) 1000 MG tablet Take 2,000 mg by mouth at bedtime.    [provider]  aspirin 81 MG chewable tablet Chew 1 tablet (81 mg total) by mouth daily. 10/01/19   Barrett, Junie Panning  R, PA-C  cholecalciferol (VITAMIN D) 25 MCG (1000 UNIT) tablet Take 2,000 Units by mouth at bedtime.    [provider]  clopidogrel (PLAVIX) 75 MG tablet Take 1 tablet (75 mg total) by mouth daily. 10/01/19   Barrett, Erin R, PA-C  Evolocumab (REPATHA SURECLICK) 626 MG/ML SOAJ Inject 140 mg into the skin every 14 (fourteen) days. 10/12/19   Leanor Kail, PA  ezetimibe (ZETIA) 10 MG tablet Take 1 tablet (10 mg total) by mouth at bedtime. 10/01/19   Barrett, Erin R, PA-C  isosorbide mononitrate (IMDUR) 30 MG 24 hr tablet Take 1 tablet (30 mg total) by mouth daily. 10/01/19   Barrett, Erin R, PA-C  metoprolol tartrate (LOPRESSOR) 25 MG  tablet Take 1 tablet (25 mg total) by mouth 2 (two) times daily. 10/01/19   Barrett, Erin R, PA-C  Multiple Vitamins-Minerals (ZINC PO) Take 1 tablet by mouth at bedtime.    [provider]  oxyCODONE (OXY IR/ROXICODONE) 5 MG immediate release tablet Take 1 tablet (5 mg total) by mouth every 4 (four) hours as needed for severe pain. 10/01/19   Barrett, Lodema Hong, PA-C  promethazine (PHENERGAN) 25 MG suppository Place 1 suppository (25 mg total) rectally every 6 (six) hours as needed for nausea. 10/02/19 10/01/20  Wonda Olds, MD    Allergies:   Statins and Drug [tape]   Social History   Socioeconomic History   Marital status: Married    Spouse name: Not on file   Number of children: Not on file   Years of education: Not on file   Highest education level: Not on file  Occupational History   Not on file  Tobacco Use   Smoking status: Former    Types: Cigarettes    Quit date: 06/23/1989    Years since quitting: 31.5   Smokeless tobacco: Former  Substance and Sexual Activity   Alcohol use: No   Drug use: No   Sexual activity: Not on file  Other Topics Concern   Not on file  Social History Narrative   Married.   2 children.   Works as a Administrator.   Enjoys kayaking.    [employer] years.     Social Determinants of Health   Financial Resource Strain: Not on file  Food Insecurity: Not on file  Transportation Needs: Not on file  Physical Activity: Not on file  Stress: Not on file  Social Connections: Not on file     Family History:  The patient's family history includes Heart disease in his mother; Hyperlipidemia in his father; Hypertension in his father; Prostate cancer in his father.   ROS:   Please see the history of present illness.    ROS All other systems reviewed and are negative.   PHYSICAL EXAM:   VS:  BP (!) 142/98 (BP Location: Right Arm, Patient Position: Sitting, Cuff Size: Normal)   Pulse 84   Ht 5\' 6"  (1.676 m)   Wt 166 lb (75.3 kg)   SpO2 97%    BMI 26.79 kg/m    GEN: Well nourished, well developed in no acute distress HEENT: Normal NECK: No JVD; No carotid bruits LYMPHATICS: No lymphadenopathy CARDIAC:RRR, no murmurs, rubs, gallops RESPIRATORY:  Clear to auscultation without rales, wheezing or rhonchi  ABDOMEN: Soft, non-tender, non-distended MUSCULOSKELETAL:  No edema; No deformity  SKIN: Warm and dry NEUROLOGIC:  Alert and oriented x 3 PSYCHIATRIC:  Normal affect   Wt Readings from Last 3 Encounters:  01/03/21 166  lb (75.3 kg)  05/17/20 165 lb (74.8 kg)  12/19/19 156 lb (70.8 kg)      Studies/Labs Reviewed:   EKG:  EKG is not ordered today  Recent Labs: 08/15/2020: BUN 17; Creatinine, Ser 0.81; Hemoglobin 14.5; Platelets 356; Potassium 4.1; Sodium 138   Lipid Panel    Component Value Date/Time   CHOL 101 11/17/2019 0746   TRIG 179 (H) 11/17/2019 0746   HDL 39 (L) 11/17/2019 0746   CHOLHDL 2.6 11/17/2019 0746   CHOLHDL 4.8 09/26/2019 0655   VLDL 33 09/26/2019 0655   LDLCALC 33 11/17/2019 0746   LDLDIRECT 97.0 09/13/2019 1526    Additional studies/ records that were reviewed today include:   CABG 09/27/19 LIMA->LAD SVG->D1 L. Radial Artery->OM SVG->PLB of RCA  Echocardiogram: 09/26/19 1. Left ventricular ejection fraction, by estimation, is 55 to 60%. The  left ventricle has normal function. The left ventricle has no regional  wall motion abnormalities. Left ventricular diastolic parameters were  normal.   2. Right ventricular systolic function is normal. The right ventricular  size is normal. Tricuspid regurgitation signal is inadequate for assessing  PA pressure.   3. The mitral valve is grossly normal. Trivial mitral valve  regurgitation. No evidence of mitral stenosis.   4. The aortic valve is tricuspid. Aortic valve regurgitation is trivial.  No aortic stenosis is present.   5. The inferior vena cava is dilated in size with <50% respiratory  variability, suggesting right atrial pressure of 15  mmHg.   Cardiac Catheterization: 09/26/19 LEFT HEART CATH AND CORONARY ANGIOGRAPHY  Conclusion    Prox LAD lesion is 70% stenosed. Mid LAD lesion is 80% stenosed. 2nd Diag lesion is 90% stenosed. Ost Cx to Prox Cx lesion is 90% stenosed. 1st Mrg lesion is 70% stenosed. Prox RCA lesion is 85% stenosed. Prox RCA to Mid RCA lesion is 100% stenosed. There is mild left ventricular systolic dysfunction. LV end diastolic pressure is mildly elevated. The left ventricular ejection fraction is 50-55% by visual estimate.   1. Severe 3 vessel obstructive CAD 2. Mild LV impairment. EF 50% with global HK. 3. Mildly elevated LVEDP   Plan: CT surgery consultation for CABG.  ASSESSMENT & PLAN:   1. CAD s/p CABG x 4 -he denies any anginal symptoms since I saw him last -continue prescription drug management with ASA 81mg  daily, Plavix 75mg  daily with PRN refills -increasing Lopressor to 50mg  BID for better BP control  2. HTN -BP is elevated on exam today -continue prescription drug management but increase Lopressor to 50mg  BID for better control -Pharm D HTN clinic in 2 weeks  3. HLD - Hx of statin intolerance - check FLP and ALT -continue prescription drug management with Zetia 10mg  daily and Repatha with PRN refils   Medication Adjustments/Labs and Tests Ordered: Current medicines are reviewed at length with the patient today.  Concerns regarding medicines are outlined above.  Medication changes, Labs and Tests ordered today are listed in the Patient Instructions below. There are no Patient Instructions on file for this visit.    Signed, Fransico Him, MD  01/03/2021 12:47 PM    [address], [place], [place]  [address] Phone: [phone]; Fax: [phone]

## 2021-01-03 NOTE — Patient Instructions (Signed)
Medication Instructions:  Your physician has recommended you make the following change in your medication: 1) INCREASE Lopressor to 50 mg twice daily.   *If you need a refill on your cardiac medications before your next appointment, please call your pharmacy*  Lab Work: TODAY: FLP and ALT If you have labs (blood work) drawn today and your tests are completely normal, you will receive your results only by: [place] (if you have MyChart) OR A paper copy in the mail If you have any lab test that is abnormal or we need to change your treatment, we will call you to review the results.  Follow-Up: At [facility], you and your health needs are our priority.  As part of our continuing mission to provide you with exceptional heart care, we have created designated Provider Care Teams.  These Care Teams include your primary Cardiologist (physician) and Advanced Practice Providers (APPs -  Physician Assistants and Nurse Practitioners) who all work together to provide you with the care you need, when you need it.  Your next appointment:   1 year(s)  The format for your next appointment:   In Person  Provider:   You may see Fransico Him, MD or one of the following Advanced Practice Providers on your designated Care Team:   Melina Copa, PA-C Ermalinda Barrios, PA-C  Other Instructions You have been referred to see our PharmD in the Hypertension [facility] in two weeks

## 2021-01-04 LAB — LIPID PANEL
Chol/HDL Ratio: 3.9 ratio (ref 0.0–5.0)
Cholesterol, Total: 124 mg/dL (ref 100–199)
HDL: 32 mg/dL — ABNORMAL LOW (ref >39)
LDL Chol Calc (NIH): 37 mg/dL (ref 0–99)
Triglycerides: 379 mg/dL — ABNORMAL HIGH (ref 0–149)
VLDL Cholesterol Cal: 55 mg/dL — ABNORMAL HIGH (ref 5–40)

## 2021-01-04 LAB — ALT: ALT: 46 IU/L — ABNORMAL HIGH (ref 0–44)

## 2021-01-17 ENCOUNTER — Ambulatory Visit

## 2021-01-28 ENCOUNTER — Other Ambulatory Visit: Payer: Self-pay

## 2021-01-28 MED ORDER — CLOPIDOGREL BISULFATE 75 MG PO TABS: 75.0000 mg | ORAL_TABLET | Freq: Every day | ORAL | 3 refills | Status: DC

## 2021-01-31 ENCOUNTER — Ambulatory Visit

## 2021-03-20 ENCOUNTER — Other Ambulatory Visit: Payer: Self-pay | Admitting: Cardiology

## 2021-03-20 DIAGNOSIS — E785 Hyperlipidemia, unspecified: Secondary | ICD-10-CM

## 2021-04-16 ENCOUNTER — Ambulatory Visit: Admitting: Family

## 2021-04-16 ENCOUNTER — Encounter: Payer: Self-pay | Admitting: Family

## 2021-04-16 ENCOUNTER — Other Ambulatory Visit: Payer: Self-pay

## 2021-04-16 VITALS — BP 148/76 | HR 82 | Ht 65.0 in | Wt 170.0 lb

## 2021-04-16 DIAGNOSIS — R052 Subacute cough: Secondary | ICD-10-CM

## 2021-04-16 DIAGNOSIS — K219 Gastro-esophageal reflux disease without esophagitis: Secondary | ICD-10-CM

## 2021-04-16 DIAGNOSIS — R051 Acute cough: Secondary | ICD-10-CM | POA: Insufficient documentation

## 2021-04-16 DIAGNOSIS — J309 Allergic rhinitis, unspecified: Secondary | ICD-10-CM

## 2021-04-16 MED ORDER — FLUTICASONE PROPIONATE HFA 110 MCG/ACT IN AERO: 2.0000 | INHALATION_SPRAY | Freq: Two times a day (BID) | RESPIRATORY_TRACT | 1 refills | Status: DC

## 2021-04-16 MED ORDER — FLUTICASONE PROPIONATE 50 MCG/ACT NA SUSP: 2.0000 | Freq: Every day | NASAL | 6 refills | Status: DC

## 2021-04-16 MED ORDER — PANTOPRAZOLE SODIUM 20 MG PO TBEC: 20.0000 mg | DELAYED_RELEASE_TABLET | Freq: Every day | ORAL | 5 refills | Status: DC

## 2021-04-16 NOTE — Patient Instructions (Signed)
I recommend that you start Flonase once daily that is sent to pharmacy as well as continue with the Claritin once daily.  I have also sent a prescription to your pharmacy for an inhaler that you will take once daily.  Your albuterol inhaler you already have is only for emergency use when you are having shortness of breath.  Hopefully we will just see a decline in needing to have to use the emergency inhaler.  Make sure you wash your mouth out with water after every time you use the inhaler.  I want you to stop your omeprazole and start your pantoprazole that I sent to the pharmacy as well for your heartburn symptoms.  If there is no improvement with these changes I do want you to see pulmonary and we can order a referral.  Please just let me know.  Can  It was a pleasure seeing you today! Please do not hesitate to reach out with any questions and or concerns.  Regards,   Eugenia Pancoast FNP-C

## 2021-04-16 NOTE — Assessment & Plan Note (Signed)
D/c omeprazole sent rx pantoprazole to pharmacy. Try to decrease and or avoid spicy foods, fried fatty foods, and also caffeine and chocolate as these can increase heartburn symptoms.

## 2021-04-16 NOTE — Progress Notes (Signed)
Established Patient Office Visit  Subjective:  Patient ID: Evan Rivera, male    DOB: 1960/04/13  Age: 61 y.o. MRN: [id number]  CC:  Chief Complaint  Patient presents with   Cough    Pt stated--ongoing cough with clear mucus--2 months--Denied fever. Tested negative for COVID.    HPI Evan Rivera is here today with concerns.   Was sick in December, and ever since then seems to be stuck a cough that that is occasionally dry and at other times brings up clear sputum. Denies chest tightness and or congestion. Does not really get short of breath. Cough is worse with eating, lying down and or at morning and at night. During the day its typically better.   Occasional sneezing. Did start taking claritin D five days ago with some relief. He does work with concrete dust which probably doesn't help anything.he does have heart burn if he misses an omeprazole 20 mg once daily that he takes and buys over the counter.    No other URI symptoms to include sore throat, bil ear pain, and or nasal congestion.   03/27/21 went to [facility] [facility] and was treated with levaquin one pill once daily for 7 days, prednisone taper as well as given a nebulizer treatment in office and also albuterol prn, which he states using at least once if not twice daily. CXR per report was normal. Prior to that was seen in December   12/7 initially went to [facility] and was treated with augmentin with prednisone and cough syrup with temporary relief.   Past Medical History:  Diagnosis Date   Allergy    seasonal   Aortic insufficiency    mild by echo 12/2020   CAD (coronary artery disease) of bypass graft    s/p CABG x 4   GERD (gastroesophageal reflux disease)    Hyperlipidemia    no meds    Hypertension     Past Surgical History:  Procedure Laterality Date   COLONOSCOPY     > 10 yeras ago- normal per pt.    CORONARY ARTERY BYPASS GRAFT N/A 09/27/2019   Procedure: CORONARY ARTERY BYPASS  GRAFTING (CABG), ON PUMP, TIMES 4, USING LEFT INTERNAL MAMMARY ARTERY, LEFT RADIAL ARTERY, AND ENDOSCOPICALLY HARVESTED RIGHT GREATER SAPHENOUS VEIN. LIMA to LAD, LEFT RADIAL ARTERY to OM1, SVG to DIAG 1, SVG to PLB;  Surgeon: Grace Isaac, MD;  Location: [place];  Service: Open Heart Surgery;  Laterality: N/A;   ENDOVEIN HARVEST OF GREATER SAPHENOUS VEIN Right 09/27/2019   Procedure: ENDOVEIN HARVEST OF GREATER SAPHENOUS VEIN;  Surgeon: Grace Isaac, MD;  Location: [place];  Service: Open Heart Surgery;  Laterality: Right;   HERNIA REPAIR     umbilical    LEFT HEART CATH AND CORONARY ANGIOGRAPHY N/A 09/26/2019   Procedure: LEFT HEART CATH AND CORONARY ANGIOGRAPHY;  Surgeon: Martinique, Peter M, MD;  Location: [place] [facility];  Service: Cardiovascular;  Laterality: N/A;   RADIAL ARTERY HARVEST Left 09/27/2019   Procedure: RADIAL ARTERY HARVEST;  Surgeon: Grace Isaac, MD;  Location: [place];  Service: Open Heart Surgery;  Laterality: Left;   RESECTION OF MEDIASTINAL MASS N/A 09/27/2019   Procedure: RESECTION OF INCIDENTAL ANTERIOR  MEDIASTINAL MASS;  Surgeon: Grace Isaac, MD;  Location: [place];  Service: Open Heart Surgery;  Laterality: N/A;   TEE WITHOUT CARDIOVERSION N/A 09/27/2019   Procedure: TRANSESOPHAGEAL ECHOCARDIOGRAM (TEE);  Surgeon: Grace Isaac, MD;  Location: [facility];  Service: Open Heart Surgery;  Laterality: N/A;   TONSILLECTOMY      Family History  Problem Relation Age of Onset   Prostate cancer Father        late 64's   Hyperlipidemia Father    Hypertension Father    Heart disease Mother    Colon cancer Neg Hx    Colon polyps Neg Hx    Esophageal cancer Neg Hx    Rectal cancer Neg Hx    Stomach cancer Neg Hx     Social History   Socioeconomic History   Marital status: Married    Spouse name: Not on file   Number of children: Not on file   Years of education: Not on file   Highest education level: Not on file  Occupational History   Not on file   Tobacco Use   Smoking status: Former    Types: Cigarettes    Quit date: 06/23/1989    Years since quitting: 31.8   Smokeless tobacco: Former  Substance and Sexual Activity   Alcohol use: No   Drug use: No   Sexual activity: Not on file  Other Topics Concern   Not on file  Social History Narrative   Married.   2 children.   Works as a Administrator.   Enjoys kayaking.    [employer] years.     Social Determinants of Health   Financial Resource Strain: Not on file  Food Insecurity: Not on file  Transportation Needs: Not on file  Physical Activity: Not on file  Stress: Not on file  Social Connections: Not on file  Intimate Partner Violence: Not on file    Outpatient Medications Prior to Visit  Medication Sig Dispense Refill   acetaminophen (TYLENOL) 500 MG tablet Take 2 tablets (1,000 mg total) by mouth every 6 (six) hours as needed for mild pain, fever or headache. 30 tablet 0   Ascorbic Acid (VITAMIN C) 1000 MG tablet Take 2,000 mg by mouth at bedtime.     aspirin 81 MG chewable tablet Chew 1 tablet (81 mg total) by mouth daily.     cholecalciferol (VITAMIN D) 25 MCG (1000 UNIT) tablet Take 2,000 Units by mouth at bedtime.     clopidogrel (PLAVIX) 75 MG tablet Take 1 tablet (75 mg total) by mouth daily. 90 tablet 3   ezetimibe (ZETIA) 10 MG tablet Take 1 tablet (10 mg total) by mouth at bedtime. 90 tablet 3   Garlic 694 MG TABS Take by mouth daily.     metoprolol tartrate (LOPRESSOR) 50 MG tablet Take 1 tablet (50 mg total) by mouth 2 (two) times daily. 180 tablet 3   REPATHA SURECLICK 854 MG/ML SOAJ INJECT 140 MG UNDER THE SKIN EVERY 14 DAYS 6 mL 3   nitroGLYCERIN (NITROSTAT) 0.4 MG SL tablet Place 1 tablet (0.4 mg total) under the tongue every 5 (five) minutes as needed. 25 tablet 3   benzonatate (TESSALON) 100 MG capsule Take 1 capsule (100 mg total) by mouth every 8 (eight) hours. (Patient not taking: Reported on 01/03/2021) 21 capsule 0   Multiple Vitamins-Minerals (ZINC  PO) Take 1 tablet by mouth at bedtime. (Patient not taking: Reported on 01/03/2021)     predniSONE (STERAPRED UNI-PAK 21 TAB) 10 MG (21) TBPK tablet Take by mouth daily. Take 6 tabs by mouth daily  for 2 days, then 5 tabs for 2 days, then 4 tabs for 2 days, then 3 tabs for 2  days, 2 tabs for 2 days, then 1 tab by mouth daily for 2 days (Patient not taking: Reported on 01/03/2021) 42 tablet 0   vitamin E 1000 UNIT capsule Take 1,000 Units by mouth daily.     No facility-administered medications prior to visit.    Allergies  Allergen Reactions   Statins Anaphylaxis    Pt does not remember which statin caused the reaction   Drug [Tape] Hives    Reaction to some forms of bandaids - please use paper tape    ROS Review of Systems  Constitutional:  Negative for chills and fever.  HENT:  Positive for sneezing. Negative for congestion, ear pain, sinus pain and sore throat.   Respiratory:  Positive for cough and shortness of breath (using albuterol). Negative for wheezing.   Cardiovascular:  Negative for chest pain and palpitations.  Gastrointestinal:  Negative for abdominal pain.       Heartburn at times      Objective:    Physical Exam Constitutional:      General: He is not in acute distress.    Appearance: Normal appearance. He is normal weight. He is not ill-appearing, toxic-appearing or diaphoretic.  HENT:     Head: Normocephalic.     Right Ear: Tympanic membrane normal.     Left Ear: Tympanic membrane normal.     Nose: Nose normal.     Right Turbinates: Swollen.     Left Turbinates: Swollen.     Right Sinus: No maxillary sinus tenderness or frontal sinus tenderness.     Left Sinus: No maxillary sinus tenderness or frontal sinus tenderness.     Mouth/Throat:     Mouth: Mucous membranes are moist.  Eyes:     Pupils: Pupils are equal, round, and reactive to light.  Cardiovascular:     Rate and Rhythm: Normal rate and regular rhythm.  Pulmonary:     Effort: Pulmonary effort is  normal.     Breath sounds: Normal breath sounds.  Abdominal:     General: Abdomen is flat.  Neurological:     Mental Status: He is alert.    BP (!) 148/76    Pulse 82    Ht 5\' 5"  (1.651 m)    Wt 170 lb (77.1 kg)    SpO2 97%    BMI 28.29 kg/m  Wt Readings from Last 3 Encounters:  04/16/21 170 lb (77.1 kg)  01/03/21 166 lb (75.3 kg)  05/17/20 165 lb (74.8 kg)     Health Maintenance Due  Topic Date Due   Hepatitis C Screening  Never done   TETANUS/TDAP  Never done   Zoster Vaccines- Shingrix (1 of 2) Never done   COVID-19 Vaccine (3 - Booster for Moderna series) 07/30/2019   INFLUENZA VACCINE  Never done    There are no preventive care reminders to display for this patient.  Lab Results  Component Value Date   TSH 1.054 09/26/2019   Lab Results  Component Value Date   WBC 5.5 08/15/2020   HGB 14.5 08/15/2020   HCT 43.5 08/15/2020   MCV 92.4 08/15/2020   PLT 356 08/15/2020   Lab Results  Component Value Date   NA 138 08/15/2020   K 4.1 08/15/2020   CO2 24 08/15/2020   GLUCOSE 86 08/15/2020   BUN 17 08/15/2020   CREATININE 0.81 08/15/2020   BILITOT 0.3 11/17/2019   ALKPHOS 88 11/17/2019   AST 19 11/17/2019   ALT 46 (H) 01/03/2021   PROT  7.3 11/17/2019   ALBUMIN 4.8 11/17/2019   CALCIUM 9.5 08/15/2020   ANIONGAP 10 08/15/2020   GFR 93.29 02/18/2019   Lab Results  Component Value Date   HGBA1C 5.6 09/26/2019      Assessment & Plan:   Problem List Items Addressed This Visit       Respiratory   Allergic rhinitis    Continue claritin daily add flonase daily.       Relevant Medications   fluticasone (FLOVENT HFA) 110 MCG/ACT inhaler   fluticasone (FLONASE) 50 MCG/ACT nasal spray     Digestive   GERD (gastroesophageal reflux disease) - Primary    D/c omeprazole sent rx pantoprazole to pharmacy. Try to decrease and or avoid spicy foods, fried fatty foods, and also caffeine and chocolate as these can increase heartburn symptoms.        Relevant  Medications   pantoprazole (PROTONIX) 20 MG tablet     Other   Subacute cough    Trial addition of Flonase as well as Flovent sent hopefully patient will no longer ED to use the emergency inhaler as much.  Patient to follow-up if no improvement in symptoms and we will refer to pulmonary for further evaluation.  I will change his PPI as he has been on this longer term and still with heartburn.  New rx pantoprazole sent to the pharmacy which may also help with his cough.      Relevant Medications   fluticasone (FLOVENT HFA) 110 MCG/ACT inhaler    Meds ordered this encounter  Medications   pantoprazole (PROTONIX) 20 MG tablet    Sig: Take 1 tablet (20 mg total) by mouth daily.    Dispense:  30 tablet    Refill:  5    Order Specific Question:   Supervising Provider    Answer:   BEDSOLE, AMY E [[id number]]   fluticasone (FLOVENT HFA) 110 MCG/ACT inhaler    Sig: Inhale 2 puffs into the lungs in the morning and at bedtime.    Dispense:  1 each    Refill:  1    Order Specific Question:   Supervising Provider    Answer:   BEDSOLE, AMY E [[id number]]   fluticasone (FLONASE) 50 MCG/ACT nasal spray    Sig: Place 2 sprays into both nostrils daily.    Dispense:  16 g    Refill:  6    Order Specific Question:   Supervising Provider    Answer:   Diona Browner, AMY E [[id number]]    Follow-up: Return in about 3 weeks (around 05/07/2021) for for follow up .    Eugenia Pancoast, FNP

## 2021-04-16 NOTE — Assessment & Plan Note (Signed)
Trial addition of Flonase as well as Flovent sent hopefully patient will no longer ED to use the emergency inhaler as much.  Patient to follow-up if no improvement in symptoms and we will refer to pulmonary for further evaluation.  I will change his PPI as he has been on this longer term and still with heartburn.  New rx pantoprazole sent to the pharmacy which may also help with his cough.

## 2021-04-16 NOTE — Assessment & Plan Note (Signed)
Continue claritin daily add flonase daily.

## 2021-05-07 ENCOUNTER — Encounter: Payer: Self-pay | Admitting: Primary Care

## 2021-05-07 ENCOUNTER — Ambulatory Visit (INDEPENDENT_AMBULATORY_CARE_PROVIDER_SITE_OTHER): Admitting: Primary Care

## 2021-05-07 ENCOUNTER — Other Ambulatory Visit: Payer: Self-pay

## 2021-05-07 VITALS — BP 134/78 | HR 63 | Temp 97.6°F | Ht 65.0 in | Wt 171.0 lb

## 2021-05-07 DIAGNOSIS — R052 Subacute cough: Secondary | ICD-10-CM | POA: Diagnosis not present

## 2021-05-07 DIAGNOSIS — K219 Gastro-esophageal reflux disease without esophagitis: Secondary | ICD-10-CM | POA: Diagnosis not present

## 2021-05-07 DIAGNOSIS — G8929 Other chronic pain: Secondary | ICD-10-CM

## 2021-05-07 DIAGNOSIS — I10 Essential (primary) hypertension: Secondary | ICD-10-CM | POA: Diagnosis not present

## 2021-05-07 DIAGNOSIS — M25512 Pain in left shoulder: Secondary | ICD-10-CM

## 2021-05-07 DIAGNOSIS — I252 Old myocardial infarction: Secondary | ICD-10-CM

## 2021-05-07 DIAGNOSIS — M25511 Pain in right shoulder: Secondary | ICD-10-CM

## 2021-05-07 DIAGNOSIS — E7800 Pure hypercholesterolemia, unspecified: Secondary | ICD-10-CM

## 2021-05-07 MED ORDER — OMEPRAZOLE 20 MG PO CPDR: 20.0000 mg | DELAYED_RELEASE_CAPSULE | Freq: Every day | ORAL | 3 refills | Status: DC

## 2021-05-07 NOTE — Patient Instructions (Addendum)
Continue the fluticasone (Flovent) inhaler for your cough.  To wean off, reduce down to 1 puff twice daily for 1 to 2 weeks, then stop completely. ? ?I sent a prescription for your omeprazole 20 mg to [facility]. ? ?It was a pleasure to see you today! ? ? ?

## 2021-05-07 NOTE — Progress Notes (Signed)
? ?Subjective:  ? ? Patient ID: Evan Rivera, male    DOB: 26-Oct-1960, 61 y.o.   MRN: [id number] ? ?HPI ? ?Evan Rivera is a very pleasant 61 y.o. male with a history of CAD with NSTEMI, aortic insufficiency, GERD, hyperlipidemia, CABG x4 who presents today for follow-up. ? ?He has not been seen by me since July 2021. ? ?1) CAD/NSTEMI: Diagnosed in July 2021.  Cardiac catheterization showed multivessel CAD so he underwent CABG x4.  Following with cardiology, last visit was in November 2022.  Currently managed on metoprolol tartrate 50 mg twice daily, aspirin 81 mg daily, clopidogrel 75 mg daily, Zetia 10 mg daily, and Repatha every 2 weeks. ? ?Today he endorses feeling well, no chest pain or shortness of breath.  He denies a family history of early CAD.  He would like to come off of several of these medications. ? ?BP Readings from Last 3 Encounters:  ?05/07/21 134/78  ?04/16/21 (!) 148/76  ?01/03/21 (!) 142/98  ? ? ? ?2) Chronic Cough: Chronic since December 2022. ? ?He was last evaluated by Cassandria Santee, NP on 04/16/2021 for a 61-monthwet/dry cough.  During this visit he was prescribed Flovent inhaler, Flonase, pantoprazole 20 mg daily. ? ?He was originally evaluated on 02/06/2021 by [place] tried primary care, treated with Augmentin, prednisone, cough syrup.  [facility] treated with Levaquin, prednisone, nebulizer treatment.  Chest x-ray was negative. ? ?Since his visit with Tabatha his cough has nearly resolved.  He is compliant to his Flovent inhaler BID. He is no longer taking pantoprazole as it was ineffective. He is doing well on omeprazole 20 mg daily which he purchases OTC for chronic GERD.  He denies recent use of his albuterol inhaler.  ? ?3) Chronic Shoulder Pain: Chronic to bilateral shoulders for years, is taking Ibuprofen 800 mg daily most days of the week. His pain is chronic, radiates down bilateral upper extremities.  ? ?He works as a tAdministrator  drives locally for now. Tylenol is ineffective for his pain.  He has an appointment with [facility] next week. Has never been seen by orthopedics, is afraid that he will require surgery. ? ? ?Review of Systems  ?Respiratory:  Negative for shortness of breath.   ?Cardiovascular:  Negative for chest pain.  ?Gastrointestinal:   ?     Chronic GERD  ?Musculoskeletal:  Positive for arthralgias.  ?Neurological:  Positive for numbness.  ? ?   ? ? ?Past Medical History:  ?Diagnosis Date  ? Allergy   ? seasonal  ? Aortic insufficiency   ? mild by echo 12/2020  ? CAD (coronary artery disease) of bypass graft   ? s/p CABG x 4  ? GERD (gastroesophageal reflux disease)   ? Hyperlipidemia   ? no meds   ? Hypertension   ? ? ?Social History  ? ?Socioeconomic History  ? Marital status: Married  ?  Spouse name: Not on file  ? Number of children: Not on file  ? Years of education: Not on file  ? Highest education level: Not on file  ?Occupational History  ? Not on file  ?Tobacco Use  ? Smoking status: Former  ?  Types: Cigarettes  ?  Quit date: 06/23/1989  ?  Years since quitting: 31.8  ? Smokeless tobacco: Former  ?Substance and Sexual Activity  ? Alcohol use: No  ? Drug use: No  ? Sexual activity: Not on file  ?  Other Topics Concern  ? Not on file  ?Social History Narrative  ? Married.  ? 2 children.  ? Works as a Administrator.  ? Enjoys kayaking.   ? [employer] years.    ? ?Social Determinants of Health  ? ?Financial Resource Strain: Not on file  ?Food Insecurity: Not on file  ?Transportation Needs: Not on file  ?Physical Activity: Not on file  ?Stress: Not on file  ?Social Connections: Not on file  ?Intimate Partner Violence: Not on file  ? ? ?Past Surgical History:  ?Procedure Laterality Date  ? COLONOSCOPY    ? > 10 yeras ago- normal per pt.   ? CORONARY ARTERY BYPASS GRAFT N/A 09/27/2019  ? Procedure: CORONARY ARTERY BYPASS GRAFTING (CABG), ON PUMP, TIMES 4, USING LEFT INTERNAL MAMMARY ARTERY, LEFT RADIAL ARTERY, AND ENDOSCOPICALLY  HARVESTED RIGHT GREATER SAPHENOUS VEIN. LIMA to LAD, LEFT RADIAL ARTERY to OM1, SVG to DIAG 1, SVG to PLB;  Surgeon: Grace Isaac, MD;  Location: [place];  Service: Open Heart Surgery;  Laterality: N/A;  ? ENDOVEIN HARVEST OF GREATER SAPHENOUS VEIN Right 09/27/2019  ? Procedure: ENDOVEIN HARVEST OF GREATER SAPHENOUS VEIN;  Surgeon: Grace Isaac, MD;  Location: [place];  Service: Open Heart Surgery;  Laterality: Right;  ? HERNIA REPAIR    ? umbilical   ? LEFT HEART CATH AND CORONARY ANGIOGRAPHY N/A 09/26/2019  ? Procedure: LEFT HEART CATH AND CORONARY ANGIOGRAPHY;  Surgeon: Martinique, Peter M, MD;  Location: [place] [facility];  Service: Cardiovascular;  Laterality: N/A;  ? RADIAL ARTERY HARVEST Left 09/27/2019  ? Procedure: RADIAL ARTERY HARVEST;  Surgeon: Grace Isaac, MD;  Location: [place];  Service: Open Heart Surgery;  Laterality: Left;  ? RESECTION OF MEDIASTINAL MASS N/A 09/27/2019  ? Procedure: RESECTION OF INCIDENTAL ANTERIOR  MEDIASTINAL MASS;  Surgeon: Grace Isaac, MD;  Location: [place];  Service: Open Heart Surgery;  Laterality: N/A;  ? TEE WITHOUT CARDIOVERSION N/A 09/27/2019  ? Procedure: TRANSESOPHAGEAL ECHOCARDIOGRAM (TEE);  Surgeon: Grace Isaac, MD;  Location: [place];  Service: Open Heart Surgery;  Laterality: N/A;  ? TONSILLECTOMY    ? ? ?Family History  ?Problem Relation Age of Onset  ? Prostate cancer Father   ?     late 72's  ? Hyperlipidemia Father   ? Hypertension Father   ? Heart disease Mother   ? Colon cancer Neg Hx   ? Colon polyps Neg Hx   ? Esophageal cancer Neg Hx   ? Rectal cancer Neg Hx   ? Stomach cancer Neg Hx   ? ? ?Allergies  ?Allergen Reactions  ? Statins Anaphylaxis  ?  Pt does not remember which statin caused the reaction  ? Drug [Tape] Hives  ?  Reaction to some forms of bandaids - please use paper tape  ? ? ?Current Outpatient Medications on File Prior to Visit  ?Medication Sig Dispense Refill  ? acetaminophen (TYLENOL) 500 MG tablet Take 2 tablets (1,000 mg  total) by mouth every 6 (six) hours as needed for mild pain, fever or headache. 30 tablet 0  ? Ascorbic Acid (VITAMIN C) 1000 MG tablet Take 2,000 mg by mouth at bedtime.    ? aspirin 81 MG chewable tablet Chew 1 tablet (81 mg total) by mouth daily.    ? cholecalciferol (VITAMIN D) 25 MCG (1000 UNIT) tablet Take 2,000 Units by mouth at bedtime.    ? clopidogrel (PLAVIX) 75 MG tablet Take 1 tablet (  75 mg total) by mouth daily. 90 tablet 3  ? ezetimibe (ZETIA) 10 MG tablet Take 1 tablet (10 mg total) by mouth at bedtime. 90 tablet 3  ? fluticasone (FLONASE) 50 MCG/ACT nasal spray Place 2 sprays into both nostrils daily. 16 g 6  ? fluticasone (FLOVENT HFA) 110 MCG/ACT inhaler Inhale 2 puffs into the lungs in the morning and at bedtime. 1 each 1  ? Garlic 076 MG TABS Take by mouth daily.    ? metoprolol tartrate (LOPRESSOR) 50 MG tablet Take 1 tablet (50 mg total) by mouth 2 (two) times daily. 180 tablet 3  ? REPATHA SURECLICK 226 MG/ML SOAJ INJECT 140 MG UNDER THE SKIN EVERY 14 DAYS 6 mL 3  ? pantoprazole (PROTONIX) 20 MG tablet Take 1 tablet (20 mg total) by mouth daily. (Patient not taking: Reported on 05/07/2021) 30 tablet 5  ? ?No current facility-administered medications on file prior to visit.  ? ? ?BP 134/78   Pulse 63   Temp 97.6 ?F (36.4 ?C) (Oral)   Ht '5\' 5"'$  (1.651 m)   Wt 171 lb (77.6 kg)   SpO2 97%   BMI 28.46 kg/m?  ?Objective:  ? Physical Exam ?Cardiovascular:  ?   Rate and Rhythm: Normal rate and regular rhythm.  ?Pulmonary:  ?   Effort: Pulmonary effort is normal.  ?   Breath sounds: Normal breath sounds. No wheezing or rales.  ?Musculoskeletal:  ?   Cervical back: Neck supple.  ?Skin: ?   General: Skin is warm and dry.  ?Neurological:  ?   Mental Status: He is alert and oriented to person, place, and time.  ?Psychiatric:     ?   Mood and Affect: Mood normal.  ? ? ? ? ? ?   ?Assessment & Plan:  ? ? ? ? ?This visit occurred during the SARS-CoV-2 public health emergency.  Safety protocols were in  place, including screening questions prior to the visit, additional usage of staff PPE, and extensive cleaning of exam room while observing appropriate contact time as indicated for disinfecting solutions.  ?

## 2021-05-07 NOTE — Assessment & Plan Note (Signed)
Statin intolerant. ? ?Reviewed lipid panel from cardiology in November 2022. ?Continue Zetia 10 mg daily, Repatha every 2 weeks. ?

## 2021-05-07 NOTE — Assessment & Plan Note (Signed)
Controlled.   Continue omeprazole 20 mg daily. 

## 2021-05-07 NOTE — Assessment & Plan Note (Signed)
With multivessel CAD and subsequent CABG x4. ?Following with cardiology, office notes from November 2022 reviewed. ? ?Continue aspirin 81 mg daily, clopidogrel 75 mg daily, Zetia 10 mg daily, metoprolol tartrate 50 mg twice daily, Repatha every 2 weeks. ?

## 2021-05-07 NOTE — Assessment & Plan Note (Signed)
Controlled. ? ?Continue metoprolol tartrate 50 mg twice daily. ?

## 2021-05-07 NOTE — Assessment & Plan Note (Signed)
Likely combination of arthritis with bursitis. ? ?He declines orthopedic evaluation as he will be seeing [facility] next week. ? ?He will notify if he changes his mind. ?

## 2021-05-07 NOTE — Assessment & Plan Note (Signed)
Improving. ? ?Continue Flovent 2 puffs twice daily for the next several weeks, then will wean off.  We discussed weaning instructions. ? ?Continue omeprazole 20 mg daily. ?

## 2021-05-21 ENCOUNTER — Telehealth: Payer: Self-pay

## 2021-05-21 NOTE — Telephone Encounter (Signed)
Sueanne Margarita, MD  Antonieta Iba, RN ? ?Please let him know that he needs to stop omeprazole due to interaction with Plavix.  If he has gerd then should be on Protonix '40mg'$  daily   ?  ?   ?Previous Messages ? ? ? ?

## 2021-05-21 NOTE — Telephone Encounter (Signed)
Left message for patient to call back  

## 2021-05-23 NOTE — Telephone Encounter (Signed)
Spoke with the patient who states that he was previously on pantoprazole and it did not work for his heartburn. He reports that he has to be on something to for his heartburn.  ?He would like to know if he really still needs to be on the Plavix or if there is another medication that he can take for GERD.  ?

## 2021-05-24 NOTE — Telephone Encounter (Signed)
Spoke with the patient and advised him that Dr. Radford Pax was okay with him discontinuing Plavix. Patient verbalized understanding.  ?

## 2021-06-18 ENCOUNTER — Ambulatory Visit

## 2021-06-18 VITALS — BP 118/78 | HR 64

## 2021-06-18 DIAGNOSIS — I10 Essential (primary) hypertension: Secondary | ICD-10-CM

## 2021-06-18 NOTE — Patient Instructions (Addendum)
CONTINUE to measure your blood pressure at home. Your goal is less than 130/80. ?CONTINUE taking metoprolol tartrate 50 mg twice daily ?Purchase your own cuff on [employer]it  ?Your cuff size is 33 inches ?Omron Upper Arm Blood Pressure Monitor, 3 Series ?Try to incorporate more exercise - maybe a walk or two on the weekends at [facility]. ?

## 2021-06-18 NOTE — Progress Notes (Signed)
Patient ID: Evan Rivera                 DOB: 06/10/1960                      MRN: [id number] ? ? ? ? ?HPI: ?Evan Rivera is a 61 y.o. male referred by Dr. Radford Pax to HTN clinic. PMH is significant for HLD, HTN, NSTEMI s/p CABG x4, prior tobacco use.  ? ?Per chart review, patient had been taking valsartan for BP since 2012. In 2018, his BP of 126/80 was at goal on valsartan 320 mg daily. At PCP visit in December of 2020, patient reported not taking valsartan for many months and was working on diet and exercise but BP was elevated in clinic (154/100). Patient was instructed to restart valsartan and was titrated up to 320 mg daily. Admitted in July 2021 for NSTEMI after which his valsartan was discontinued and metoprolol 25 mg BID was added. Seen by Dr. Radford Pax in November 2022 where Lopressor was increased to 50 mg BID.  ? ?Patient presents to PharmD clinic for management of HTN. Tolerates metoprolol 50 mg BID - denies dizziness, headaches, balance issues, swelling. Doesn't remember why his valsartan was stopped after the CABG in 2021 or never restarted but had tolerated it since at least 2008. Takes BP measurements at home with his mother's cuff and gets readings ranging from 140s/90s to 160s/100s. Doesn't rest for 5 minutes before measuring BP. Always takes reading in afternoon after returning from work. Patient drives an 96 wheeler and works at least 5 days a week for up to 14 hours daily. Reports not having the time to exercise since he wakes up at 1:30-2:00a for work and gets back by 4p, eats dinner and goes to bed. Used to be in [employer] where he ran long distances and lifted weights but doesn't have the time anymore. Doesn't drink caffeine, reports eating a few chocolate squares every other day, sometimes more. ? ?Current HTN meds: Metoprolol tartrate 50 mg twice daily ? ?Previously tried: Valsartan 320 mg daily (stopped post CABG and never resumed) ? ?BP goal: <130/80 ? ?Family History: Heart disease -  mother; HLD - father; HLD - father; Prostate cancer - father ? 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 ?Diet: Caffeine-free Diet coke, Decaf coffee ?Snacks on salted pistachios, nuts while driving. No chips or processed foods ?For lunch, eats a pack of nabs, smoked oysters and a diet coke (sometimes a sandwich) ?Desserts - once weekly but eats 1-4 chocolate squares more often (~every other day) ? 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 ?Home BP readings: 148/89, as high as 160/100 ? ?Wt Readings from Last 3 Encounters:  ?05/07/21 171 lb (77.6 kg)  ?04/16/21 170 lb (77.1 kg)  ?01/03/21 166 lb (75.3 kg)  ? ?BP Readings from Last 3 Encounters:  ?05/07/21 134/78  ?04/16/21 (!) 148/76  ?01/03/21 (!) 142/98  ? ?Pulse Readings from Last 3 Encounters:  ?05/07/21 63  ?04/16/21 82  ?01/03/21 84  ? ? ?Renal function: ?CrCl cannot be calculated (Patient's most recent lab result is older than the maximum 21 days allowed.). ? ?Past Medical History:  ?Diagnosis Date  ? Allergy   ?  seasonal  ? Aortic insufficiency   ? mild by echo 12/2020  ? CAD (coronary artery disease) of bypass graft   ? s/p CABG x 4  ? GERD (gastroesophageal reflux disease)   ? Hyperlipidemia   ? no meds   ? Hypertension   ? ? ?Current Outpatient Medications on File Prior to Visit  ?Medication Sig Dispense Refill  ? acetaminophen (TYLENOL) 500 MG tablet Take 2 tablets (1,000 mg total) by mouth every 6 (six) hours as needed for mild pain, fever or headache. 30 tablet 0  ? Ascorbic Acid (VITAMIN C) 1000 MG tablet Take 2,000 mg by mouth at bedtime.    ? aspirin 81 MG chewable tablet Chew 1 tablet (81 mg total) by mouth daily.    ? cholecalciferol (VITAMIN D) 25 MCG (1000 UNIT) tablet Take 2,000 Units by mouth at  bedtime.    ? ezetimibe (ZETIA) 10 MG tablet Take 1 tablet (10 mg total) by mouth at bedtime. 90 tablet 3  ? fluticasone (FLONASE) 50 MCG/ACT nasal spray Place 2 sprays into both nostrils daily. 16 g 6  ? fluticasone (FLOVENT HFA) 110 MCG/ACT inhaler Inhale 2 puffs into the lungs in the morning and at bedtime. 1 each 1  ? Garlic 027 MG TABS Take by mouth daily.    ? metoprolol tartrate (LOPRESSOR) 50 MG tablet Take 1 tablet (50 mg total) by mouth 2 (two) times daily. 180 tablet 3  ? omeprazole (PRILOSEC) 20 MG capsule Take 1 capsule (20 mg total) by mouth daily. For heartburn 90 capsule 3  ? REPATHA SURECLICK 253 MG/ML SOAJ INJECT 140 MG UNDER THE SKIN EVERY 14 DAYS 6 mL 3  ? ?No current facility-administered medications on file prior to visit.  ? ? ?Allergies  ?Allergen Reactions  ? Statins Anaphylaxis  ?  Pt does not remember which statin caused the reaction  ? Drug [Tape] Hives  ?  Reaction to some forms of bandaids - please use paper tape  ? ? ?There were no vitals taken for this visit. ? ? ?Assessment/Plan: ? ?1. Hypertension - BP in clinic today is at goal of <130/80 after increasing to metoprolol tartrate 50 mg twice daily this past fall. Reviewed modifiable risk factors with patient and the importance of exercising to reduce BP. Patient said he knows he should, but doesn't have the time for it right now with his job. Lives by [facility] and said he would try to go on walks with his wife when able. Encouraged him to limit consumption of the salty pistachios and nuts and try trail mix instead. Spoke about getting his own BP cuff on [employer] and measured arm for proper cuff size. Patient agreed to purchase one, start measuring BP at home, and to call if he starts having high readings. Can consider adding back valsartan since he tolerated it in the past and was effective. Scheduled an appointment in one month and instructed patient to call if his BP continues to be <130/80 and wants to cancel  appointment. ? ?Thank you, ? ?Laurey Arrow, PharmD ?PGY1 Pharmacy Resident ?[id number] [place], [place] [address]  ?Phone: [phone]; Fax: [phone]  ? ?Ramond Dial, Pharm.D, BCPS, CPP ?[id number] [place], [place] [address]  ?Phone: [phone]; Fax: [phone]  ? ?

## 2021-07-16 ENCOUNTER — Ambulatory Visit

## 2021-07-24 ENCOUNTER — Other Ambulatory Visit: Payer: Self-pay

## 2021-07-24 ENCOUNTER — Telehealth: Payer: Self-pay

## 2021-07-24 DIAGNOSIS — J309 Allergic rhinitis, unspecified: Secondary | ICD-10-CM

## 2021-07-24 DIAGNOSIS — R052 Subacute cough: Secondary | ICD-10-CM

## 2021-07-24 NOTE — Telephone Encounter (Signed)
error 

## 2021-07-25 MED ORDER — FLUTICASONE PROPIONATE HFA 110 MCG/ACT IN AERO: 1.0000 | INHALATION_SPRAY | Freq: Two times a day (BID) | RESPIRATORY_TRACT | 0 refills | Status: DC

## 2021-08-02 NOTE — Telephone Encounter (Signed)
Started Prior auth for Fluticasone Propionate 50MCG/ACT suspension. Sebastiano Luecke Key: [id number]  Received message:  Drug is covered by current benefit plan. No further PA activity needed.

## 2021-08-15 ENCOUNTER — Other Ambulatory Visit: Payer: Self-pay

## 2021-08-15 MED ORDER — METOPROLOL TARTRATE 50 MG PO TABS: 50.0000 mg | ORAL_TABLET | Freq: Two times a day (BID) | ORAL | 1 refills | Status: DC

## 2021-11-08 IMAGING — DX DG CHEST 2V
2 series · 2 of 2 positions shown · non-contrast
Comparison: 06/24/2014

CLINICAL DATA: Chest pain and chest pressure.

EXAM:
CHEST - 2 VIEW

[chest pa]
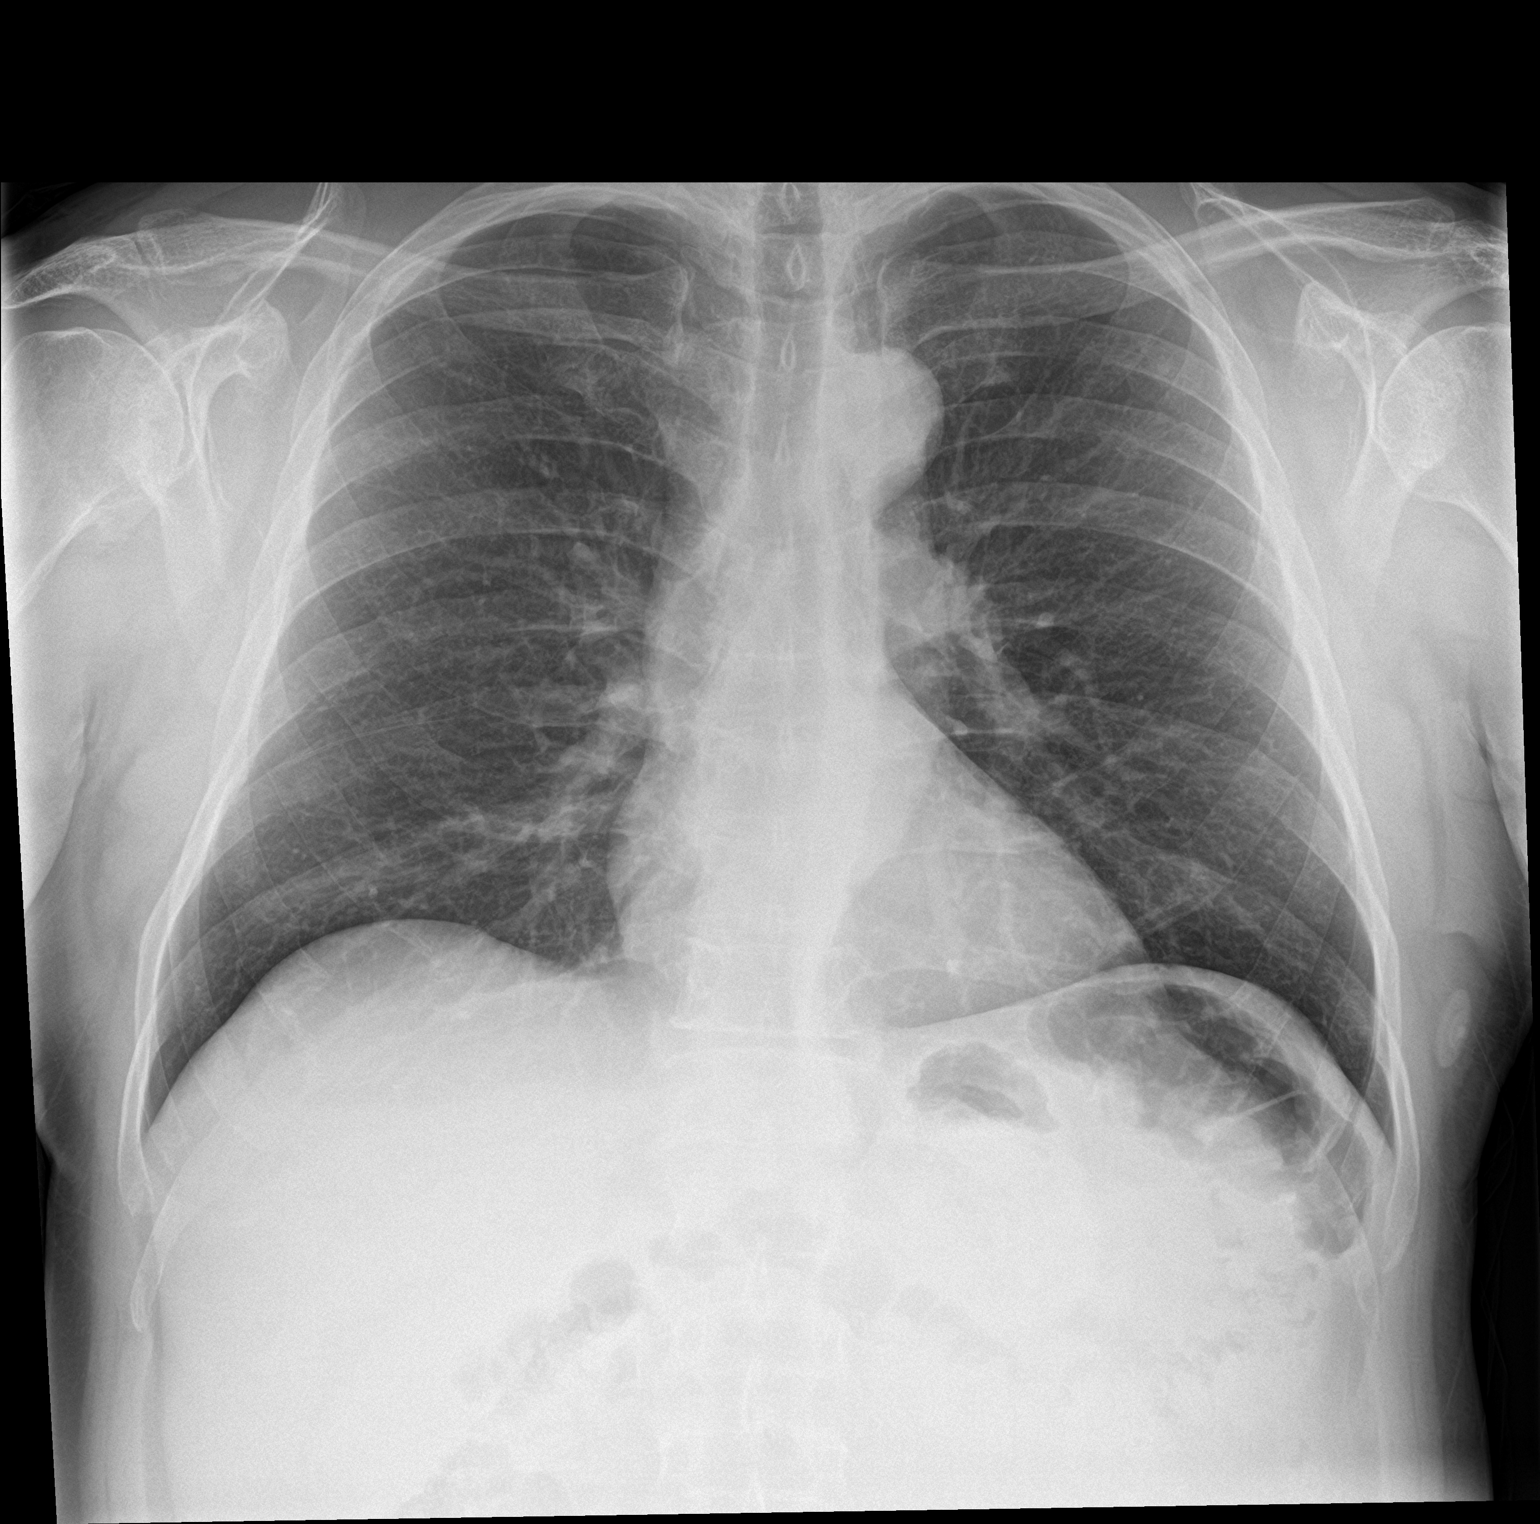

[chest lat]
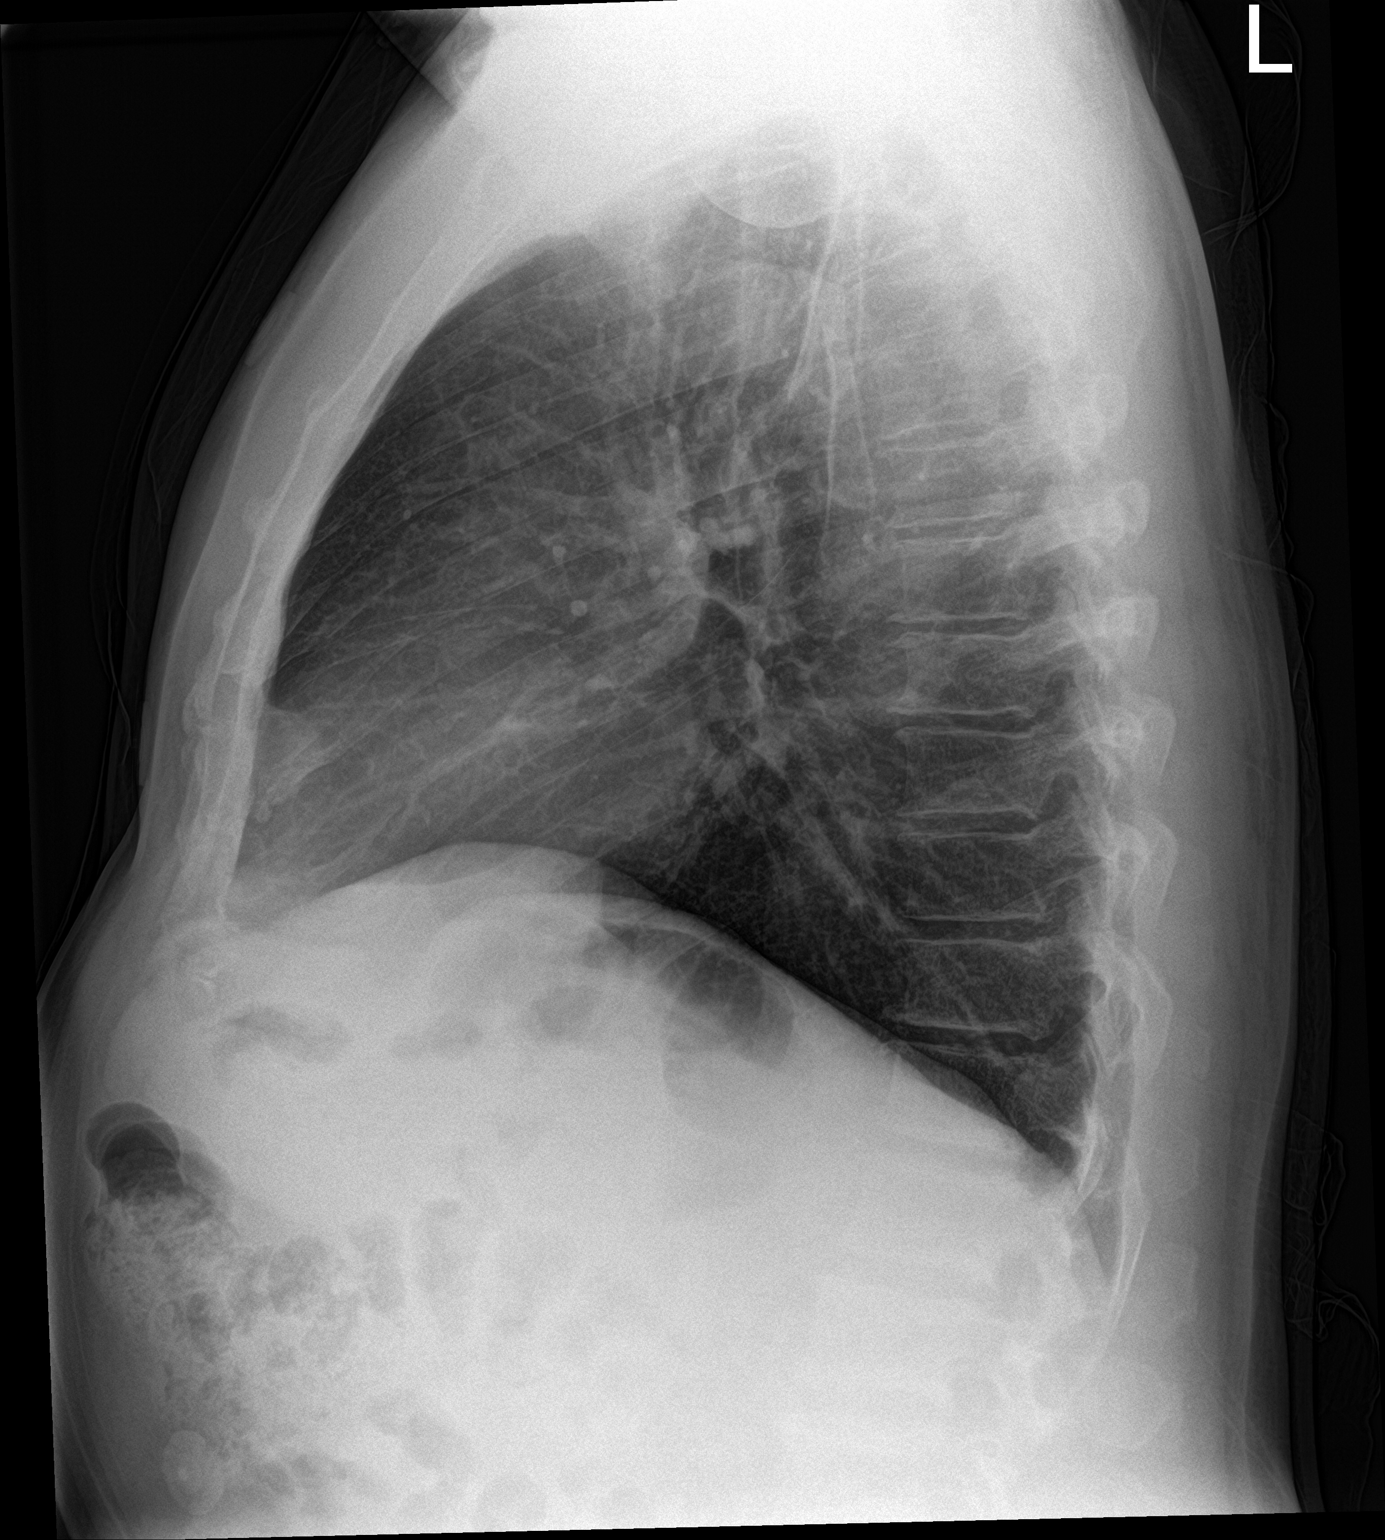

[2 of 2 positions shown; findings below may reference images not displayed]

FINDINGS: There is a rounded density projecting over the anterior heart border
on the lateral view, new since prior study. There is no pneumothorax
or focal infiltrate. Atherosclerotic changes are noted of the
thoracic aorta. The heart size is normal. There is no acute osseous
abnormality.
IMPRESSION: 1. No acute cardiopulmonary process.
2. New rounded density projecting over the anterior heart border on
the lateral view. This may represent artifact from overlapping
osseous structures. However, a follow-up two-view chest x-ray is
recommended in 4-6 weeks to confirm resolution of this finding as an
underlying pulmonary nodule is not excluded.

## 2021-11-12 ENCOUNTER — Other Ambulatory Visit: Payer: Self-pay

## 2021-11-12 DIAGNOSIS — J309 Allergic rhinitis, unspecified: Secondary | ICD-10-CM

## 2021-11-12 MED ORDER — FLUTICASONE PROPIONATE 50 MCG/ACT NA SUSP: 2.0000 | Freq: Every day | NASAL | 1 refills | Status: DC

## 2021-11-26 ENCOUNTER — Telehealth: Payer: Self-pay | Admitting: Cardiology

## 2021-11-26 DIAGNOSIS — I10 Essential (primary) hypertension: Secondary | ICD-10-CM

## 2021-11-26 NOTE — Telephone Encounter (Signed)
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

## 2021-11-26 NOTE — Telephone Encounter (Signed)
Spoke with the patient and scheduled him for an echocardiogram.

## 2021-11-26 NOTE — Telephone Encounter (Signed)
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  The forms are due at the end of November.   ( Due to high call volume...) Pt called back and told this will be forwarded to provider and RN to initiate.

## 2021-11-29 IMAGING — CR DG CHEST 2V
2 series · 2 of 2 positions shown · non-contrast
Comparison: 09/29/2019.

CLINICAL DATA: History of CABG.

EXAM:
CHEST - 2 VIEW

[chest pa]
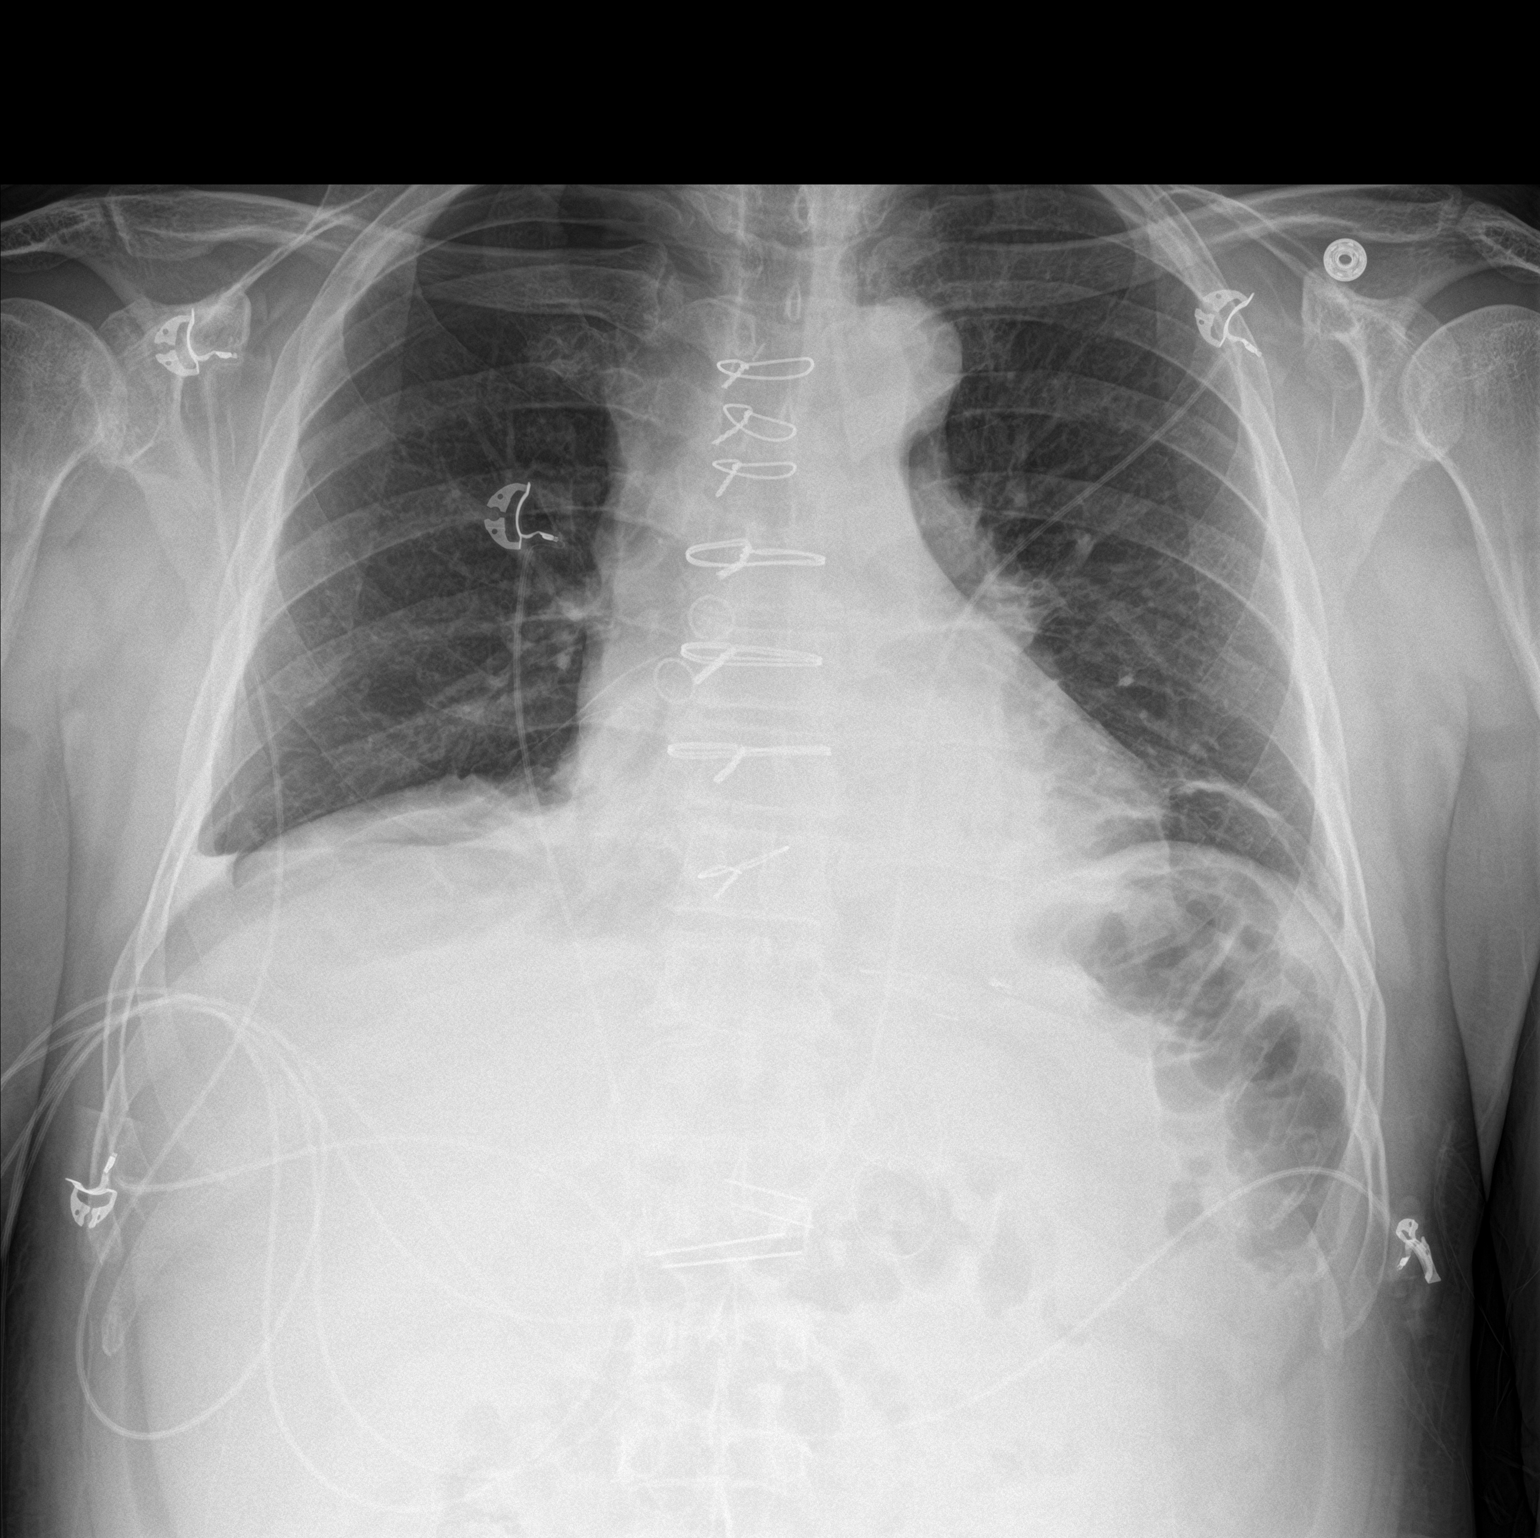

[chest lat]
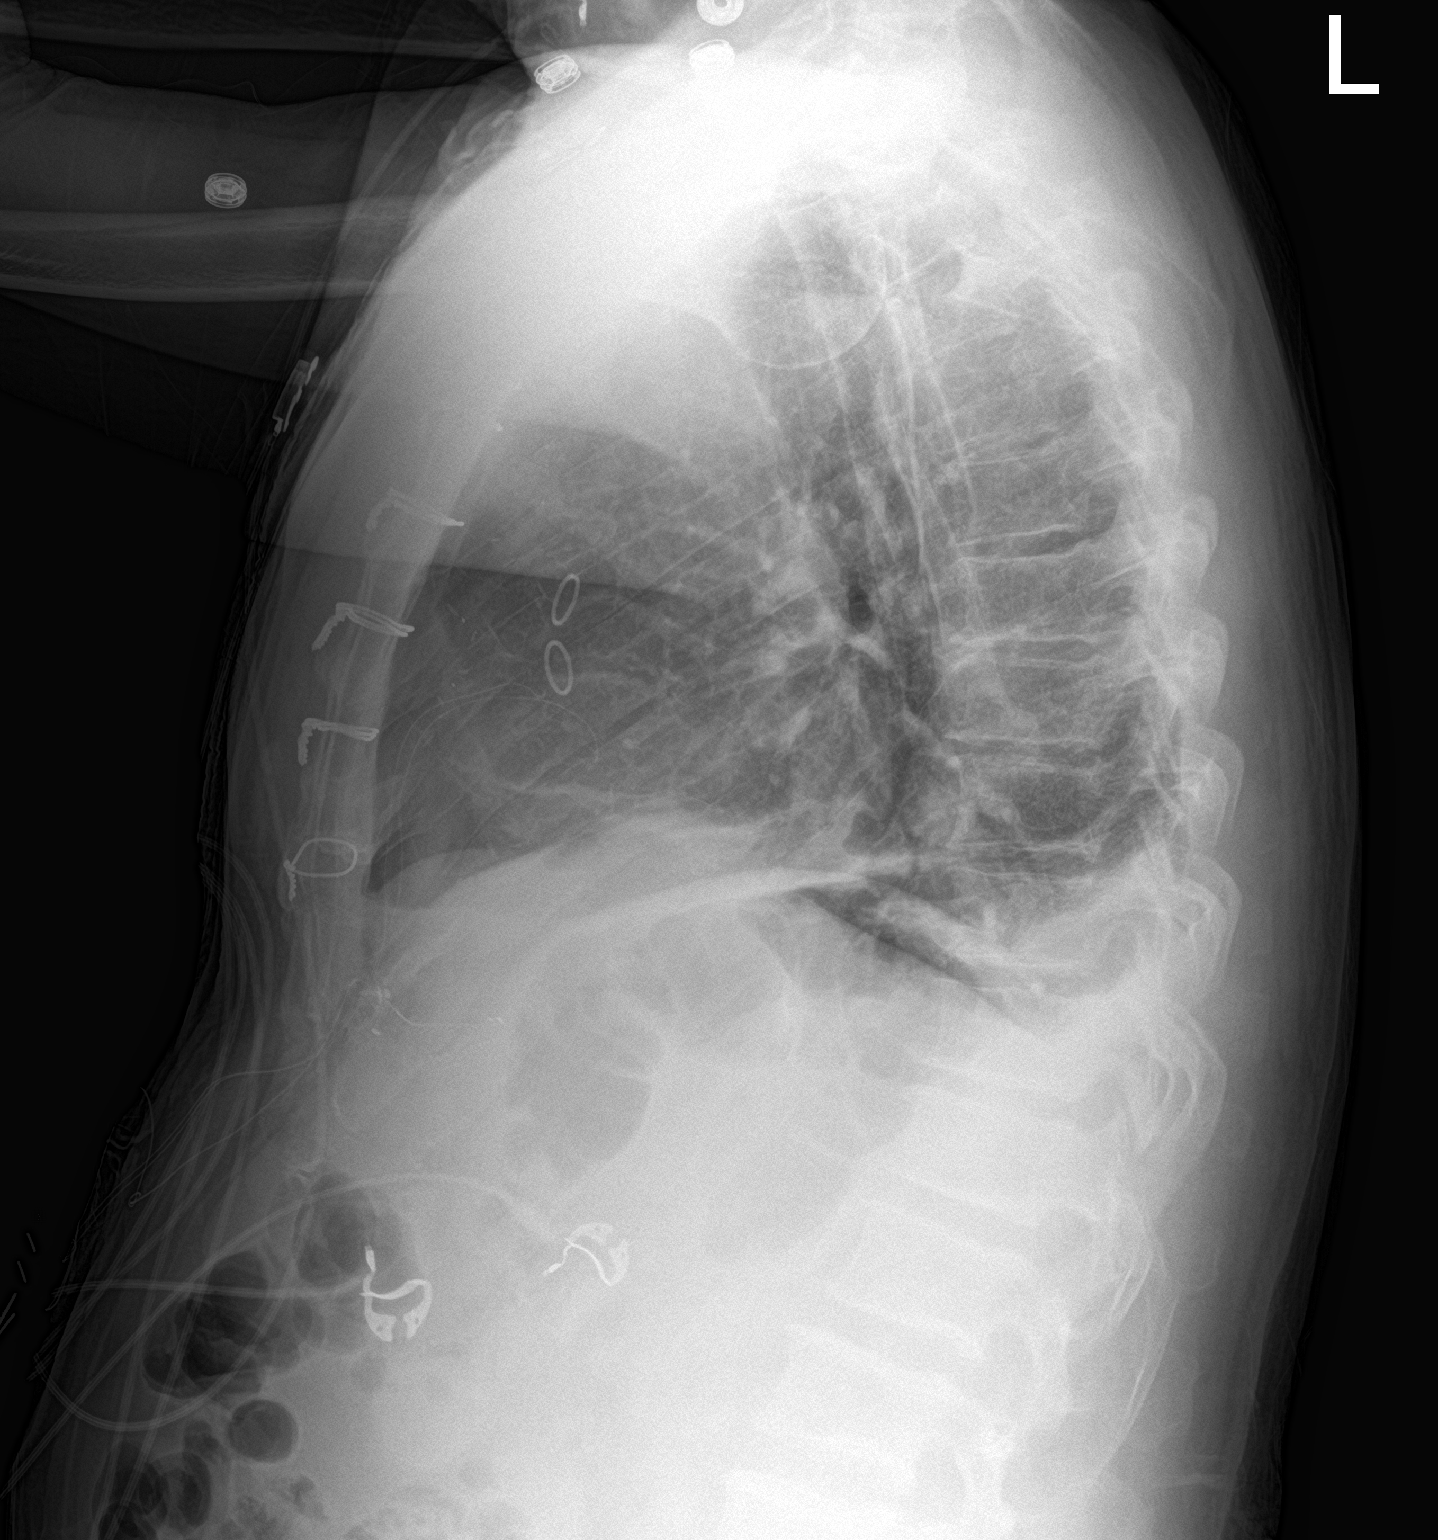

[2 of 2 positions shown; findings below may reference images not displayed]

FINDINGS: Interim removal of right IJ line. Prior CABG. Heart size normal.
Mild bibasilar atelectasis. Persistent moderate right pneumothorax.
Small right pleural effusion noted on today's exam.
IMPRESSION: 1.  Interim removal of right IJ line.

2. Persistent moderate right pneumothorax. Small right pleural
effusion noted on today's exam.

3.  Persistent bibasilar atelectasis.

4.  Prior CABG.  Heart size stable.

## 2021-12-04 ENCOUNTER — Ambulatory Visit (HOSPITAL_COMMUNITY): Attending: Cardiology

## 2021-12-04 ENCOUNTER — Telehealth: Payer: Self-pay | Admitting: Cardiology

## 2021-12-04 DIAGNOSIS — I10 Essential (primary) hypertension: Secondary | ICD-10-CM | POA: Insufficient documentation

## 2021-12-04 DIAGNOSIS — Z0279 Encounter for issue of other medical certificate: Secondary | ICD-10-CM

## 2021-12-04 NOTE — Telephone Encounter (Signed)
Forms and payment were received on __10/4/23___ from patient. Placed in "MD's box."

## 2021-12-06 ENCOUNTER — Encounter: Payer: Self-pay | Admitting: Cardiology

## 2021-12-06 LAB — ECHOCARDIOGRAM COMPLETE
Area-P 1/2: 3.95 cm2
S' Lateral: 2.8 cm

## 2021-12-11 NOTE — Telephone Encounter (Signed)
Per Dr. Radford Pax patient needs to be seen for his annual follow up before paperwork can be filled out. Left message for patient to call back to schedule an appointment with Dr. Radford Pax or an APP.

## 2021-12-11 NOTE — Telephone Encounter (Signed)
Patient scheduled to see Ambrose Pancoast, NP on 10/16. I have placed forms in Ernest's box.

## 2021-12-15 NOTE — Progress Notes (Unsigned)
Office Visit    Patient Name: Evan Rivera Date of Encounter: 12/15/2021  Primary Care Provider:  Pleas Koch, NP Primary Cardiologist:  Fransico Him, MD Primary Electrophysiologist: None  Chief Complaint    Evan Rivera is a 61 y.o. 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  Past Medical History    Past Medical History:  Diagnosis Date   Allergy    seasonal   Aortic insufficiency    Trivial by echo 12/2021   CAD (coronary artery disease) of bypass graft    s/p CABG x 4   GERD (gastroesophageal reflux disease)    Hyperlipidemia    no meds    Hypertension    Past Surgical History:  Procedure Laterality Date   COLONOSCOPY     > 10 yeras ago- normal per pt.    CORONARY ARTERY BYPASS GRAFT N/A 09/27/2019   Procedure: CORONARY ARTERY BYPASS GRAFTING (CABG), ON PUMP, TIMES 4, USING LEFT INTERNAL MAMMARY ARTERY, LEFT RADIAL ARTERY, AND ENDOSCOPICALLY HARVESTED RIGHT GREATER SAPHENOUS VEIN. LIMA to LAD, LEFT RADIAL ARTERY to OM1, SVG to DIAG 1, SVG to PLB;  Surgeon: Grace Isaac, MD;  Location: [facility];  Service: Open Heart Surgery;  Laterality: N/A;   ENDOVEIN HARVEST OF GREATER SAPHENOUS VEIN Right 09/27/2019   Procedure: ENDOVEIN HARVEST OF GREATER SAPHENOUS VEIN;  Surgeon: Grace Isaac, MD;  Location: [place];  Service: Open Heart Surgery;  Laterality: Right;   HERNIA REPAIR     umbilical    LEFT HEART CATH AND CORONARY ANGIOGRAPHY N/A 09/26/2019   Procedure: LEFT HEART CATH AND CORONARY ANGIOGRAPHY;  Surgeon: Martinique, Peter M, MD;  Location: [facility];  Service: Cardiovascular;  Laterality: N/A;   RADIAL ARTERY HARVEST Left 09/27/2019   Procedure: RADIAL ARTERY HARVEST;  Surgeon: Grace Isaac, MD;  Location: [facility];  Service: Open Heart Surgery;  Laterality: Left;   RESECTION OF MEDIASTINAL MASS N/A 09/27/2019   Procedure: RESECTION OF  INCIDENTAL ANTERIOR  MEDIASTINAL MASS;  Surgeon: Grace Isaac, MD;  Location: [place];  Service: Open Heart Surgery;  Laterality: N/A;   TEE WITHOUT CARDIOVERSION N/A 09/27/2019   Procedure: TRANSESOPHAGEAL ECHOCARDIOGRAM (TEE);  Surgeon: Grace Isaac, MD;  Location: [facility];  Service: Open Heart Surgery;  Laterality: N/A;   TONSILLECTOMY      Allergies  Allergies  Allergen Reactions   Statins Anaphylaxis    Pt does not remember which statin caused the reaction   Drug [Tape] Hives    Reaction to some forms of bandaids - please use paper tape    History of Present Illness    Evan Rivera  is a 61 year old male with the above mention past medical history who 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  Evan Rivera was initially seen by Dr. Radford Pax about admitted inpatient for prolonged episode of chest pain and positive troponin.  He had complaint of 10 out of 10 chest pain and troponins elevated to 776 subsequently to 4000.  Patient ruled in for NSTEMI and had LHC performed by Dr. Martinique that showed severe three-vessel CAD.  He was consulted by CVTS and underwent CABG by Dr. Servando Snare on 09/2019.  Patient was started on Imdur due to right radial harvest and hospital course was complicated by right-sided pneumothorax that improved and was later discharged.  He was last seen by  Dr. Radford Pax on 01/2021.  During visit patient's blood pressure is not adequately controlled and his metoprolol was increased to 50 mg twice daily.  He was also referred to HTN clinic for titration.  Since last being seen in the office patient reports***.  Patient denies chest pain, palpitations, dyspnea, PND, orthopnea, nausea, vomiting, dizziness, syncope, edema, weight gain, or early satiety.     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 30 tablet 0   Ascorbic Acid (VITAMIN C) 1000 MG tablet Take 2,000 mg by mouth at bedtime.     aspirin 81 MG chewable tablet Chew 1 tablet (81 mg total) by mouth daily.     cholecalciferol (VITAMIN D) 25 MCG (1000 UNIT) tablet Take 2,000 Units by mouth at bedtime.     ezetimibe (ZETIA) 10 MG tablet Take 1 tablet (10 mg total) by mouth at bedtime. 90 tablet 3   fluticasone (FLONASE) 50 MCG/ACT nasal spray Place 2 sprays into both nostrils daily. 48 g 1   fluticasone (FLOVENT HFA) 110 MCG/ACT inhaler Inhale 1 puff into the lungs 2 (two) times daily. 1 each 0   Garlic 631 MG TABS Take by mouth daily.     metoprolol tartrate (LOPRESSOR) 50 MG tablet Take 1 tablet (50 mg total) by mouth 2 (two) times daily. 180 tablet 1   omeprazole (PRILOSEC) 20 MG capsule Take 1 capsule (20 mg total) by mouth daily. For heartburn 90 capsule 3   REPATHA SURECLICK 497 MG/ML SOAJ INJECT 140 MG UNDER THE SKIN EVERY 14 DAYS 6 mL 3   No current facility-administered medications for this visit.     Review of Systems  Please see the history of present illness.    (+)*** (+)***  All other systems reviewed and are otherwise negative except as noted above.  Physical Exam    Wt Readings from Last 3 Encounters:  05/07/21 171 lb (77.6 kg)  04/16/21 170 lb (77.1 kg)  01/03/21 166 lb (75.3 kg)   WY:OVZCH were no vitals filed for this visit.,There is no height or weight on file to calculate BMI.  Constitutional:      Appearance: Healthy appearance. Not in distress.  Neck:     Vascular: JVD normal.  Pulmonary:     Effort: Pulmonary effort is normal.     Breath sounds: No wheezing. No rales. Diminished in the bases Cardiovascular:     Normal rate. Regular rhythm. Normal S1. Normal S2.      Murmurs: There is no murmur.  Edema:    Peripheral edema absent.  Abdominal:     Palpations: Abdomen is soft non tender. There is no hepatomegaly.  Skin:    General: Skin is warm and  dry.  Neurological:     General: No focal deficit present.     Mental Status: Alert and oriented to person, place and time.     Cranial Nerves: Cranial nerves are intact.  EKG/LABS/Other Studies Reviewed    ECG personally reviewed by me today - ***  Risk Assessment/Calculations:   {Does this patient have ATRIAL FIBRILLATION?[phone]}        Lab Results  Component Value Date   WBC 5.5 08/15/2020   HGB 14.5 08/15/2020   HCT 43.5 08/15/2020   MCV 92.4 08/15/2020   PLT 356 08/15/2020  Lab Results  Component Value Date   CREATININE 0.81 08/15/2020   BUN 17 08/15/2020   NA 138 08/15/2020   K 4.1 08/15/2020   CL 104 08/15/2020   CO2 24 08/15/2020   Lab Results  Component Value Date   ALT 46 (H) 01/03/2021   AST 19 11/17/2019   ALKPHOS 88 11/17/2019   BILITOT 0.3 11/17/2019   Lab Results  Component Value Date   CHOL 124 01/03/2021   HDL 32 (L) 01/03/2021   LDLCALC 37 01/03/2021   LDLDIRECT 97.0 09/13/2019   TRIG 379 (H) 01/03/2021   CHOLHDL 3.9 01/03/2021    Lab Results  Component Value Date   HGBA1C 5.6 09/26/2019    Assessment & Plan    1.  Coronary artery disease -s/p CABG x4 in 2021.  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  -Continue GDMT with ASA 81 mg, Plavix 75 mg and Lopressor 50 mg twice daily  2.  Essential hypertension: -Patient's blood pressure today was   3.  Hyperlipidemia -History of statin intolerance -Patient's LDL cholesterol was 37 Continue Zetia 10 mg and Repatha  4.***      Disposition: Follow-up with Fransico Him, MD or APP in *** months {Are you ordering a CV Procedure (e.g. stress test, cath, DCCV, TEE, etc)?   Press F2        :[id number]}   Medication Adjustments/Labs and Tests Ordered: Current medicines are reviewed at length with the patient today.  Concerns regarding medicines are outlined above.   Signed, Mable Fill, Evan Nestle, NP 12/15/2021, 5:35 PM [facility]  Note:  This document  was prepared using Dragon voice recognition software and may include unintentional dictation errors.

## 2021-12-16 ENCOUNTER — Encounter: Payer: Self-pay | Admitting: Nurse Practitioner

## 2021-12-16 ENCOUNTER — Ambulatory Visit: Attending: Nurse Practitioner | Admitting: Nurse Practitioner

## 2021-12-16 VITALS — BP 138/88 | HR 71 | Ht 65.0 in | Wt 176.0 lb

## 2021-12-16 DIAGNOSIS — I10 Essential (primary) hypertension: Secondary | ICD-10-CM

## 2021-12-16 DIAGNOSIS — Z951 Presence of aortocoronary bypass graft: Secondary | ICD-10-CM | POA: Diagnosis not present

## 2021-12-16 DIAGNOSIS — E7800 Pure hypercholesterolemia, unspecified: Secondary | ICD-10-CM | POA: Diagnosis not present

## 2021-12-16 DIAGNOSIS — E663 Overweight: Secondary | ICD-10-CM | POA: Diagnosis not present

## 2021-12-16 MED ORDER — LISINOPRIL 5 MG PO TABS: 5.0000 mg | ORAL_TABLET | Freq: Every day | ORAL | 3 refills | Status: DC

## 2021-12-16 MED ORDER — EZETIMIBE 10 MG PO TABS: 10.0000 mg | ORAL_TABLET | Freq: Every day | ORAL | 3 refills | Status: DC

## 2021-12-16 NOTE — Patient Instructions (Signed)
Medication Instructions:  Your physician has recommended you make the following change in your medication:    Stop taking metoprolol as directed by Jaquelyn Bitter. Start taking lisinopril '5mg'$  daily after stopping metoprolol   *If you need a refill on your cardiac medications before your next appointment, please call your pharmacy*   Lab Work: Bmet 4 weeks If you have labs (blood work) drawn today and your tests are completely normal, you will receive your results only by: [place] (if you have MyChart) OR A paper copy in the mail If you have any lab test that is abnormal or we need to change your treatment, we will call you to review the results.   Follow-Up: At [facility], you and your health needs are our priority.  As part of our continuing mission to provide you with exceptional heart care, we have created designated Provider Care Teams.  These Care Teams include your primary Cardiologist (physician) and Advanced Practice Providers (APPs -  Physician Assistants and Nurse Practitioners) who all work together to provide you with the care you need, when you need it.  Your next appointment:   2 month(s)  The format for your next appointment:   In Person  Provider:   Ambrose Pancoast, NP

## 2022-01-13 ENCOUNTER — Ambulatory Visit: Attending: Nurse Practitioner

## 2022-01-13 DIAGNOSIS — Z951 Presence of aortocoronary bypass graft: Secondary | ICD-10-CM

## 2022-01-14 ENCOUNTER — Telehealth: Payer: Self-pay

## 2022-01-14 DIAGNOSIS — I10 Essential (primary) hypertension: Secondary | ICD-10-CM

## 2022-01-14 LAB — BASIC METABOLIC PANEL
BUN/Creatinine Ratio: 16 (ref 10–24)
BUN: 14 mg/dL (ref 8–27)
CO2: 20 mmol/L (ref 20–29)
Calcium: 9.3 mg/dL (ref 8.6–10.2)
Chloride: 101 mmol/L (ref 96–106)
Creatinine, Ser: 0.87 mg/dL (ref 0.76–1.27)
Glucose: 123 mg/dL — ABNORMAL HIGH (ref 70–99)
Potassium: 4.5 mmol/L (ref 3.5–5.2)
Sodium: 138 mmol/L (ref 134–144)
eGFR: 98 mL/min/{1.73_m2} (ref >59)

## 2022-01-14 NOTE — Telephone Encounter (Signed)
Spoke with the patient and states he wants to know if he needs to ween himself off the medicine before starting the new medicine. He states he was told he can not stop the medication.   Spoke with Jaquelyn Bitter who suggests the Start Lisinopril '10mg'$  once a day;  decrease metoprolol to '25mg'$  daily for 7 days  then decrease metoprolol to 12.'5mg'$  once a day for 7 days then stop Metoprolol all while continuing to take lisinopril. He states he wants the patient to complete a bmet in 2 weeks. And advised the patient to contact the office with nay questions or concerns.   Patient states he is driving and would prefer I send Ernest's recommendations via mychart. I advised the pateint that he can always contact the office via mychart. Patient voiced understanding.

## 2022-01-14 NOTE — Telephone Encounter (Signed)
Contacted the patient to give lab results and see how his blood pressure is doing. He states he tried to new blood pressure regimen for three days but remembered he had his DOT physical. 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 He states his blood pressure was elevated with the new regimen. He states the top numbers were in the 150s. The patient states now that he has passes his DOT physical he has went back to the new prescribed medication. The patient apologized for making the change and stated he forgot to mention it to Jaquelyn Bitter at his las appointment. The patient states his blood pressure is still elevated.

## 2022-02-10 NOTE — Progress Notes (Deleted)
Office Visit    Patient Name: BUCK MCAFFEE Date of Encounter: 02/10/2022  Primary Care Provider:  Pleas Koch, NP Primary Cardiologist:  Fransico Him, MD Primary Electrophysiologist: None  Chief Complaint    Evan Rivera is a 61 y.o. male with PMH of CAD s/p CABG x4 09/2019 with radial artery harvest, HLD, HTN, remote tobacco abuse who presents today for 7-monthfollow-up of hypertension.  Past Medical History    Past Medical History:  Diagnosis Date   Allergy    seasonal   Aortic insufficiency    Trivial by echo 12/2021   CAD (coronary artery disease) of bypass graft    s/p CABG x 4   GERD (gastroesophageal reflux disease)    Hyperlipidemia    no meds    Hypertension    Past Surgical History:  Procedure Laterality Date   COLONOSCOPY     > 10 yeras ago- normal per pt.    CORONARY ARTERY BYPASS GRAFT N/A 09/27/2019   Procedure: CORONARY ARTERY BYPASS GRAFTING (CABG), ON PUMP, TIMES 4, USING LEFT INTERNAL MAMMARY ARTERY, LEFT RADIAL ARTERY, AND ENDOSCOPICALLY HARVESTED RIGHT GREATER SAPHENOUS VEIN. LIMA to LAD, LEFT RADIAL ARTERY to OM1, SVG to DIAG 1, SVG to PLB;  Surgeon: GGrace Isaac MD;  [place] [facility]  Service: Open Heart Surgery;  Laterality: N/A;   ENDOVEIN HARVEST OF GREATER SAPHENOUS VEIN Right 09/27/2019   Procedure: ENDOVEIN HARVEST OF GREATER SAPHENOUS VEIN;  Surgeon: GGrace Isaac MD;  [place] [facility]  Service: Open Heart Surgery;  Laterality: Right;   HERNIA REPAIR     umbilical    LEFT HEART CATH AND CORONARY ANGIOGRAPHY N/A 09/26/2019   Procedure: LEFT HEART CATH AND CORONARY ANGIOGRAPHY;  Surgeon: JMartinique Peter M, MD;  [place] [facility];  Service: Cardiovascular;  Laterality: N/A;   RADIAL ARTERY HARVEST Left 09/27/2019   Procedure: RADIAL ARTERY HARVEST;  Surgeon: GGrace Isaac MD;  [place] [facility]  Service: Open Heart Surgery;  Laterality: Left;   RESECTION OF MEDIASTINAL MASS N/A 09/27/2019   Procedure:  RESECTION OF INCIDENTAL ANTERIOR  MEDIASTINAL MASS;  Surgeon: GGrace Isaac MD;  [place] [facility]  Service: Open Heart Surgery;  Laterality: N/A;   TEE WITHOUT CARDIOVERSION N/A 09/27/2019   Procedure: TRANSESOPHAGEAL ECHOCARDIOGRAM (TEE);  Surgeon: GGrace Isaac MD;  [place] [facility]  Service: Open [place] Surgery;  Laterality: N/A;   TONSILLECTOMY      Allergies  Allergies  Allergen Reactions   Statins Anaphylaxis    Pt does not remember which statin caused the reaction   Drug [Tape] Hives    Reaction to some forms of bandaids - please use paper tape    History of Present Illness    Evan Rivera is a 61year old male with the above mention past medical history who 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  Mr. WMargaretmary Bayleywas initially seen by Dr. TRadford Paxabout admitted inpatient for prolonged episode of chest pain and positive troponin.  He had complaint of 10 out of 10 chest pain and troponins elevated to 776 subsequently to 4000.  Patient ruled in for NSTEMI and had LHC performed by Dr. JMartiniquethat showed severe three-vessel CAD.  He was consulted by CVTS and underwent CABG by Dr. GServando Snareon 09/2019.  Patient was started on Imdur due to right radial harvest and hospital course was complicated by right-sided pneumothorax that improved and was later discharged. He was last seen by  Dr. Radford Pax on 01/2021.  During visit patient's blood pressure is not adequately controlled and his metoprolol was increased to 50 mg twice daily. He was also referred to HTN clinic for titration.  He was last seen 12/2021 for follow-up and DOT physical completion.  During our visit his blood pressure was well-controlled and patient noted increased fatigue and dizziness with metoprolol.  His metoprolol was discontinued and lisinopril 5 mg was started  Since last being seen in the office patient reports***.  Patient denies chest pain, palpitations, dyspnea, PND, orthopnea, nausea, vomiting, dizziness, syncope,  edema, weight gain, or early satiety.     ***Notes:  Home Medications    Current Outpatient Medications  Medication Sig Dispense Refill   acetaminophen (TYLENOL) 500 MG tablet Take 2 tablets (1,000 mg total) by mouth every 6 (six) hours as needed for mild pain, fever or headache. 30 tablet 0   albuterol (VENTOLIN HFA) 108 (90 Base) MCG/ACT inhaler Inhale into the lungs.     Ascorbic Acid (VITAMIN C) 1000 MG tablet Take 2,000 mg by mouth at bedtime.     aspirin 81 MG chewable tablet Chew 1 tablet (81 mg total) by mouth daily.     cholecalciferol (VITAMIN D) 25 MCG (1000 UNIT) tablet Take 2,000 Units by mouth at bedtime.     ezetimibe (ZETIA) 10 MG tablet Take 1 tablet (10 mg total) by mouth at bedtime. 90 tablet 3   fluticasone (FLONASE) 50 MCG/ACT nasal spray Place 2 sprays into both nostrils daily. 48 g 1   fluticasone (FLOVENT HFA) 110 MCG/ACT inhaler Inhale 1 puff into the lungs 2 (two) times daily. 1 each 0   Garlic 696 MG TABS Take by mouth daily.     lisinopril (ZESTRIL) 5 MG tablet Take 1 tablet (5 mg total) by mouth daily. 90 tablet 3   omeprazole (PRILOSEC) 20 MG capsule Take 1 capsule (20 mg total) by mouth daily. For heartburn 90 capsule 3   REPATHA SURECLICK 295 MG/ML SOAJ INJECT 140 MG UNDER THE SKIN EVERY 14 DAYS 6 mL 3   No current facility-administered medications for this visit.     Review of Systems  Please see the history of present illness.    (+)*** (+)***  All other systems reviewed and are otherwise negative except as noted above.  Physical Exam    Wt Readings from Last 3 Encounters:  12/16/21 176 lb (79.8 kg)  05/07/21 171 lb (77.6 kg)  04/16/21 170 lb (77.1 kg)   MW:UXLKG were no vitals filed for this visit.,There is no height or weight on file to calculate BMI.  Constitutional:      Appearance: Healthy appearance. Not in distress.  Neck:     Vascular: JVD normal.  Pulmonary:     Effort: Pulmonary effort is normal.     Breath sounds: No  wheezing. No rales. Diminished in the bases Cardiovascular:     Normal rate. Regular rhythm. Normal S1. Normal S2.      Murmurs: There is no murmur.  Edema:    Peripheral edema absent.  Abdominal:     Palpations: Abdomen is soft non tender. There is no hepatomegaly.  Skin:    General: Skin is warm and dry.  Neurological:     General: No focal deficit present.     Mental Status: Alert and oriented to person, place and time.     Cranial Nerves: Cranial nerves are intact.  EKG/LABS/Other Studies Reviewed    ECG personally reviewed by me today - ***  Risk Assessment/Calculations:   {Does this patient have ATRIAL FIBRILLATION?:[id number]}        Lab Results  Component Value Date   WBC 5.5 08/15/2020   HGB 14.5 08/15/2020   HCT 43.5 08/15/2020   MCV 92.4 08/15/2020   PLT 356 08/15/2020   Lab Results  Component Value Date   CREATININE 0.87 01/13/2022   BUN 14 01/13/2022   NA 138 01/13/2022   K 4.5 01/13/2022   CL 101 01/13/2022   CO2 20 01/13/2022   Lab Results  Component Value Date   ALT 46 (H) 01/03/2021   AST 19 11/17/2019   ALKPHOS 88 11/17/2019   BILITOT 0.3 11/17/2019   Lab Results  Component Value Date   CHOL 124 01/03/2021   HDL 32 (L) 01/03/2021   LDLCALC 37 01/03/2021   LDLDIRECT 97.0 09/13/2019   TRIG 379 (H) 01/03/2021   CHOLHDL 3.9 01/03/2021    Lab Results  Component Value Date   HGBA1C 5.6 09/26/2019    Assessment & Plan    1.  Coronary artery disease -s/p CABG x4 in 2021.  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 -Today patient reports no chest pain or anginal equivalent -Continue GDMT with ASA 81 mg, Praluent and Zetia   2.  Essential hypertension: -Patient's blood pressure today was well controlled at 138/88 -We will discontinue metoprolol 50 mg twice daily and start lisinopril 5 mg daily     3.  Hyperlipidemia -History of statin intolerance -Patient's LDL cholesterol was 37 Continue Zetia 10 mg and Repatha   4.   Overweight: -Patient's BMI is 29.29 kg/m       Disposition: Follow-up with Fransico Him, MD or APP in *** months {Are you ordering a CV Procedure (e.g. stress test, cath, DCCV, TEE, etc)?   Press F2        :[id number]}   Medication Adjustments/Labs and Tests Ordered: Current medicines are reviewed at length with the patient today.  Concerns regarding medicines are outlined above.   Signed, Mable Fill, Marissa Nestle, NP 02/10/2022, 2:52 PM [facility]  Note:  This document was prepared using Dragon voice recognition software and may include unintentional dictation errors.

## 2022-02-11 ENCOUNTER — Ambulatory Visit: Admitting: Nurse Practitioner

## 2022-02-11 DIAGNOSIS — I10 Essential (primary) hypertension: Secondary | ICD-10-CM

## 2022-02-19 NOTE — Progress Notes (Unsigned)
Office Visit    Patient Name: Evan Rivera Date of Encounter: 02/19/2022  Primary Care Provider:  Pleas Koch, NP Primary Cardiologist:  Fransico Him, MD Primary Electrophysiologist: None  Chief Complaint    Evan Rivera is a 61 y.o. male with PMH of CAD s/p CABG x4 09/2019 with radial artery harvest, HLD, HTN, remote tobacco abuse who presents today for 22-monthfollow-up of hypertension.   Past Medical History    Past Medical History:  Diagnosis Date   Allergy    seasonal   Aortic insufficiency    Trivial by echo 12/2021   CAD (coronary artery disease) of bypass graft    s/p CABG x 4   GERD (gastroesophageal reflux disease)    Hyperlipidemia    no meds    Hypertension    Past Surgical History:  Procedure Laterality Date   COLONOSCOPY     > 10 yeras ago- normal per pt.    CORONARY ARTERY BYPASS GRAFT N/A 09/27/2019   Procedure: CORONARY ARTERY BYPASS GRAFTING (CABG), ON PUMP, TIMES 4, USING LEFT INTERNAL MAMMARY ARTERY, LEFT RADIAL ARTERY, AND ENDOSCOPICALLY HARVESTED RIGHT GREATER SAPHENOUS VEIN. LIMA to LAD, LEFT RADIAL ARTERY to OM1, SVG to DIAG 1, SVG to PLB;  Surgeon: GGrace Isaac MD;  [place]  Service: Open Heart Surgery;  Laterality: N/A;   ENDOVEIN HARVEST OF GREATER SAPHENOUS VEIN Right 09/27/2019   Procedure: ENDOVEIN HARVEST OF GREATER SAPHENOUS VEIN;  Surgeon: GGrace Isaac MD;  [place] [facility]  Service: Open Heart Surgery;  Laterality: Right;   HERNIA REPAIR     umbilical    LEFT HEART CATH AND CORONARY ANGIOGRAPHY N/A 09/26/2019   Procedure: LEFT HEART CATH AND CORONARY ANGIOGRAPHY;  Surgeon: JMartinique Peter M, MD;  [place] [facility];  Service: Cardiovascular;  Laterality: N/A;   RADIAL ARTERY HARVEST Left 09/27/2019   Procedure: RADIAL ARTERY HARVEST;  Surgeon: GGrace Isaac MD;  [place] [facility]  Service: Open Heart Surgery;  Laterality: Left;   RESECTION OF MEDIASTINAL MASS N/A 09/27/2019   Procedure:  RESECTION OF INCIDENTAL ANTERIOR  MEDIASTINAL MASS;  Surgeon: GGrace Isaac MD;  [place] [facility]  Service: Open Heart Surgery;  Laterality: N/A;   TEE WITHOUT CARDIOVERSION N/A 09/27/2019   Procedure: TRANSESOPHAGEAL ECHOCARDIOGRAM (TEE);  Surgeon: GGrace Isaac MD;  [place] [facility]  Service: Open Heart Surgery;  Laterality: N/A;   TONSILLECTOMY      Allergies  Allergies  Allergen Reactions   Statins Anaphylaxis    Pt does not remember which statin caused the reaction   Drug [Tape] Hives    Reaction to some forms of bandaids - please use paper tape    History of Present Illness    Evan Rivera is a 61year old male with the above mention past medical history who 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  Mr. Evan Bayleywas initially seen by Dr. TRadford Paxabout admitted inpatient for prolonged episode of chest pain and positive troponin.  He had complaint of 10 out of 10 chest pain and troponins elevated to 776 subsequently to 4000.  Patient ruled in for NSTEMI and had LHC performed by Dr. JMartiniquethat showed severe three-vessel CAD.  He was consulted by CVTS and underwent CABG by Dr. GServando Snareon 09/2019.  Patient was started on Imdur due to right radial harvest and hospital course was complicated by right-sided pneumothorax that improved and was later discharged. He was last seen  by Dr. Radford Pax on 01/2021.  During visit patient's blood pressure is not adequately controlled and his metoprolol was increased to 50 mg twice daily. He was also referred to HTN clinic for titration.  He was last seen 12/2021 for follow-up and DOT physical completion.  During our visit his blood pressure was well-controlled and patient noted increased fatigue and dizziness with metoprolol.  His metoprolol was discontinued and lisinopril 5 mg was started.   Mr. Evan Rivera presents today for 102-monthfollow-up alone.  Since last being seen in the office patient reports that he is doing well and tolerating his new blood  pressure medication without any adverse reaction.  His blood pressure today is well-controlled at 122/78 and heart rate is 84 bpm.  He reports that previously he did not start his lisinopril following his appointment.  He has been on his medicine a total of 3 weeks at this time and has not had any adverse reactions.  He is planning to join the gym with his wife during the first of the year and is anticipating losing weight.  During our visit we discussed primary prevention and secondary prevention in regard to controlling hypertension.  We discussed the importance of abstaining from excess salt in the diet and weight control and reducing blood pressure numbers.  Patient denies chest pain, palpitations, dyspnea, PND, orthopnea, nausea, vomiting, dizziness, syncope, edema, weight gain, or early satiety.        Home Medications    Current Outpatient Medications  Medication Sig Dispense Refill   acetaminophen (TYLENOL) 500 MG tablet Take 2 tablets (1,000 mg total) by mouth every 6 (six) hours as needed for mild pain, fever or headache. 30 tablet 0   albuterol (VENTOLIN HFA) 108 (90 Base) MCG/ACT inhaler Inhale into the lungs.     Ascorbic Acid (VITAMIN C) 1000 MG tablet Take 2,000 mg by mouth at bedtime.     aspirin 81 MG chewable tablet Chew 1 tablet (81 mg total) by mouth daily.     cholecalciferol (VITAMIN D) 25 MCG (1000 UNIT) tablet Take 2,000 Units by mouth at bedtime.     ezetimibe (ZETIA) 10 MG tablet Take 1 tablet (10 mg total) by mouth at bedtime. 90 tablet 3   fluticasone (FLONASE) 50 MCG/ACT nasal spray Place 2 sprays into both nostrils daily. 48 g 1   fluticasone (FLOVENT HFA) 110 MCG/ACT inhaler Inhale 1 puff into the lungs 2 (two) times daily. 1 each 0   Garlic 1431MG TABS Take by mouth daily.     lisinopril (ZESTRIL) 5 MG tablet Take 1 tablet (5 mg total) by mouth daily. 90 tablet 3   omeprazole (PRILOSEC) 20 MG capsule Take 1 capsule (20 mg total) by mouth daily. For heartburn 90  capsule 3   REPATHA SURECLICK 1540MG/ML SOAJ INJECT 140 MG UNDER THE SKIN EVERY 14 DAYS 6 mL 3   No current facility-administered medications for this visit.     Review of Systems  Please see the history of present illness.     All other systems reviewed and are otherwise negative except as noted above.  Physical Exam    Wt Readings from Last 3 Encounters:  12/16/21 176 lb (79.8 kg)  05/07/21 171 lb (77.6 kg)  04/16/21 170 lb (77.1 kg)   VGQ:QPYPPwere no vitals filed for this visit.,There is no height or weight on file to calculate BMI.  Constitutional:      Appearance: Healthy appearance. Not in distress.  Neck:  Vascular: JVD normal.  Pulmonary:     Effort: Pulmonary effort is normal.     Breath sounds: No wheezing. No rales. Diminished in the bases Cardiovascular:     Normal rate. Regular rhythm. Normal S1. Normal S2.      Murmurs: There is no murmur.  Edema:    Peripheral edema absent.  Abdominal:     Palpations: Abdomen is soft non tender. There is no hepatomegaly.  Skin:    General: Skin is warm and dry.  Neurological:     General: No focal deficit present.     Mental Status: Alert and oriented to person, place and time.     Cranial Nerves: Cranial nerves are intact.  EKG/LABS/Other Studies Reviewed    ECG personally reviewed by me today -none completed today     Lab Results  Component Value Date   WBC 5.5 08/15/2020   HGB 14.5 08/15/2020   HCT 43.5 08/15/2020   MCV 92.4 08/15/2020   PLT 356 08/15/2020   Lab Results  Component Value Date   CREATININE 0.87 01/13/2022   BUN 14 01/13/2022   NA 138 01/13/2022   K 4.5 01/13/2022   CL 101 01/13/2022   CO2 20 01/13/2022   Lab Results  Component Value Date   ALT 46 (H) 01/03/2021   AST 19 11/17/2019   ALKPHOS 88 11/17/2019   BILITOT 0.3 11/17/2019   Lab Results  Component Value Date   CHOL 124 01/03/2021   HDL 32 (L) 01/03/2021   LDLCALC 37 01/03/2021   LDLDIRECT 97.0 09/13/2019   TRIG 379  (H) 01/03/2021   CHOLHDL 3.9 01/03/2021    Lab Results  Component Value Date   HGBA1C 5.6 09/26/2019    Assessment & Plan    1.  Coronary artery disease -s/p CABG x4 in 2021.  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 -Today patient reports no chest pain or anginal equivalent -Continue GDMT with ASA 81 mg, Praluent and Zetia   2.  Essential hypertension: -Patient's blood pressure today was well controlled at 122/78 -Continue lisinopril 5 mg daily -We will recheck BMET in 3 weeks     3.  Hyperlipidemia -History of statin intolerance -Patient's LDL cholesterol was 37 Continue Zetia 10 mg and Repatha -We will check LFTs and lipids in 3 weeks   4.  Overweight: -Patient's BMI is 29.29 kg/m  -He will be joining [employer] first the year and is planning to work out on a steady basis.   Disposition: Follow-up with Fransico Him, MD or APP in 12 months    Medication Adjustments/Labs and Tests Ordered: Current medicines are reviewed at length with the patient today.  Concerns regarding medicines are outlined above.   Signed, Mable Fill, Marissa Nestle, NP 02/19/2022, 8:34 AM [facility]  Note:  This document was prepared using Dragon voice recognition software and may include unintentional dictation errors.

## 2022-02-20 ENCOUNTER — Ambulatory Visit: Attending: Nurse Practitioner | Admitting: Nurse Practitioner

## 2022-02-20 ENCOUNTER — Encounter: Payer: Self-pay | Admitting: Nurse Practitioner

## 2022-02-20 VITALS — BP 122/78 | HR 84 | Ht 64.0 in | Wt 176.0 lb

## 2022-02-20 DIAGNOSIS — I25700 Atherosclerosis of coronary artery bypass graft(s), unspecified, with unstable angina pectoris: Secondary | ICD-10-CM | POA: Diagnosis not present

## 2022-02-20 DIAGNOSIS — I10 Essential (primary) hypertension: Secondary | ICD-10-CM

## 2022-02-20 DIAGNOSIS — E663 Overweight: Secondary | ICD-10-CM

## 2022-02-20 DIAGNOSIS — E7800 Pure hypercholesterolemia, unspecified: Secondary | ICD-10-CM

## 2022-02-20 NOTE — Patient Instructions (Addendum)
Medication Instructions:  Your physician recommends that you continue on your current medications as directed. Please refer to the Current Medication list given to you today.  *If you need a refill on your cardiac medications before your next appointment, please call your pharmacy*   Lab Work: FASTING LABS AFTER 03/03/22 -CMET & LIPIDS If you have labs (blood work) drawn today and your tests are completely normal, you will receive your results only by: [place] (if you have MyChart) OR A paper copy in the mail If you have any lab test that is abnormal or we need to change your treatment, we will call you to review the results.   Testing/Procedures: NONE ORDERED   Follow-Up: At [facility], you and your health needs are our priority.  As part of our continuing mission to provide you with exceptional heart care, we have created designated Provider Care Teams.  These Care Teams include your primary Cardiologist (physician) and Advanced Practice Providers (APPs -  Physician Assistants and Nurse Practitioners) who all work together to provide you with the care you need, when you need it.  We recommend signing up for the patient portal called "MyChart".  Sign up information is provided on this After Visit Summary.  MyChart is used to connect with patients for Virtual Visits (Telemedicine).  Patients are able to view lab/test results, encounter notes, upcoming appointments, etc.  Non-urgent messages can be sent to your provider as well.   To learn more about what you can do with MyChart, go to [email or web address].    Your next appointment:   12 month(s)  The format for your next appointment:   In Person  Provider:   Fransico Him, MD     Other Instructions   Important Information About Sugar

## 2022-04-18 ENCOUNTER — Other Ambulatory Visit: Payer: Self-pay | Admitting: *Deleted

## 2022-04-18 MED ORDER — LISINOPRIL 5 MG PO TABS: 5.0000 mg | ORAL_TABLET | Freq: Every day | ORAL | 3 refills | Status: DC

## 2022-04-19 ENCOUNTER — Other Ambulatory Visit: Payer: Self-pay | Admitting: Cardiology

## 2022-04-19 DIAGNOSIS — E785 Hyperlipidemia, unspecified: Secondary | ICD-10-CM

## 2022-04-23 ENCOUNTER — Other Ambulatory Visit: Payer: Self-pay | Admitting: Cardiology

## 2022-05-13 ENCOUNTER — Other Ambulatory Visit: Payer: Self-pay | Admitting: Primary Care

## 2022-05-13 DIAGNOSIS — K219 Gastro-esophageal reflux disease without esophagitis: Secondary | ICD-10-CM

## 2022-05-14 NOTE — Telephone Encounter (Signed)
Patient is due for CPE/follow up, this will be required prior to any further refills.  Please schedule, thank you!   

## 2022-05-14 NOTE — Telephone Encounter (Signed)
LVM for patient to call back. ?

## 2022-05-14 NOTE — Telephone Encounter (Signed)
Spoke to pt, scheduled f/u for 06/05/22

## 2022-06-04 ENCOUNTER — Ambulatory Visit: Admitting: Primary Care

## 2022-06-04 ENCOUNTER — Encounter: Payer: Self-pay | Admitting: Primary Care

## 2022-06-04 VITALS — BP 142/82 | HR 67 | Temp 98.5°F | Ht 64.0 in | Wt 176.0 lb

## 2022-06-04 DIAGNOSIS — J309 Allergic rhinitis, unspecified: Secondary | ICD-10-CM

## 2022-06-04 DIAGNOSIS — Z23 Encounter for immunization: Secondary | ICD-10-CM

## 2022-06-04 DIAGNOSIS — Z0000 Encounter for general adult medical examination without abnormal findings: Secondary | ICD-10-CM

## 2022-06-04 DIAGNOSIS — M25511 Pain in right shoulder: Secondary | ICD-10-CM

## 2022-06-04 DIAGNOSIS — Z125 Encounter for screening for malignant neoplasm of prostate: Secondary | ICD-10-CM | POA: Diagnosis not present

## 2022-06-04 DIAGNOSIS — E7800 Pure hypercholesterolemia, unspecified: Secondary | ICD-10-CM | POA: Diagnosis not present

## 2022-06-04 DIAGNOSIS — M25512 Pain in left shoulder: Secondary | ICD-10-CM

## 2022-06-04 DIAGNOSIS — G8929 Other chronic pain: Secondary | ICD-10-CM

## 2022-06-04 DIAGNOSIS — I10 Essential (primary) hypertension: Secondary | ICD-10-CM

## 2022-06-04 NOTE — Patient Instructions (Addendum)
Stop by the lab prior to leaving today. I will notify you of your results once received.   Set up a visit with Dr. Lorelei Pont for your shoulders.  Monitor your blood pressure. It should run less than 130 on top and less than 90 on bottom.  Set up a nurse visit for 2-6 months to receive your second Shingles vaccine.  It was a pleasure to see you today!

## 2022-06-04 NOTE — Assessment & Plan Note (Signed)
Chronic and continued.  Discussed options, he will set up a visit with Dr. Lorelei Pont.

## 2022-06-04 NOTE — Assessment & Plan Note (Signed)
Above goal today, improved on recheck.  I've asked that he start monitoring at home and report if readings are consistently at or above 130/90. Continue lisinopril 5 mg. CMP pending.

## 2022-06-04 NOTE — Progress Notes (Signed)
Subjective:    Patient ID: Evan Rivera, male    DOB: Jul 18, 1960, 62 y.o.   MRN: [id number]  HPI  Evan Rivera is a very pleasant 62 y.o. male who presents today for complete physical and follow up of chronic conditions.  He would also like to discuss chronic shoulder pain. Located to the bilateral anterior shoulders with radiation of pain down his left upper extremity. Left worse than right. His pain is constant, worse with rest. He denies decrease in ROM, but he has noticed weakness. Evaluated by [facility] last year, diagnosed with bursitis and arthritis, but he did not proceed with treatment given the $16,000 price tag.   Immunizations: -Tetanus: Due -Shingles: Never completed  Diet: Fair diet.  Exercise: Walking  Eye exam: Completed several years ago Dental exam: Completes annually   Colonoscopy: Completed in 2017, due 2027  PSA: Due     Review of Systems  Constitutional:  Negative for unexpected weight change.  HENT:  Negative for rhinorrhea.   Respiratory:  Negative for cough and shortness of breath.   Cardiovascular:  Negative for chest pain.  Gastrointestinal:  Negative for constipation and diarrhea.  Genitourinary:  Negative for difficulty urinating.  Musculoskeletal:  Positive for arthralgias.  Skin:  Negative for rash.  Allergic/Immunologic: Positive for environmental allergies.  Neurological:  Negative for dizziness and headaches.  Psychiatric/Behavioral:  The patient is not nervous/anxious.          Past Medical History:  Diagnosis Date   Allergy    seasonal   Aortic insufficiency    Trivial by echo 12/2021   CAD (coronary artery disease) of bypass graft    s/p CABG x 4   GERD (gastroesophageal reflux disease)    Hyperlipidemia    no meds    Hypertension     Social History   Socioeconomic History   Marital status: Married    Spouse name: Not on file   Number of children: Not on file   Years of education: Not on file   Highest  education level: Not on file  Occupational History   Not on file  Tobacco Use   Smoking status: Former    Types: Cigarettes    Quit date: 06/23/1989    Years since quitting: 32.9   Smokeless tobacco: Former  Substance and Sexual Activity   Alcohol use: No   Drug use: No   Sexual activity: Not on file  Other Topics Concern   Not on file  Social History Narrative   Married.   2 children.   Works as a Administrator.   Enjoys kayaking.    [employer] years.     Social Determinants of Health   Financial Resource Strain: Not on file  Food Insecurity: Not on file  Transportation Needs: Not on file  Physical Activity: Not on file  Stress: Not on file  Social Connections: Not on file  Intimate Partner Violence: Not on file    Past Surgical History:  Procedure Laterality Date   COLONOSCOPY     > 10 yeras ago- normal per pt.    CORONARY ARTERY BYPASS GRAFT N/A 09/27/2019   Procedure: CORONARY ARTERY BYPASS GRAFTING (CABG), ON PUMP, TIMES 4, USING LEFT INTERNAL MAMMARY ARTERY, LEFT RADIAL ARTERY, AND ENDOSCOPICALLY HARVESTED RIGHT GREATER SAPHENOUS VEIN. LIMA to LAD, LEFT RADIAL ARTERY to OM1, SVG to DIAG 1, SVG to PLB;  Surgeon: Grace Isaac, MD;  Location: [facility];  Service: Open Heart Surgery;  Laterality: N/A;   ENDOVEIN HARVEST OF GREATER SAPHENOUS VEIN Right 09/27/2019   Procedure: ENDOVEIN HARVEST OF GREATER SAPHENOUS VEIN;  Surgeon: Grace Isaac, MD;  Location: [facility];  Service: Open Heart Surgery;  Laterality: Right;   HERNIA REPAIR     umbilical    LEFT HEART CATH AND CORONARY ANGIOGRAPHY N/A 09/26/2019   Procedure: LEFT HEART CATH AND CORONARY ANGIOGRAPHY;  Surgeon: Martinique, Peter M, MD;  Location: [facility];  Service: Cardiovascular;  Laterality: N/A;   RADIAL ARTERY HARVEST Left 09/27/2019   Procedure: RADIAL ARTERY HARVEST;  Surgeon: Grace Isaac, MD;  Location: [facility];  Service: Open Heart Surgery;  Laterality: Left;   RESECTION OF MEDIASTINAL MASS N/A  09/27/2019   Procedure: RESECTION OF INCIDENTAL ANTERIOR  MEDIASTINAL MASS;  Surgeon: Grace Isaac, MD;  Location: [facility];  Service: Open Heart Surgery;  Laterality: N/A;   TEE WITHOUT CARDIOVERSION N/A 09/27/2019   Procedure: TRANSESOPHAGEAL ECHOCARDIOGRAM (TEE);  Surgeon: Grace Isaac, MD;  Location: [facility];  Service: Open Heart Surgery;  Laterality: N/A;   TONSILLECTOMY      Family History  Problem Relation Age of Onset   Prostate cancer Father        late 75's   Hyperlipidemia Father    Hypertension Father    Heart disease Mother    Colon cancer Neg Hx    Colon polyps Neg Hx    Esophageal cancer Neg Hx    Rectal cancer Neg Hx    Stomach cancer Neg Hx     Allergies  Allergen Reactions   Statins Anaphylaxis    Pt does not remember which statin caused the reaction   Drug [Tape] Hives    Reaction to some forms of bandaids - please use paper tape    Current Outpatient Medications on File Prior to Visit  Medication Sig Dispense Refill   acetaminophen (TYLENOL) 500 MG tablet Take 2 tablets (1,000 mg total) by mouth every 6 (six) hours as needed for mild pain, fever or headache. 30 tablet 0   albuterol (VENTOLIN HFA) 108 (90 Base) MCG/ACT inhaler Inhale into the lungs as needed for wheezing or shortness of breath.     Ascorbic Acid (VITAMIN C) 1000 MG tablet Take 2,000 mg by mouth at bedtime.     aspirin 81 MG chewable tablet Chew 1 tablet (81 mg total) by mouth daily.     cholecalciferol (VITAMIN D) 25 MCG (1000 UNIT) tablet Take 2,000 Units by mouth at bedtime.     ezetimibe (ZETIA) 10 MG tablet Take 1 tablet (10 mg total) by mouth at bedtime. 90 tablet 3   fluticasone (FLONASE) 50 MCG/ACT nasal spray Place 2 sprays into both nostrils daily. 48 g 1   lisinopril (ZESTRIL) 5 MG tablet Take 1 tablet (5 mg total) by mouth daily. 90 tablet 3   REPATHA SURECLICK XX123456 MG/ML SOAJ INJECT 140 MG UNDER THE SKIN EVERY 14 DAYS 6 mL 3   Garlic 123XX123 MG TABS Take by mouth daily. (Patient  not taking: Reported on 06/04/2022)     omeprazole (PRILOSEC) 20 MG capsule TAKE 1 CAPSULE DAILY FOR HEARTBURN (Patient not taking: Reported on 06/04/2022) 30 capsule 0   No current facility-administered medications on file prior to visit.    BP (!) 142/82   Pulse 67   Temp 98.5 F (36.9 C) (Temporal)   Ht 5\' 4"  (1.626 m)   Wt 176 lb (79.8 kg)   SpO2 99%   BMI  30.21 kg/m  Objective:   Physical Exam HENT:     Right Ear: Tympanic membrane and ear canal normal.     Left Ear: Tympanic membrane and ear canal normal.     Nose: Nose normal.     Right Sinus: No maxillary sinus tenderness or frontal sinus tenderness.     Left Sinus: No maxillary sinus tenderness or frontal sinus tenderness.  Eyes:     Conjunctiva/sclera: Conjunctivae normal.  Neck:     Thyroid: No thyromegaly.     Vascular: No carotid bruit.  Cardiovascular:     Rate and Rhythm: Normal rate and regular rhythm.     Heart sounds: Normal heart sounds.  Pulmonary:     Effort: Pulmonary effort is normal.     Breath sounds: Normal breath sounds. No wheezing or rales.  Abdominal:     General: Bowel sounds are normal.     Palpations: Abdomen is soft.     Tenderness: There is no abdominal tenderness.  Musculoskeletal:        General: Normal range of motion.     Right shoulder: Normal range of motion.     Left shoulder: Normal range of motion.     Cervical back: Neck supple.  Skin:    General: Skin is warm and dry.  Neurological:     Mental Status: He is alert and oriented to person, place, and time.     Cranial Nerves: No cranial nerve deficit.     Deep Tendon Reflexes: Reflexes are normal and symmetric.  Psychiatric:        Mood and Affect: Mood normal.           Assessment & Plan:  Preventative health care Assessment & Plan: Tetanus and Shingrix due and provided.  Colonoscopy UTD, due 2027 PSA due and pending.  Discussed the importance of a healthy diet and regular exercise in order for weight loss, and  to reduce the risk of further co-morbidity.  Exam stable. Labs pending.  Follow up in 1 year for repeat physical.    Screening for prostate cancer -     PSA  Pure hypercholesterolemia Assessment & Plan: Repeat lipid panel pending.  Discussed the importance of a healthy diet and regular exercise in order for weight loss, and to reduce the risk of further co-morbidity.   Orders: -     Lipid panel -     Comprehensive metabolic panel -     CBC  Chronic pain of both shoulders Assessment & Plan: Chronic and continued.  Discussed options, he will set up a visit with Dr. Lorelei Pont.    Allergic rhinitis, unspecified seasonality, unspecified trigger Assessment & Plan: Controlled.  Continue albuterol inhaler PRN.   Benign hypertension Assessment & Plan: Above goal today, improved on recheck.  I've asked that he start monitoring at home and report if readings are consistently at or above 130/90. Continue lisinopril 5 mg. CMP pending.   Other orders -     Tdap vaccine greater than or equal to 7yo IM        Pleas Koch, NP

## 2022-06-04 NOTE — Assessment & Plan Note (Signed)
Tetanus and Shingrix due and provided.  Colonoscopy UTD, due 2027 PSA due and pending.  Discussed the importance of a healthy diet and regular exercise in order for weight loss, and to reduce the risk of further co-morbidity.  Exam stable. Labs pending.  Follow up in 1 year for repeat physical.

## 2022-06-04 NOTE — Assessment & Plan Note (Signed)
Controlled.   Continue albuterol inhaler PRN. 

## 2022-06-04 NOTE — Assessment & Plan Note (Signed)
Repeat lipid panel pending.  Discussed the importance of a healthy diet and regular exercise in order for weight loss, and to reduce the risk of further co-morbidity.  

## 2022-06-05 ENCOUNTER — Ambulatory Visit: Admitting: Primary Care

## 2022-06-05 ENCOUNTER — Other Ambulatory Visit: Payer: Self-pay

## 2022-06-05 DIAGNOSIS — E7800 Pure hypercholesterolemia, unspecified: Secondary | ICD-10-CM

## 2022-06-05 LAB — COMPREHENSIVE METABOLIC PANEL
ALT: 57 U/L — ABNORMAL HIGH (ref 0–53)
AST: 28 U/L (ref 0–37)
Albumin: 4.6 g/dL (ref 3.5–5.2)
Alkaline Phosphatase: 77 U/L (ref 39–117)
BUN: 14 mg/dL (ref 6–23)
CO2: 28 mEq/L (ref 19–32)
Calcium: 9.6 mg/dL (ref 8.4–10.5)
Chloride: 102 mEq/L (ref 96–112)
Creatinine, Ser: 1.02 mg/dL (ref 0.40–1.50)
GFR: 78.48 mL/min (ref >60.00)
Glucose, Bld: 83 mg/dL (ref 70–99)
Potassium: 4.6 mEq/L (ref 3.5–5.1)
Sodium: 138 mEq/L (ref 135–145)
Total Bilirubin: 0.5 mg/dL (ref 0.2–1.2)
Total Protein: 6.5 g/dL (ref 6.0–8.3)

## 2022-06-05 LAB — LIPID PANEL
Cholesterol: 202 mg/dL — ABNORMAL HIGH (ref 0–200)
HDL: 42.2 mg/dL (ref >39.00)
NonHDL: 160.07
Total CHOL/HDL Ratio: 5
Triglycerides: 320 mg/dL — ABNORMAL HIGH (ref 0.0–149.0)
VLDL: 64 mg/dL — ABNORMAL HIGH (ref 0.0–40.0)

## 2022-06-05 LAB — CBC
HCT: 41.9 % (ref 39.0–52.0)
Hemoglobin: 14.7 g/dL (ref 13.0–17.0)
MCHC: 35.2 g/dL (ref 30.0–36.0)
MCV: 89.7 fl (ref 78.0–100.0)
Platelets: 333 10*3/uL (ref 150.0–400.0)
RBC: 4.67 Mil/uL (ref 4.22–5.81)
RDW: 13 % (ref 11.5–15.5)
WBC: 6.2 10*3/uL (ref 4.0–10.5)

## 2022-06-05 LAB — PSA: PSA: 0.36 ng/mL (ref 0.10–4.00)

## 2022-06-05 LAB — LDL CHOLESTEROL, DIRECT: Direct LDL: 105 mg/dL

## 2022-06-09 ENCOUNTER — Ambulatory Visit: Admitting: Family Medicine

## 2022-06-09 ENCOUNTER — Encounter: Payer: Self-pay | Admitting: Family Medicine

## 2022-06-09 ENCOUNTER — Ambulatory Visit (INDEPENDENT_AMBULATORY_CARE_PROVIDER_SITE_OTHER)
Admission: RE | Admit: 2022-06-09 | Discharge: 2022-06-09 | Disposition: A | Discharge status: Home / Self Care | Source: Ambulatory Visit | Attending: Family Medicine | Admitting: Family Medicine

## 2022-06-09 VITALS — BP 120/80 | HR 80 | Temp 98.3°F | Ht 64.0 in | Wt 174.0 lb

## 2022-06-09 DIAGNOSIS — M25511 Pain in right shoulder: Secondary | ICD-10-CM

## 2022-06-09 DIAGNOSIS — M19011 Primary osteoarthritis, right shoulder: Secondary | ICD-10-CM

## 2022-06-09 DIAGNOSIS — G8929 Other chronic pain: Secondary | ICD-10-CM

## 2022-06-09 DIAGNOSIS — M19012 Primary osteoarthritis, left shoulder: Secondary | ICD-10-CM | POA: Diagnosis not present

## 2022-06-09 DIAGNOSIS — M25512 Pain in left shoulder: Secondary | ICD-10-CM

## 2022-06-09 MED ORDER — CELECOXIB 200 MG PO CAPS: 200.0000 mg | ORAL_CAPSULE | Freq: Every day | ORAL | 2 refills | Status: DC

## 2022-06-09 MED ORDER — TRIAMCINOLONE ACETONIDE 40 MG/ML IJ SUSP
40.0000 mg | Freq: Once | INTRAMUSCULAR | Status: AC
Start: 2022-06-09 — End: 2022-06-09
  Administered 2022-06-09: 40 mg via INTRA_ARTICULAR

## 2022-06-09 NOTE — Progress Notes (Unsigned)
    Evan Rivera T. Evan Cardamone, MD, CAQ Sports Medicine [facility] at [facility] [address] [place], [address]  Phone: [phone]  FAX: [phone]  Evan Rivera - 62 y.o. male  MRN [id number]  Date of Birth: 1960-06-26  Date: 06/09/2022  PCP: Doreene Nest, NP  Referral: Doreene Nest, NP  Chief Complaint  Patient presents with   Shoulder Pain    Bilateral   Subjective:   Evan Rivera is a 62 y.o. very pleasant male patient with Body mass index is 29.87 kg/m. who presents with the following:  Pleasant gentleman presents with some bilateral shoulder pain.  This has been chronic in character, and he was just seen by his primary care doctor a few days ago.  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  They quoted him $16,000 for treatment.  He is here today to talk about options regarding his chronic shoulder pain.  5 years ago, tore his R biceps.   R shoulder pain x 10 years or more.   Right now the pain in the left side is worse.   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  No service.   Start Celebrex scheduled  B shoulder injections     Review of Systems is noted in the HPI, as appropriate  Objective:   BP 120/80   Pulse 80   Temp 98.3 F (36.8 C) (Temporal)   Ht 5\' 4"  (1.626 m)   Wt 174 lb (78.9 kg)   SpO2 97%   BMI 29.87 kg/m   GEN: No acute distress; alert,appropriate. PULM: Breathing comfortably in no respiratory distress PSYCH: Normally interactive.   Laboratory and Imaging Data:  Assessment and Plan:   ***

## 2022-06-14 ENCOUNTER — Other Ambulatory Visit: Payer: Self-pay | Admitting: Primary Care

## 2022-06-14 DIAGNOSIS — K219 Gastro-esophageal reflux disease without esophagitis: Secondary | ICD-10-CM

## 2022-07-17 ENCOUNTER — Telehealth: Payer: Self-pay | Admitting: Cardiology

## 2022-07-17 DIAGNOSIS — Z79899 Other long term (current) drug therapy: Secondary | ICD-10-CM

## 2022-07-17 DIAGNOSIS — E7800 Pure hypercholesterolemia, unspecified: Secondary | ICD-10-CM

## 2022-07-17 NOTE — Telephone Encounter (Signed)
Patient is asking that nurse give him a call back, wanted to discuss things with her. Please advise

## 2022-07-17 NOTE — Telephone Encounter (Signed)
Patient calling because he was referred to lipid clinic and is asking if he can repeat his lipid labs before he has a visit with lipid clinic.   He states he had a visit with his PCP on 06/04/22 and his lipid panel looked worse than it did before. He states that he had forgotten to refill his Repatha and watch his diet when he did these labs and he got back on track with repatha and diet/exercise the next day. He states he was referred to Lipid clinic on 06/05/22 but he feels that since he has been doing what he is supposed to be doing for the past 6 weeks his labs might look significantly better and lipid clinic may not be necessary. Patient is requesting repeat lipid panel before he is seen in lipid clinic. Sent to Dr. Maudie Mercury clinic.

## 2022-07-17 NOTE — Telephone Encounter (Signed)
Yes, that is completely fine. His labs were good a year ago on Repatha. Please repeat in another 2-4 weeks.

## 2022-07-21 ENCOUNTER — Ambulatory Visit

## 2022-07-23 NOTE — Addendum Note (Signed)
Addended by: Luellen Pucker on: 07/23/2022 02:04 PM   Modules accepted: Orders

## 2022-07-23 NOTE — Telephone Encounter (Signed)
Called patient to schedule f/u cholesterol labs prior to Lipid clinic visit 08/11/22. Labs ordered and scheduled for 08/05/22, advised patient to be fasting.

## 2022-08-05 ENCOUNTER — Ambulatory Visit: Attending: Internal Medicine

## 2022-08-05 DIAGNOSIS — E7800 Pure hypercholesterolemia, unspecified: Secondary | ICD-10-CM

## 2022-08-05 DIAGNOSIS — Z79899 Other long term (current) drug therapy: Secondary | ICD-10-CM

## 2022-08-05 LAB — LIPID PANEL
Chol/HDL Ratio: 2.2 ratio (ref 0.0–5.0)
Cholesterol, Total: 108 mg/dL (ref 100–199)
HDL: 49 mg/dL (ref >39)
LDL Chol Calc (NIH): 41 mg/dL (ref 0–99)
Triglycerides: 94 mg/dL (ref 0–149)
VLDL Cholesterol Cal: 18 mg/dL (ref 5–40)

## 2022-08-05 LAB — ALT: ALT: 39 IU/L (ref 0–44)

## 2022-08-11 ENCOUNTER — Ambulatory Visit

## 2022-09-22 ENCOUNTER — Other Ambulatory Visit (HOSPITAL_COMMUNITY): Payer: Self-pay

## 2022-09-22 ENCOUNTER — Telehealth: Payer: Self-pay

## 2022-09-22 NOTE — Telephone Encounter (Signed)
Pharmacy Patient Advocate Encounter   Received notification from CoverMyMeds that prior authorization for REPATHA is required/requested.   Insurance verification completed.   The patient is insured through [employer] .   Per test claim: PA submitted to [employer] via CoverMyMeds Key/confirmation #/EOC [id number] Status is pending

## 2022-09-23 NOTE — Telephone Encounter (Signed)
Pharmacy Patient Advocate Encounter  Received notification from [employer] that Prior Authorization for REPATHA has been APPROVED from 08/24/22 to 03/02/98.Marland Kitchen

## 2022-10-15 IMAGING — DX DG CHEST 2V
2 series · 2 of 2 positions shown · non-contrast
Comparison: 10/20/2019

CLINICAL DATA: Shortness of breath

EXAM:
CHEST - 2 VIEW

[chest pa]
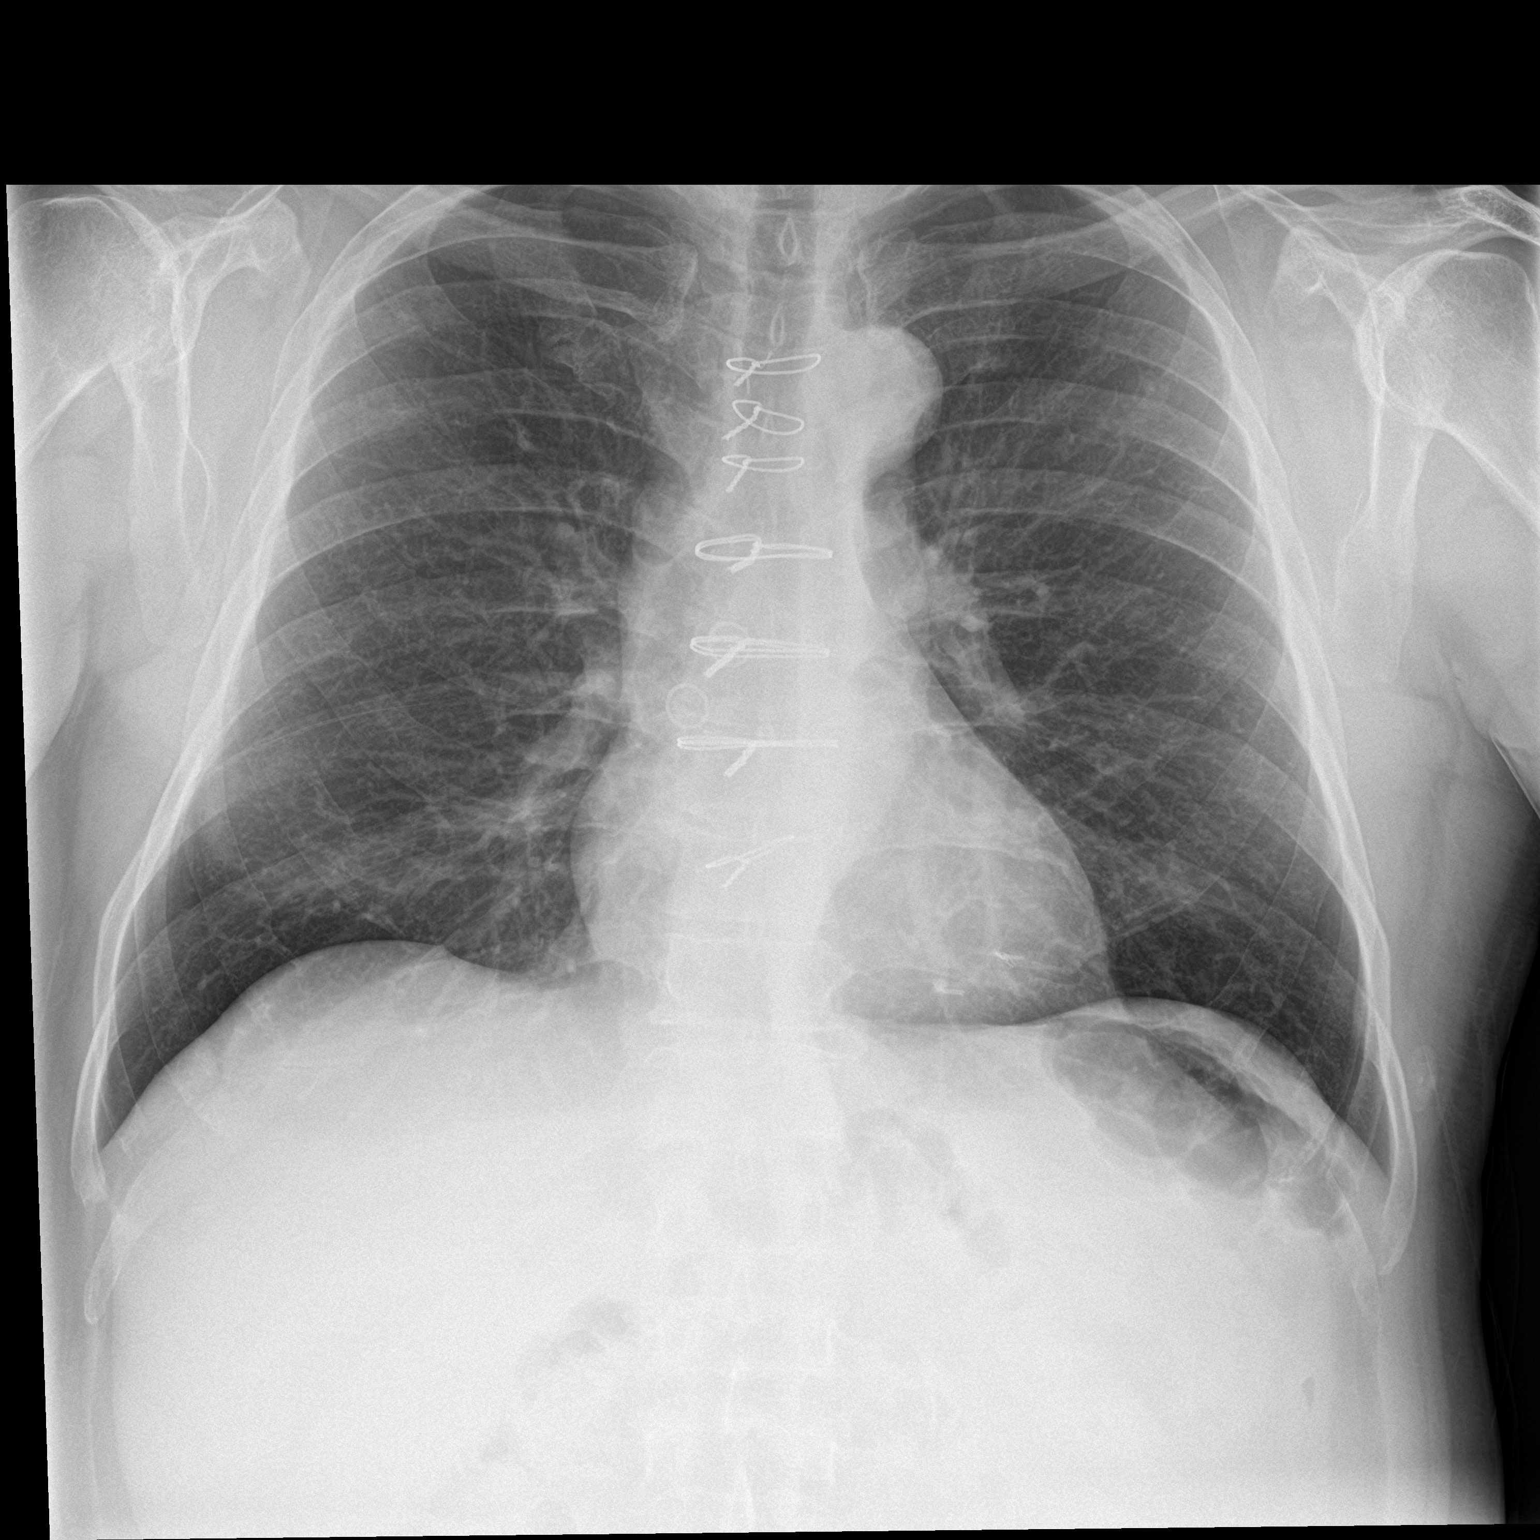

[chest lat]
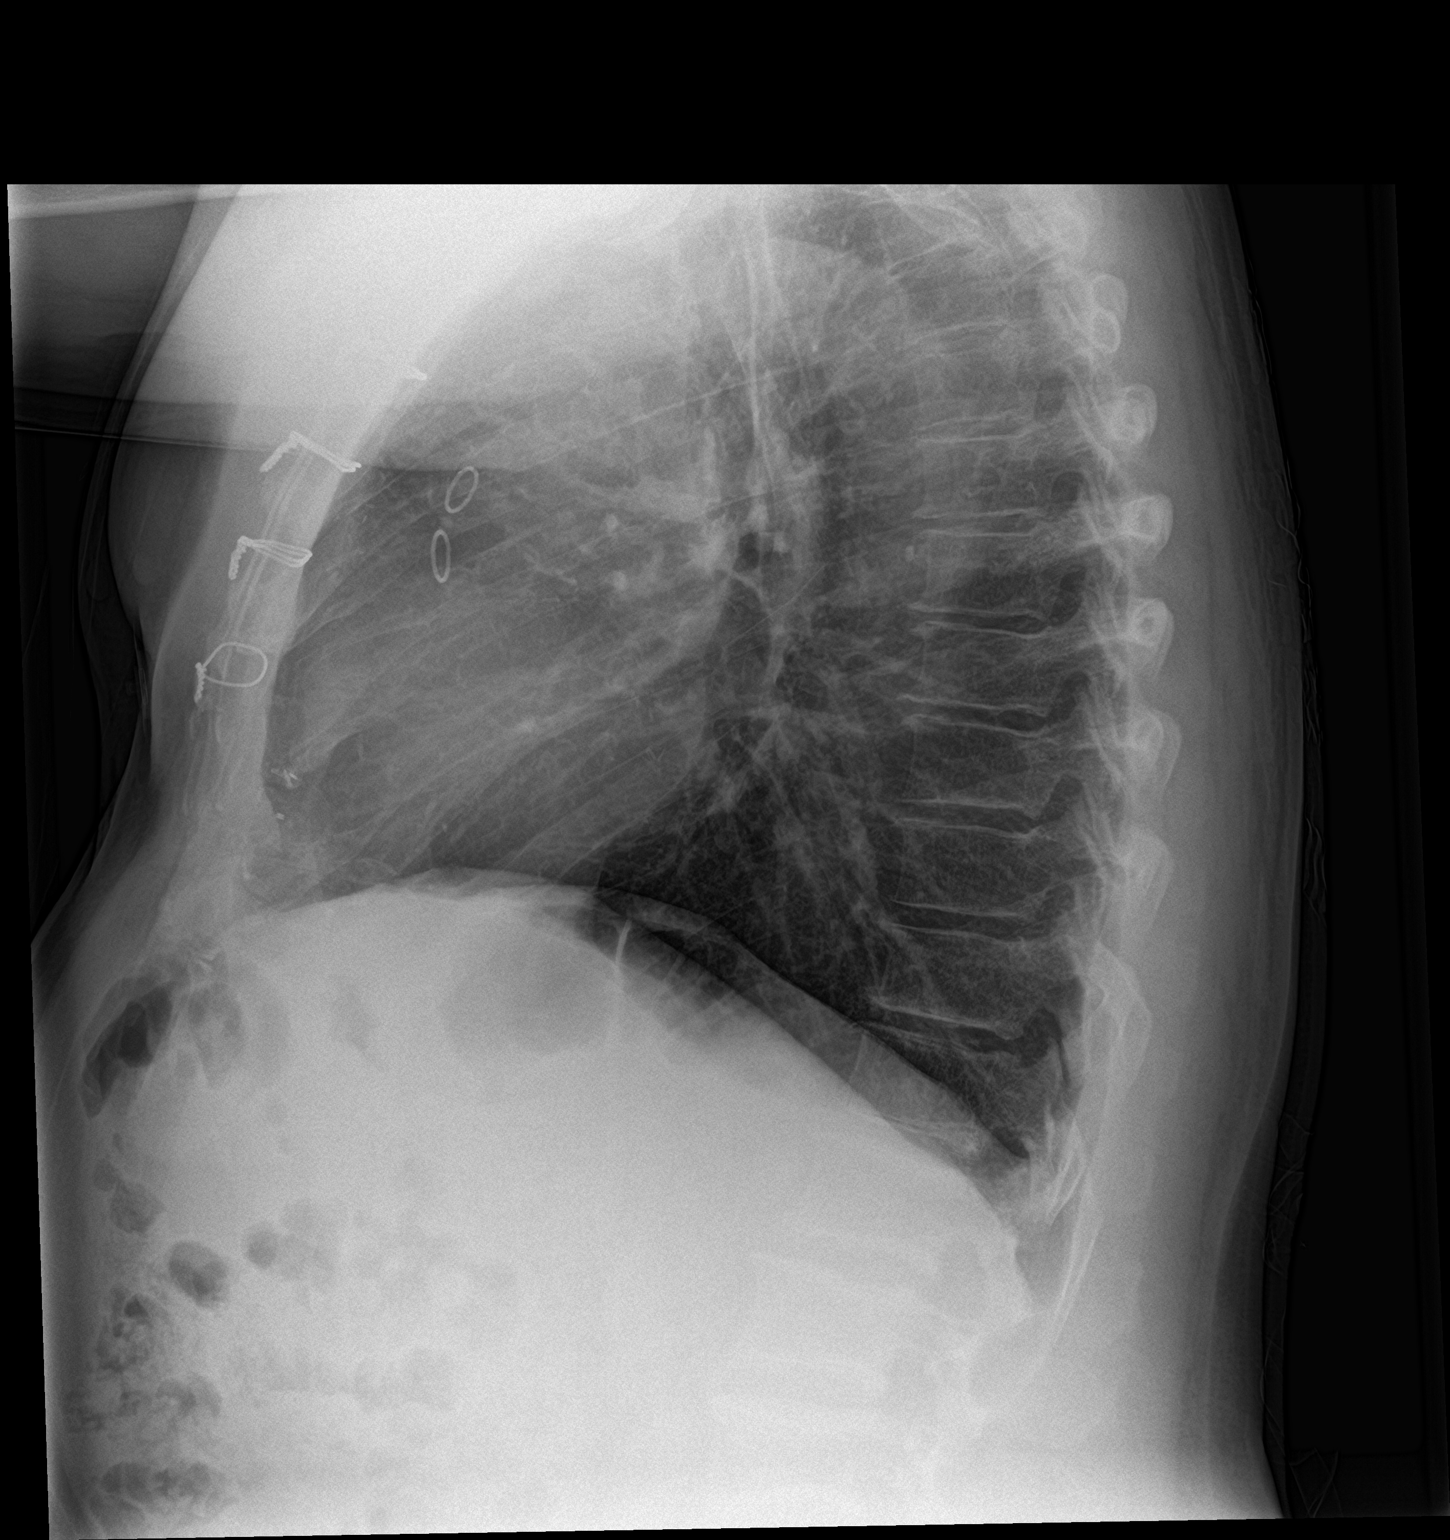

[2 of 2 positions shown; findings below may reference images not displayed]

FINDINGS: Post sternotomy changes. No focal opacity or pleural effusion.
Normal cardiomediastinal silhouette. No pneumothorax.
IMPRESSION: No active cardiopulmonary disease.

## 2022-10-16 ENCOUNTER — Encounter (INDEPENDENT_AMBULATORY_CARE_PROVIDER_SITE_OTHER): Payer: Self-pay

## 2022-11-04 ENCOUNTER — Ambulatory Visit (INDEPENDENT_AMBULATORY_CARE_PROVIDER_SITE_OTHER)

## 2022-11-04 DIAGNOSIS — Z23 Encounter for immunization: Secondary | ICD-10-CM | POA: Diagnosis not present

## 2022-11-04 NOTE — Progress Notes (Signed)
Per orders of Mayra Reel, DPN AGNP-C, injection of Shingles  given by Donnamarie Poag, CMA in left deltoid. Patient tolerated injection well. Information updated in [facility] as well for patient. Will call if any questions.

## 2022-12-02 ENCOUNTER — Telehealth: Payer: Self-pay | Admitting: Cardiology

## 2022-12-02 DIAGNOSIS — I10 Essential (primary) hypertension: Secondary | ICD-10-CM

## 2022-12-02 NOTE — Telephone Encounter (Signed)
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 Please advise

## 2022-12-03 NOTE — Telephone Encounter (Signed)
Call to patient to advise Dr. 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 Echo order placed, appt w/ Alden Server scheduled for 01/02/23.

## 2022-12-16 ENCOUNTER — Ambulatory Visit (HOSPITAL_COMMUNITY): Attending: Cardiovascular Disease

## 2022-12-16 DIAGNOSIS — I10 Essential (primary) hypertension: Secondary | ICD-10-CM | POA: Insufficient documentation

## 2022-12-16 LAB — ECHOCARDIOGRAM COMPLETE
Area-P 1/2: 2.97 cm2
P 1/2 time: 392 ms
S' Lateral: 2.5 cm

## 2022-12-17 ENCOUNTER — Telehealth: Payer: Self-pay

## 2022-12-17 NOTE — Telephone Encounter (Signed)
Reviewed with patient that echo showed normal pumping function of heart with mildly thickened heart muscle and increased stiffness of heart muscle called diastolic dysfunction which is seen with aging, patient verbalizes understanding. Also discussed mildly calcified Aortic valve.

## 2022-12-17 NOTE — Telephone Encounter (Signed)
-----   Message from Nurse Corky Crafts sent at 12/17/2022  8:44 AM EDT -----  ----- Message ----- From: Quintella Reichert, MD Sent: 12/17/2022   7:22 AM EDT To: Doreene Nest, NP; [facility] Triage  Echo showed normal pumping function of heart with mildly thickened heart muscle and increased stiffness of heart muscle called diastolic dysfunction which is seen with aging, mildly calcified Aortic valve

## 2023-01-01 NOTE — Progress Notes (Signed)
Cardiology Office Note    Patient Name: Evan Rivera Date of Encounter: 01/02/2023  Primary Care Provider:  Doreene Nest, NP Primary Cardiologist:  Armanda Magic, MD Primary Electrophysiologist: None   Past Medical History    Past Medical History:  Diagnosis Date   Allergy    seasonal   Aortic insufficiency    Trivial by echo 12/2021   CAD (coronary artery disease) of bypass graft    s/p CABG x 4   GERD (gastroesophageal reflux disease)    Hyperlipidemia    no meds    Hypertension     History of Present Illness  Evan Rivera is a 62 y.o. 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  Mr. Lafond was last seen on 02/20/2022 for follow-up visit.  During visit he was doing well with controlled blood pressures and was planning to start working out at [employer] with his wife.  He denied any chest pain or angina equivalent LDL cholesterol was at goal with Repatha.  He recently completed a 2D echo that showed EF of 65 to 70% with no RWMA and grade 1 DD with moderate LVH.  During today's visit the patient reports that he has been doing well with no new cardiac complaints since previous follow-up.  He is still working full-time as a [employer] architect and has not been able to follow a normal exercise routine due to his schedule.  During today's visit his blood pressure was elevated at 160/104 and was 142/96 on recheck.  He attributes his elevated blood pressure today to working extended hours and being under more stress lately.  He reports at home his blood pressures have been better controlled.  He has been compliant with his current medications and denies any adverse reactions.  During today's visit we also reviewed his echocardiogram results and patient had all questions answered to his satisfaction.  Patient denies chest pain, palpitations, dyspnea, PND,  orthopnea, nausea, vomiting, dizziness, syncope, edema, weight gain, or early satiety.   Review of Systems  Please see the history of present illness.    All other systems reviewed and are otherwise negative except as noted above.  Physical Exam    Wt Readings from Last 3 Encounters:  01/02/23 173 lb 9.6 oz (78.7 kg)  06/09/22 174 lb (78.9 kg)  06/04/22 176 lb (79.8 kg)   VS: Vitals:   01/02/23 0916 01/02/23 0938  BP: (!) 160/104 (!) 142/96  Pulse: 79   SpO2: 97%   ,Body mass index is 29.8 kg/m. GEN: Well nourished, well developed in no acute distress Neck: No JVD; No carotid bruits Pulmonary: Clear to auscultation without rales, wheezing or rhonchi  Cardiovascular: Normal rate. Regular rhythm. Normal S1. Normal S2.   Murmurs: There is no murmur.  ABDOMEN: Soft, non-tender, non-distended EXTREMITIES:  No edema; No deformity   EKG/LABS/ Recent Cardiac Studies   ECG personally reviewed by me today -sinus rhythm with rate of 79 bpm and no acute changes consistent with previous EKG.  Risk Assessment/Calculations:          Lab Results  Component Value Date   WBC 6.2 06/04/2022   HGB 14.7 06/04/2022   HCT 41.9 06/04/2022   MCV 89.7 06/04/2022   PLT 333.0 06/04/2022   Lab Results  Component Value Date   CREATININE 1.02 06/04/2022   BUN 14 06/04/2022  NA 138 06/04/2022   K 4.6 06/04/2022   CL 102 06/04/2022   CO2 28 06/04/2022   Lab Results  Component Value Date   CHOL 108 08/05/2022   HDL 49 08/05/2022   LDLCALC 41 08/05/2022   LDLDIRECT 105.0 06/04/2022   TRIG 94 08/05/2022   CHOLHDL 2.2 08/05/2022    Lab Results  Component Value Date   HGBA1C 5.6 09/26/2019   Assessment & Plan    1.  Coronary artery disease -s/p CABG x4 in 2021.  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 -Today patient reports no chest pain or anginal equivalent. -Patient is currently not following a normal routine exercise program and was encouraged to increase physical activity to at  least 150 minutes/week. -Continue current GDMT with ASA 81 mg, Repatha 140 mg/mL q. 14 days, 10 mg  2.  Essential hypertension: --HYPERTENSION CONTROL Vitals:   01/02/23 0916 01/02/23 0938  BP: (!) 160/104 (!) 142/96    The patient's blood pressure is elevated above target today.  In order to address the patient's elevated BP: Blood pressure will be monitored at home to determine if medication changes need to be made.; Follow up with general cardiology has been recommended.   -Over the next 2 weeks and report back findings clinic. -Lisinopril 5 mg daily   3.  Hyperlipidemia -History of statin intolerance -Patient's LDL cholesterol was 41 with excellent improvement to triglycerides Continue Zetia 10 mg and Repatha  4.  Overweight: -Patient's BMI is 29.80 kg/m    Disposition: Follow-up with Armanda Magic, MD or APP in 2 months   Signed, Napoleon Form, Leodis Rains, NP 01/02/2023, 10:37 AM [facility]

## 2023-01-02 ENCOUNTER — Encounter: Payer: Self-pay | Admitting: Nurse Practitioner

## 2023-01-02 ENCOUNTER — Ambulatory Visit: Attending: Nurse Practitioner | Admitting: Nurse Practitioner

## 2023-01-02 VITALS — BP 142/96 | HR 79 | Ht 64.0 in | Wt 173.6 lb

## 2023-01-02 DIAGNOSIS — I10 Essential (primary) hypertension: Secondary | ICD-10-CM | POA: Diagnosis not present

## 2023-01-02 DIAGNOSIS — I25700 Atherosclerosis of coronary artery bypass graft(s), unspecified, with unstable angina pectoris: Secondary | ICD-10-CM | POA: Diagnosis not present

## 2023-01-02 DIAGNOSIS — E663 Overweight: Secondary | ICD-10-CM | POA: Diagnosis not present

## 2023-01-02 DIAGNOSIS — E7800 Pure hypercholesterolemia, unspecified: Secondary | ICD-10-CM | POA: Diagnosis not present

## 2023-01-02 NOTE — Patient Instructions (Signed)
Medication Instructions:  Your physician recommends that you continue on your current medications as directed. Please refer to the Current Medication list given to you today.  *If you need a refill on your cardiac medications before your next appointment, please call your pharmacy*   Lab Work: None ordered   Testing/Procedures: None ordered   Follow-Up: At [facility], you and your health needs are our priority.  As part of our continuing mission to provide you with exceptional heart care, we have created designated Provider Care Teams.  These Care Teams include your primary Cardiologist (physician) and Advanced Practice Providers (APPs -  Physician Assistants and Nurse Practitioners) who all work together to provide you with the care you need, when you need it.  We recommend signing up for the patient portal called "MyChart".  Sign up information is provided on this After Visit Summary.  MyChart is used to connect with patients for Virtual Visits (Telemedicine).  Patients are able to view lab/test results, encounter notes, upcoming appointments, etc.  Non-urgent messages can be sent to your provider as well.   To learn more about what you can do with MyChart, go to [email or web address].    Your next appointment:   2 month(s)  Provider:   Robin Searing, NP       Other Instructions Check your blood pressure daily for 2 weeks, then contact the office with your readings.  Make sure to check 2 hours after your medications.   AVOID these things for 30 minutes before checking your blood pressure: No Drinking caffeine. No Drinking alcohol. No Eating. No Smoking. No Exercising.  Five minutes before checking your blood pressure: Pee. Sit in a dining chair. Avoid sitting in a soft couch or armchair. Be quiet. Do not talk.

## 2023-01-08 ENCOUNTER — Other Ambulatory Visit: Payer: Self-pay | Admitting: Nurse Practitioner

## 2023-02-18 ENCOUNTER — Ambulatory Visit (INDEPENDENT_AMBULATORY_CARE_PROVIDER_SITE_OTHER)
Admission: RE | Admit: 2023-02-18 | Discharge: 2023-02-18 | Disposition: A | Discharge status: Home / Self Care | Source: Ambulatory Visit | Attending: Nurse Practitioner

## 2023-02-18 ENCOUNTER — Encounter: Payer: Self-pay | Admitting: Nurse Practitioner

## 2023-02-18 ENCOUNTER — Ambulatory Visit: Admitting: Nurse Practitioner

## 2023-02-18 VITALS — BP 152/92 | HR 83 | Temp 98.3°F | Ht 64.0 in | Wt 171.0 lb

## 2023-02-18 DIAGNOSIS — I10 Essential (primary) hypertension: Secondary | ICD-10-CM

## 2023-02-18 DIAGNOSIS — J40 Bronchitis, not specified as acute or chronic: Secondary | ICD-10-CM | POA: Insufficient documentation

## 2023-02-18 DIAGNOSIS — R051 Acute cough: Secondary | ICD-10-CM

## 2023-02-18 DIAGNOSIS — R52 Pain, unspecified: Secondary | ICD-10-CM | POA: Diagnosis not present

## 2023-02-18 LAB — POCT FLU A/B STATUS
Influenza A, POC: NEGATIVE
Influenza B, POC: NEGATIVE

## 2023-02-18 LAB — POC COVID19 BINAXNOW: SARS Coronavirus 2 Ag: NEGATIVE

## 2023-02-18 MED ORDER — AZITHROMYCIN 250 MG PO TABS: ORAL_TABLET | ORAL | 0 refills | Status: AC

## 2023-02-18 MED ORDER — BENZONATATE 200 MG PO CAPS: 200.0000 mg | ORAL_CAPSULE | Freq: Three times a day (TID) | ORAL | 0 refills | Status: DC | PRN

## 2023-02-18 MED ORDER — GUAIFENESIN-CODEINE 100-10 MG/5ML PO SOLN: 5.0000 mL | Freq: Three times a day (TID) | ORAL | 0 refills | Status: AC | PRN

## 2023-02-18 NOTE — Patient Instructions (Signed)
Nice to see you today I have sent in antibiotics, cough pills, and cough syrup. The cough syrup can cause you to be sleepy Follow up if you do not improve Your flu and covid test were negative in office

## 2023-02-18 NOTE — Progress Notes (Signed)
Acute Office Visit  Subjective:     Patient ID: Evan Rivera, male    DOB: November 02, 1960, 62 y.o.   MRN: [id number]  Chief Complaint  Patient presents with   Cough    Pt complains of coughing for 5 days. States he feels a little rattling on left side ribcage. Greenish/brown mucus. Body aches. No fevers or chills.      Patient is in today for sick symptoms with a history of hypertension, GERD, MI, HLD, status post CABG.  Patient has a history of former tobacco abuse   States that he woke up Saturday with a sore throat.  States wife was sick and was treated for an ear infection  Covid vaccine:original series Flu vaccine: not up to date   He has been using  mucinex DM, elderberry, motrin. States that it deos help with the aches and sore throat   Cough is worse with laying down   Review of Systems  Constitutional:  Positive for malaise/fatigue. Negative for chills and fever.  HENT:  Positive for congestion and sore throat. Negative for ear discharge, ear pain and sinus pain.   Respiratory:  Positive for cough, sputum production (green ad brown) and shortness of breath.   Cardiovascular:  Negative for chest pain.  Gastrointestinal:  Negative for abdominal pain, diarrhea, nausea and vomiting.  Musculoskeletal:  Positive for myalgias.  Neurological:  Positive for headaches.        Objective:    BP (!) 152/92   Pulse 83   Temp 98.3 F (36.8 C) (Oral)   Ht 5\' 4"  (1.626 m)   Wt 171 lb (77.6 kg)   SpO2 97%   BMI 29.35 kg/m  BP Readings from Last 3 Encounters:  02/18/23 (!) 152/92  01/02/23 (!) 142/96  06/09/22 120/80   Wt Readings from Last 3 Encounters:  02/18/23 171 lb (77.6 kg)  01/02/23 173 lb 9.6 oz (78.7 kg)  06/09/22 174 lb (78.9 kg)   SpO2 Readings from Last 3 Encounters:  02/18/23 97%  01/02/23 97%  06/09/22 97%      Physical Exam Vitals and nursing note reviewed.  Constitutional:      Appearance: Normal appearance.  HENT:     Right Ear: Tympanic  membrane, ear canal and external ear normal.     Left Ear: Tympanic membrane, ear canal and external ear normal.     Mouth/Throat:     Mouth: Mucous membranes are moist.     Pharynx: Posterior oropharyngeal erythema present.  Cardiovascular:     Rate and Rhythm: Normal rate and regular rhythm.     Heart sounds: Normal heart sounds.  Pulmonary:     Effort: Pulmonary effort is normal.     Breath sounds: Rhonchi (LLL) and rales (LLL) present.  Lymphadenopathy:     Cervical: Cervical adenopathy present.  Neurological:     Mental Status: He is alert.     No results found for any visits on 02/18/23.      Assessment & Plan:   Problem List Items Addressed This Visit       Cardiovascular and Mediastinum   Benign hypertension   Blood pressure elevated at first check and on recheck.  Recently saw cardiology.  Patient is following with them in order to reduce blood pressure.  Currently on lisinopril 5 mg daily.  Avoid decongestants over-the-counter        Respiratory   Bronchitis   Pending cxr. Zpak as directed. Follow up if no improvement  Relevant Medications   azithromycin (ZITHROMAX) 250 MG tablet     Other   Acute cough   Covid and flu swab in office. Tessalon perales and codiene cough medication at night. Sedation precautions reviewed       Relevant Medications   benzonatate (TESSALON) 200 MG capsule   guaiFENesin-codeine 100-10 MG/5ML syrup   Other Relevant Orders   POC COVID-19   POCT Flu A & B Status   DG Chest 2 View   Body aches - Primary   Flu and COVID test in office.  Patient can continue using over the over-the-counter analgesics as needed      Relevant Orders   POC COVID-19   POCT Flu A & B Status    Meds ordered this encounter  Medications   azithromycin (ZITHROMAX) 250 MG tablet    Sig: Take 2 tablets on day 1, then 1 tablet daily on days 2 through 5    Dispense:  6 tablet    Refill:  0    Supervising Provider:   Roxy Manns A [[id number]]    benzonatate (TESSALON) 200 MG capsule    Sig: Take 1 capsule (200 mg total) by mouth 3 (three) times daily as needed for cough.    Dispense:  21 capsule    Refill:  0    Supervising Provider:   Roxy Manns A [[id number]]   guaiFENesin-codeine 100-10 MG/5ML syrup    Sig: Take 5 mLs by mouth 3 (three) times daily as needed for up to 7 days.    Dispense:  105 mL    Refill:  0    Supervising Provider:   Roxy Manns A [[id number]]    Return if symptoms worsen or fail to improve.  Audria Nine, NP

## 2023-02-18 NOTE — Assessment & Plan Note (Signed)
Pending cxr. Zpak as directed. Follow up if no improvement

## 2023-02-18 NOTE — Assessment & Plan Note (Signed)
Blood pressure elevated at first check and on recheck.  Recently saw cardiology.  Patient is following with them in order to reduce blood pressure.  Currently on lisinopril 5 mg daily.  Avoid decongestants over-the-counter

## 2023-02-18 NOTE — Assessment & Plan Note (Signed)
Flu and COVID test in office.  Patient can continue using over the over-the-counter analgesics as needed

## 2023-02-18 NOTE — Assessment & Plan Note (Signed)
Covid and flu swab in office. Tessalon perales and codiene cough medication at night. Sedation precautions reviewed

## 2023-03-01 ENCOUNTER — Telehealth: Admitting: Family

## 2023-03-01 DIAGNOSIS — J069 Acute upper respiratory infection, unspecified: Secondary | ICD-10-CM | POA: Diagnosis not present

## 2023-03-01 MED ORDER — BENZONATATE 100 MG PO CAPS: 100.0000 mg | ORAL_CAPSULE | Freq: Three times a day (TID) | ORAL | 0 refills | Status: DC | PRN

## 2023-03-01 MED ORDER — AMOXICILLIN-POT CLAVULANATE 875-125 MG PO TABS: 1.0000 | ORAL_TABLET | Freq: Two times a day (BID) | ORAL | 0 refills | Status: DC

## 2023-03-01 MED ORDER — FLUTICASONE PROPIONATE 50 MCG/ACT NA SUSP: 2.0000 | Freq: Every day | NASAL | 6 refills | Status: DC

## 2023-03-01 MED ORDER — CETIRIZINE HCL 10 MG PO TABS: 10.0000 mg | ORAL_TABLET | Freq: Every day | ORAL | 1 refills | Status: DC

## 2023-03-01 NOTE — Addendum Note (Signed)
Addended by: Jannifer Rodney A on: 03/01/2023 06:37 PM   Modules accepted: Orders

## 2023-03-01 NOTE — Progress Notes (Signed)

## 2023-03-08 NOTE — Progress Notes (Signed)
 Cardiology Office Note    Evan Rivera Name: Evan Rivera Date of Encounter: 03/10/2023  Primary Care Provider:  Gretta Comer POUR, NP Primary Cardiologist:  Wilbert Bihari, MD Primary Electrophysiologist: None   Past Medical History    Past Medical History:  Diagnosis Date   Allergy    seasonal   Aortic insufficiency    Trivial by echo 12/2021   CAD (coronary artery disease) of bypass graft    s/p CABG x 4   GERD (gastroesophageal reflux disease)    Hyperlipidemia    no meds    Hypertension     History of Present Illness   Evan Rivera  is a 63 year old male with the above mention past medical history who 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  Evan Rivera was initially seen by Dr. Bihari about admitted inpatient for prolonged episode of chest pain and positive troponin.  He had complaint of 10 out of 10 chest pain and troponins elevated to 776 subsequently to 4000.  Evan Rivera ruled in for NSTEMI and had LHC performed by Dr. Jordan that showed severe three-vessel CAD.  He was consulted by CVTS and underwent CABG by Dr. Army on 09/2019.  Evan Rivera was started on Imdur  due to right radial harvest and hospital course was complicated by right-sided pneumothorax that improved and was later discharged. He was last seen by Dr. Bihari on 01/2021.  During visit Evan Rivera's blood pressure is not adequately controlled and his metoprolol  was increased to 50 mg twice daily. He was also referred to HTN clinic for titration.  He was last seen 12/2021 for follow-up and DOT physical completion.  During our visit his blood pressure was well-controlled and Evan Rivera noted increased fatigue and dizziness with metoprolol .  His metoprolol  was discontinued and lisinopril  5 mg was continued.  Evan Rivera presents today for 75-month follow-up.  Reports doing well since previous follow-up with no new cardiac complaints.  Elevated BP and home readings show consistent diastolic in the 90s.  Despite self-adjusting his  lisinopril  dosage, the Evan Rivera's blood pressure remains above goal. The Evan Rivera reports no significant side effects from the lisinopril , but notes that an increase to 10mg  did not result in any noticeable improvement in blood pressure control. Blood pressure today was 140/90 initially and was 132/82 on recheck. he reports that his health has declined since his open heart procedure which he attributes primarily to weight gain. The Evan Rivera has been making efforts to manage his weight and improve his diet, primarily by eating fruits and limiting salt intake. Despite these efforts, the Evan Rivera's weight remains above his target range.Evan Rivera denies chest pain, palpitations, dyspnea, PND, orthopnea, nausea, vomiting, dizziness, syncope, edema, weight gain, or early satiety.   Review of Systems  Please see the history of present illness.    All other systems reviewed and are otherwise negative except as noted above.  Physical Exam    Wt Readings from Last 3 Encounters:  03/10/23 169 lb 6.4 oz (76.8 kg)  02/18/23 171 lb (77.6 kg)  01/02/23 173 lb 9.6 oz (78.7 kg)   VS: Vitals:   03/10/23 0819  BP: (!) 140/90  Pulse: 100  SpO2: 96%  ,Body mass index is 29.08 kg/m. GEN: Well nourished, well developed in no acute distress Neck: No JVD; No carotid bruits Pulmonary: Clear to auscultation without rales, wheezing or rhonchi  Cardiovascular: Normal rate. Regular rhythm. Normal S1. Normal S2.   Murmurs: There is no murmur.  ABDOMEN: Soft, non-tender, non-distended EXTREMITIES:  No  edema; No deformity   EKG/LABS/ Recent Cardiac Studies   ECG personally reviewed by me today -none completed today  Risk Assessment/Calculations:          Lab Results  Component Value Date   WBC 6.2 06/04/2022   HGB 14.7 06/04/2022   HCT 41.9 06/04/2022   MCV 89.7 06/04/2022   PLT 333.0 06/04/2022   Lab Results  Component Value Date   CREATININE 1.02 06/04/2022   BUN 14 06/04/2022   NA 138 06/04/2022   K  4.6 06/04/2022   CL 102 06/04/2022   CO2 28 06/04/2022   Lab Results  Component Value Date   CHOL 108 08/05/2022   HDL 49 08/05/2022   LDLCALC 41 08/05/2022   LDLDIRECT 105.0 06/04/2022   TRIG 94 08/05/2022   CHOLHDL 2.2 08/05/2022    Lab Results  Component Value Date   HGBA1C 5.6 09/26/2019   Assessment & Plan     1.  Coronary artery disease -s/p CABG x4 in 2021.  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 -Today Evan Rivera reports no chest pain or anginal equivalent -Continue GDMT with ASA 81 mg, Praluent and Zetia  -Evan Rivera reports no chest pain or angina since previous follow-up. Continue GDMT with ASA 81 mg, Repatha  140 mg q. 14 days ezetimibe  10 mg daily -Evan Rivera was advised to increase physical activity to at least 150 minutes/week.   2.  Essential hypertension: Evan Rivera's blood pressure initially was elevated at 140/90 and was 132/82 on recheck. -We will discontinue lisinopril  5 mg today -Evan Rivera will start Hyzaar 50/12.5 mg today -We will check BMET in 2 weeks -Evan Rivera advised to continue to monitor blood pressures for the next 2 weeks and bring cuff to follow-up in 1 month.  3.  Hyperlipidemia -History of statin intolerance -Evan Rivera's LDL cholesterol was 37 Continue Zetia  10 mg and Repatha    4.  Overweight: -Evan Rivera's BMI is 29.29 kg/m -Evan Rivera will work on increasing physical activity to at least 150 minutes/week.        Disposition: Follow-up with Wilbert Bihari, MD or APP in 1 months    Signed, Wyn Raddle, Jackee Shove, NP 03/10/2023, 8:51 AM [facility]

## 2023-03-10 ENCOUNTER — Encounter: Payer: Self-pay | Admitting: Nurse Practitioner

## 2023-03-10 ENCOUNTER — Ambulatory Visit: Attending: Nurse Practitioner | Admitting: Nurse Practitioner

## 2023-03-10 ENCOUNTER — Other Ambulatory Visit: Payer: Self-pay | Admitting: *Deleted

## 2023-03-10 VITALS — BP 132/82 | HR 100 | Ht 64.0 in | Wt 169.4 lb

## 2023-03-10 DIAGNOSIS — E663 Overweight: Secondary | ICD-10-CM

## 2023-03-10 DIAGNOSIS — E7800 Pure hypercholesterolemia, unspecified: Secondary | ICD-10-CM

## 2023-03-10 DIAGNOSIS — I25700 Atherosclerosis of coronary artery bypass graft(s), unspecified, with unstable angina pectoris: Secondary | ICD-10-CM | POA: Diagnosis not present

## 2023-03-10 DIAGNOSIS — I10 Essential (primary) hypertension: Secondary | ICD-10-CM

## 2023-03-10 MED ORDER — LOSARTAN POTASSIUM-HCTZ 50-12.5 MG PO TABS: 1.0000 | ORAL_TABLET | Freq: Every day | ORAL | 6 refills | Status: DC

## 2023-03-10 NOTE — Patient Instructions (Signed)
 Medication Instructions:   DISCONTINUE Lisinopril   START Hyzzar one (1) tablet by mouth ( 50-12.5 mg) daily.   *If you need a refill on your cardiac medications before your next appointment, please call your pharmacy*   Lab Work:  Your physician recommends that you return for lab work in: 2 weeks at any [facility]. No fasting.    If you have labs (blood work) drawn today and your tests are completely normal, you will receive your results only by: MyChart Message (if you have MyChart) OR A paper copy in the mail If you have any lab test that is abnormal or we need to change your treatment, we will call you to review the results.   Testing/Procedures:  None ordered.   Follow-Up: At [facility], you and your health needs are our priority.  As part of our continuing mission to provide you with exceptional heart care, we have created designated Provider Care Teams.  These Care Teams include your primary Cardiologist (physician) and Advanced Practice Providers (APPs -  Physician Assistants and Nurse Practitioners) who all work together to provide you with the care you need, when you need it.  We recommend signing up for the patient portal called MyChart.  Sign up information is provided on this After Visit Summary.  MyChart is used to connect with patients for Virtual Visits (Telemedicine).  Patients are able to view lab/test results, encounter notes, upcoming appointments, etc.  Non-urgent messages can be sent to your provider as well.   To learn more about what you can do with MyChart, go to [email or web address].    Your next appointment:   1 month(s)  Provider:   Jackee Alberts, NP         Other Instructions   Please keep monitoring your BP and Heart Rate.

## 2023-03-28 ENCOUNTER — Other Ambulatory Visit: Payer: Self-pay | Admitting: Nurse Practitioner

## 2023-04-04 LAB — BASIC METABOLIC PANEL
BUN/Creatinine Ratio: 11 (ref 10–24)
BUN: 13 mg/dL (ref 8–27)
CO2: 24 mmol/L (ref 20–29)
Calcium: 9.6 mg/dL (ref 8.6–10.2)
Chloride: 101 mmol/L (ref 96–106)
Creatinine, Ser: 1.15 mg/dL (ref 0.76–1.27)
Glucose: 115 mg/dL — ABNORMAL HIGH (ref 70–99)
Potassium: 4.3 mmol/L (ref 3.5–5.2)
Sodium: 139 mmol/L (ref 134–144)
eGFR: 72 mL/min/{1.73_m2} (ref >59)

## 2023-04-09 NOTE — Progress Notes (Signed)
 Cardiology Office Note    Patient Name: Evan Rivera Date of Encounter: 04/09/2023  Primary Care Provider:  Gretta Comer POUR, NP Primary Cardiologist:  Wilbert Bihari, MD Primary Electrophysiologist: None   Past Medical History    Past Medical History:  Diagnosis Date   Allergy    seasonal   Aortic insufficiency    Trivial by echo 12/2021   CAD (coronary artery disease) of bypass graft    s/p CABG x 4   GERD (gastroesophageal reflux disease)    Hyperlipidemia    no meds    Hypertension     History of Present Illness  Evan Rivera is a 63 y.o. male with PMH of CAD s/p CABG x4 09/2019 with radial artery harvest, HLD, HTN, remote tobacco abuse who presents today for 1 month follow-up.  [name] was last seen on 03/10/2023 for follow-up visit.  During visit his blood pressures were elevated and lisinopril  was discontinued and patient was started on Hyzaar 50/12.5 mg.  He endorsed no chest pain cholesterol was well-controlled.  Mr. More presents today for 1 month follow-up.  During his visit today patient's blood pressure was elevated initially 138/92 on recheck at 118/84.  He brings in logs of his blood pressures that show BP in the high 80s and mid 90s diastolically.  He reports increasing his physical activity and is currently working out doing cardio 20 minutes daily.  He is also eating better and following a low-sodium heart healthy diet.  He reports compliance with his current medications and denies any adverse reactions.  Patient denies chest pain, palpitations, dyspnea, PND, orthopnea, nausea, vomiting, dizziness, syncope, edema, weight gain, or early satiety.  Review of Systems  Please see the history of present illness.    All other systems reviewed and are otherwise negative except as noted above.  Physical Exam    Wt Readings from Last 3 Encounters:  03/10/23 169 lb 6.4 oz (76.8 kg)  02/18/23 171 lb (77.6 kg)  01/02/23 173 lb 9.6 oz (78.7 kg)   CD:Uyzmz were  no vitals filed for this visit.,There is no height or weight on file to calculate BMI. GEN: Well nourished, well developed in no acute distress Neck: No JVD; No carotid bruits Pulmonary: Clear to auscultation without rales, wheezing or rhonchi  Cardiovascular: Normal rate. Regular rhythm. Normal S1. Normal S2.   Murmurs: There is no murmur.  ABDOMEN: Soft, non-tender, non-distended EXTREMITIES:  No edema; No deformity   EKG/LABS/ Recent Cardiac Studies   ECG personally reviewed by me today -none completed today  Risk Assessment/Calculations:          Lab Results  Component Value Date   WBC 6.2 06/04/2022   HGB 14.7 06/04/2022   HCT 41.9 06/04/2022   MCV 89.7 06/04/2022   PLT 333.0 06/04/2022   Lab Results  Component Value Date   CREATININE 1.15 04/03/2023   BUN 13 04/03/2023   NA 139 04/03/2023   K 4.3 04/03/2023   CL 101 04/03/2023   CO2 24 04/03/2023   Lab Results  Component Value Date   CHOL 108 08/05/2022   HDL 49 08/05/2022   LDLCALC 41 08/05/2022   LDLDIRECT 105.0 06/04/2022   TRIG 94 08/05/2022   CHOLHDL 2.2 08/05/2022    Lab Results  Component Value Date   HGBA1C 5.6 09/26/2019   Assessment & Plan    1.  Coronary artery disease -s/p CABG x4 in 2021.  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 -Today patient reports  chest pain or angina since previous follow-up. -Continue GDMT with ASA 81 mg, Repatha  140 mg q. 14 days ezetimibe  10 mg daily -Patient was advised to increase physical activity to at least 150 minutes/week.   2.  Essential hypertension: Patient's blood pressure today was initially elevated at 138/92 and is 118/84 on recheck. -Will increase Hyzaar to 100/25 mg daily -Check BMET in 2 weeks -Patient will continue to monitor blood pressures and contact our office with results.  3.  Hyperlipidemia -History of statin intolerance -Patient's LDL cholesterol was 37 Continue Zetia  10 mg and Repatha    4.  Overweight: -Patient's BMI is improved at  28.32 kg/m -Continue current exercise regimen and diet.   Disposition: Follow-up with Wilbert Bihari, MD or APP in 6 months    Signed, Wyn Raddle, Jackee Shove, NP 04/09/2023, 4:01 PM [facility]

## 2023-04-10 ENCOUNTER — Ambulatory Visit: Attending: Nurse Practitioner | Admitting: Nurse Practitioner

## 2023-04-10 ENCOUNTER — Encounter: Payer: Self-pay | Admitting: Nurse Practitioner

## 2023-04-10 VITALS — BP 118/84 | HR 68 | Ht 64.0 in | Wt 165.0 lb

## 2023-04-10 DIAGNOSIS — E663 Overweight: Secondary | ICD-10-CM | POA: Diagnosis not present

## 2023-04-10 DIAGNOSIS — I10 Essential (primary) hypertension: Secondary | ICD-10-CM

## 2023-04-10 DIAGNOSIS — E7800 Pure hypercholesterolemia, unspecified: Secondary | ICD-10-CM

## 2023-04-10 DIAGNOSIS — I25700 Atherosclerosis of coronary artery bypass graft(s), unspecified, with unstable angina pectoris: Secondary | ICD-10-CM

## 2023-04-10 MED ORDER — LOSARTAN POTASSIUM-HCTZ 50-12.5 MG PO TABS: 2.0000 | ORAL_TABLET | Freq: Every day | ORAL | Status: DC

## 2023-04-10 NOTE — Patient Instructions (Addendum)
 Medication Instructions:  Your physician has recommended you make the following change in your medication:   1) INCREASE losartan -hydrochlorothiazide (Hyzaar) to 100-25mg  daily  *If you need a refill on your cardiac medications before your next appointment, please call your pharmacy*  Lab Work: In 2 weeks at [facility] (around April 24, 2023): BMET If you have labs (blood work) drawn today and your tests are completely normal, you will receive your results only by: MyChart Message (if you have MyChart) OR A paper copy in the mail If you have any lab test that is abnormal or we need to change your treatment, we will call you to review the results.  Follow-Up: At [facility], you and your health needs are our priority.  As part of our continuing mission to provide you with exceptional heart care, we have created designated Provider Care Teams.  These Care Teams include your primary Cardiologist (physician) and Advanced Practice Providers (APPs -  Physician Assistants and Nurse Practitioners) who all work together to provide you with the care you need, when you need it.  Your next appointment:   6 month(s)  The format for your next appointment:   In Person  Provider:   Wilbert Bihari, MD or Jackee Alberts, NP  Other Instructions Please continue to check your blood pressure daily at home and send us  a picture or list of your readings via MyChart in 2 weeks.  HOW TO TAKE YOUR BLOOD PRESSURE Rest 5 minutes before taking your blood pressure. Don't  smoke or drink caffeinated beverages for at least 30 minutes before. Take your blood pressure before (not after) you eat. Sit comfortably with your back supported and both feet on the floor ( don't cross your legs). Elevate your arm to heart level on a table or a desk. Use the proper sized cuff.  It should fit smoothly and snugly around your bare upper arm. There should be enough room to slip a fingertip under the cuff.  The bottom edge of the  cuff should be 1 inch above the crease of the elbow.  Please monitor your blood pressure once daily 2 hours after your am medication. If you blood pressure consistently remains above 140 (systolic) top number or over 90 ( diastolic) bottom number X 3 days consecutively.  Please call our office at [phone] or send Mychart message.        1st Floor: - Lobby - Registration  - Pharmacy  - Lab - Cafe  2nd Floor: - PV Lab - Diagnostic Testing (echo, CT, nuclear med)  3rd Floor: - Vacant  4th Floor: - TCTS (cardiothoracic surgery) - AFib Clinic - Structural Heart Clinic - Vascular Surgery  - Vascular Ultrasound  5th Floor: - HeartCare Cardiology (general and EP) - Clinical Pharmacy for coumadin, hypertension, lipid, weight-loss medications, and med management appointments    Valet parking services will be available as well.

## 2023-05-25 ENCOUNTER — Other Ambulatory Visit: Payer: Self-pay

## 2023-05-25 MED ORDER — LOSARTAN POTASSIUM-HCTZ 100-25 MG PO TABS: 1.0000 | ORAL_TABLET | Freq: Every day | ORAL | 1 refills | Status: DC

## 2023-06-04 ENCOUNTER — Ambulatory Visit: Admitting: Family Medicine

## 2023-06-04 ENCOUNTER — Encounter: Payer: Self-pay | Admitting: Family Medicine

## 2023-06-04 ENCOUNTER — Other Ambulatory Visit: Payer: Self-pay

## 2023-06-04 ENCOUNTER — Ambulatory Visit: Payer: Self-pay

## 2023-06-04 VITALS — BP 126/86 | HR 89 | Temp 98.0°F | Ht 64.0 in | Wt 166.0 lb

## 2023-06-04 DIAGNOSIS — N410 Acute prostatitis: Secondary | ICD-10-CM | POA: Diagnosis not present

## 2023-06-04 DIAGNOSIS — K219 Gastro-esophageal reflux disease without esophagitis: Secondary | ICD-10-CM

## 2023-06-04 DIAGNOSIS — Z125 Encounter for screening for malignant neoplasm of prostate: Secondary | ICD-10-CM | POA: Diagnosis not present

## 2023-06-04 MED ORDER — OMEPRAZOLE 20 MG PO CPDR: 20.0000 mg | DELAYED_RELEASE_CAPSULE | Freq: Every day | ORAL | 0 refills | Status: DC

## 2023-06-04 MED ORDER — CIPROFLOXACIN HCL 500 MG PO TABS: 500.0000 mg | ORAL_TABLET | Freq: Two times a day (BID) | ORAL | 0 refills | Status: AC

## 2023-06-04 NOTE — Telephone Encounter (Signed)
  Chief Complaint: testicle/rectum  pain Symptoms: dull ache   Disposition: [] ED /[] Urgent Care (no appt availability in office) / [x] Appointment(In office/virtual)/ []  [facility]/ [] Home Care/ [] Refused Recommended Disposition /[] [facility]/ []  Follow-up with PCP Additional Notes: Pt complaining of rectal/testicle pain. Pt stated the "pain is to the right of the anus and behind testicle." Pt denies any injury or swelling.  Pt's father and brother both had prostate cancer and pt wants to get this checked out. Pt noticed the pain starting several weeks ago. PT is a truck driver and sits lot, but stated when it does hurt it hurts worse while standing. Pt rated pain 1/10. Pt has app today at 1520. RN gave care advice and pt verbalized understanding.           Copied from CRM [id number]. Topic: Clinical - Red Word Triage >> Jun 04, 2023  9:10 AM Gurney Maxin H wrote: [facility] that prompted transfer to Nurse Triage: Pain in rectum, kind to the right side between testicles and butt Reason for Disposition  [1] Brief pain in scrotum or testicle AND [2] present < 1 hour AND [3] recurrent  (NO swelling)  Answer Assessment - Initial Assessment Questions 1. LOCATION and RADIATION: "Where is the pain located?"      Right of rectum, behind testicles  2. QUALITY: "What does the pain feel like?"  (e.g., sharp, dull, aching, burning)     Dull  3. SEVERITY: "How bad is the pain?"  (Scale 1-10; or mild, moderate, severe)   - MILD (1-3): doesn't interfere with normal activities    - MODERATE (4-7): interferes with normal activities (e.g., work or school) or awakens from sleep   - SEVERE (8-10): excruciating pain, unable to do any normal activities, difficulty walking     1 4. ONSET: "When did the pain start?"     Couple of weeks 5. PATTERN: "Does it come and go, or has it been constant since it started?"     Comes and goes  6. SCROTAL APPEARANCE: "What does the scrotum look like?" "Is  there any swelling or redness?"      Denies  8. OTHER SYMPTOMS: "Do you have any other symptoms?" (e.g., abdomen pain, difficulty passing urine, fever, vomiting)     Denies all  Protocols used: Scrotum Pain-A-AH

## 2023-06-04 NOTE — Progress Notes (Signed)
 Evan Rivera T. Jericka Kadar, MD, CAQ Sports Medicine [facility] [address] [place], [address]  Phone: [phone]  FAX: [phone]  Evan Rivera - 63 y.o. male  MRN [id number]  Date of Birth: Aug 23, 1960  Date: 06/04/2023  PCP: Evan Nest, NP  Referral: Evan Nest, NP  Chief Complaint  Patient presents with   Prostate Pain   Subjective:   Evan Rivera is a 63 y.o. very pleasant male patient with Body mass index is 28.49 kg/m. who presents with the following:  He is a very nice patient, and he presents with some ongoing discomfort and a deep dull ache in the perineal region.  This is inferior and slightly behind the testicles but anterior to the anus.  No trauma or injury.  No fever.  Multiple family members with a history of prostate cancer.  Last few days No urination pain No urgency No STD risk No blood No ejac pain    Review of Systems is noted in the HPI, as appropriate  Objective:   BP 126/86 (BP Location: Right Arm, Patient Position: Sitting, Cuff Size: Large)   Pulse 89   Temp 98 F (36.7 C) (Temporal)   Ht 5\' 4"  (1.626 m)   Wt 166 lb (75.3 kg)   SpO2 96%   BMI 28.49 kg/m   GEN: No acute distress; alert,appropriate. PULM: Breathing comfortably in no respiratory distress PSYCH: Normally interactive.   Laboratory and Imaging Data:  Assessment and Plan:     ICD-10-CM   1. Acute prostatitis  N41.0 Urine Culture    ciprofloxacin (CIPRO) 500 MG tablet    Urinalysis, Routine w reflex microscopic    2. Screening for prostate cancer  Z12.5 PSA, Total with Reflex to PSA, Free     Thank you sounds like fairly classic prostatitis.  Will treat this as such.  With family history, check PSA.  PSA can be falsely elevated in the case of prostatitis, so if this is elevated I would repeat it.  Medication Management during today's office visit: Meds ordered this encounter  Medications    ciprofloxacin (CIPRO) 500 MG tablet    Sig: Take 1 tablet (500 mg total) by mouth 2 (two) times daily for 14 days.    Dispense:  28 tablet    Refill:  0   There are no discontinued medications.  Orders placed today for conditions managed today: Orders Placed This Encounter  Procedures   Urine Culture   PSA, Total with Reflex to PSA, Free   Urinalysis, Routine w reflex microscopic    Disposition: No follow-ups on file.  Dr[employer] One speech-to-text software was used for transcription in this dictation.  Possible transcriptional errors can occur using Animal nutritionist.   Signed,  Evan Galea. Raschelle Wisenbaker, MD   Outpatient Encounter Medications as of 06/04/2023  Medication Sig   acetaminophen (TYLENOL) 500 MG tablet Take 2 tablets (1,000 mg total) by mouth every 6 (six) hours as needed for mild pain, fever or headache.   albuterol (VENTOLIN HFA) 108 (90 Base) MCG/ACT inhaler Inhale into the lungs as needed for wheezing or shortness of breath.   aspirin 81 MG chewable tablet Chew 1 tablet (81 mg total) by mouth daily.   ciprofloxacin (CIPRO) 500 MG tablet Take 1 tablet (500 mg total) by mouth 2 (two) times daily for 14 days.   ezetimibe (ZETIA) 10 MG tablet TAKE 1 TABLET(10 MG) BY MOUTH AT BEDTIME   fluticasone (  FLONASE) 50 MCG/ACT nasal spray Place 2 sprays into both nostrils daily.   losartan-hydrochlorothiazide (HYZAAR) 100-25 MG tablet Take 1 tablet by mouth daily.   omeprazole (PRILOSEC) 20 MG capsule Take 1 capsule (20 mg total) by mouth daily. for heartburn.   REPATHA SURECLICK 140 MG/ML SOAJ INJECT 140 MG UNDER THE SKIN EVERY 14 DAYS   No facility-administered encounter medications on file as of 06/04/2023.

## 2023-06-04 NOTE — Telephone Encounter (Signed)
 Patient is due for CPE/follow up, this will be required prior to any further refills.  Please schedule, thank you!

## 2023-06-05 LAB — URINALYSIS, ROUTINE W REFLEX MICROSCOPIC
Bilirubin Urine: NEGATIVE
Ketones, ur: NEGATIVE
Leukocytes,Ua: NEGATIVE
Nitrite: NEGATIVE
Specific Gravity, Urine: 1.025 (ref 1.000–1.030)
Total Protein, Urine: NEGATIVE
Urine Glucose: NEGATIVE
Urobilinogen, UA: 0.2 (ref 0.0–1.0)
WBC, UA: NONE SEEN (ref 0–?)
pH: 6 (ref 5.0–8.0)

## 2023-06-05 NOTE — Telephone Encounter (Signed)
 lvm for pt to call office to schedule appt.

## 2023-06-06 ENCOUNTER — Encounter: Payer: Self-pay | Admitting: Family Medicine

## 2023-06-08 ENCOUNTER — Telehealth: Payer: Self-pay | Admitting: Primary Care

## 2023-06-08 LAB — URINE CULTURE
MICRO NUMBER:: 16285294
Result:: NO GROWTH
SPECIMEN QUALITY:: ADEQUATE

## 2023-06-08 LAB — PSA, TOTAL WITH REFLEX TO PSA, FREE: PSA, Total: 0.5 ng/mL (ref <=4.0)

## 2023-06-08 NOTE — Telephone Encounter (Signed)
 Patient scheduled.

## 2023-06-08 NOTE — Telephone Encounter (Signed)
 Copied from CRM [id number]. Topic: Clinical - Prescription Issue >> Jun 08, 2023  8:38 AM Carlatta H wrote: Reason for CRM: Patient was seen on 4/3 by Dr Dallas Schimke and would like to know does he still need to come in for his prescription refill//Please all patient and advise

## 2023-06-08 NOTE — Telephone Encounter (Signed)
 LVM for patient to c/b and schedule.

## 2023-06-08 NOTE — Telephone Encounter (Signed)
 Per chart review Evan Rivera last message does request CPE for more refills. Please reach out to patient to set up.

## 2023-06-30 ENCOUNTER — Encounter: Payer: Self-pay | Admitting: Primary Care

## 2023-06-30 ENCOUNTER — Other Ambulatory Visit: Payer: Self-pay | Admitting: Cardiology

## 2023-06-30 ENCOUNTER — Ambulatory Visit (INDEPENDENT_AMBULATORY_CARE_PROVIDER_SITE_OTHER): Admitting: Primary Care

## 2023-06-30 VITALS — BP 136/80 | HR 73 | Temp 98.0°F | Ht 64.0 in | Wt 168.0 lb

## 2023-06-30 DIAGNOSIS — G43009 Migraine without aura, not intractable, without status migrainosus: Secondary | ICD-10-CM | POA: Diagnosis not present

## 2023-06-30 DIAGNOSIS — I252 Old myocardial infarction: Secondary | ICD-10-CM

## 2023-06-30 DIAGNOSIS — K219 Gastro-esophageal reflux disease without esophagitis: Secondary | ICD-10-CM

## 2023-06-30 DIAGNOSIS — G43909 Migraine, unspecified, not intractable, without status migrainosus: Secondary | ICD-10-CM | POA: Insufficient documentation

## 2023-06-30 DIAGNOSIS — M25511 Pain in right shoulder: Secondary | ICD-10-CM | POA: Diagnosis not present

## 2023-06-30 DIAGNOSIS — Z0000 Encounter for general adult medical examination without abnormal findings: Secondary | ICD-10-CM

## 2023-06-30 DIAGNOSIS — E785 Hyperlipidemia, unspecified: Secondary | ICD-10-CM

## 2023-06-30 DIAGNOSIS — E7800 Pure hypercholesterolemia, unspecified: Secondary | ICD-10-CM

## 2023-06-30 DIAGNOSIS — M25512 Pain in left shoulder: Secondary | ICD-10-CM

## 2023-06-30 DIAGNOSIS — G8929 Other chronic pain: Secondary | ICD-10-CM

## 2023-06-30 DIAGNOSIS — I10 Essential (primary) hypertension: Secondary | ICD-10-CM

## 2023-06-30 MED ORDER — OMEPRAZOLE 20 MG PO CPDR: 20.0000 mg | DELAYED_RELEASE_CAPSULE | Freq: Every day | ORAL | 3 refills | Status: AC

## 2023-06-30 NOTE — Assessment & Plan Note (Signed)
 Asymptomatic.  Following with cardiology. Office notes reviewed from January and February 2025.  Continue aspirin  81 mg, lipid control, BP control.

## 2023-06-30 NOTE — Assessment & Plan Note (Signed)
 Immunizations UTD. Colonoscopy UTD, due 2027 PSA reviewed   Discussed the importance of a healthy diet and regular exercise in order for weight loss, and to reduce the risk of further co-morbidity.  Exam stable. Labs reviewed   Follow up in 1 year for repeat physical.

## 2023-06-30 NOTE — Assessment & Plan Note (Signed)
 Infrequent overall.  Offered Toradol injection today, he kindly declines.  Continue to monitor.

## 2023-06-30 NOTE — Assessment & Plan Note (Signed)
 Following with cardiology. He declines labs today as he will undergo labs in a few months.   Continue Repatha  140 mg every 14 days.  Continue Zetia  10 mg daily.

## 2023-06-30 NOTE — Addendum Note (Signed)
 Addended by: Cheynne Virden K on: 06/30/2023 03:40 PM   Modules accepted: Orders

## 2023-06-30 NOTE — Assessment & Plan Note (Signed)
 Continued.  Following with Dr. Geralyn Knee.

## 2023-06-30 NOTE — Progress Notes (Addendum)
 Subjective:    Patient ID: Jamison Mccreedy, male    DOB: 07-27-1960, 63 y.o.   MRN: [id number]  HPI  Tirth Mask Seagren is a very pleasant 63 y.o. male who presents today for complete physical and follow up of chronic conditions.  He would also like to discuss migraine. Chronic and prior history that dates back to childhood. His migraine is located to the left parietal lobe with radiation to left occipital lobe. He also experienced photophobia, phonophobia, nausea. Today he continues to notice his headache but this has improved. He's been taking Goody Powders every 4 hours x 2 doses, then 800 mg last night.   Immunizations: -Tetanus: Completed in 2024 -Shingles: Completed Shingrix  series  Diet: Fair diet.  Exercise: No regular exercise.  Eye exam: Completes annually  Dental exam: Completes semi-annually    Colonoscopy: Completed in 2017, due 2027  PSA: Due   BP Readings from Last 3 Encounters:  06/30/23 136/80  06/04/23 126/86  04/10/23 118/84      Review of Systems  Constitutional:  Negative for unexpected weight change.  HENT:  Negative for rhinorrhea.   Respiratory:  Negative for cough and shortness of breath.   Cardiovascular:  Negative for chest pain.  Gastrointestinal:  Negative for constipation and diarrhea.  Genitourinary:  Negative for difficulty urinating.  Musculoskeletal:  Negative for arthralgias and myalgias.  Skin:  Negative for rash.  Allergic/Immunologic: Negative for environmental allergies.  Neurological:  Positive for headaches. Negative for dizziness.  Psychiatric/Behavioral:  The patient is not nervous/anxious.          Past Medical History:  Diagnosis Date   Allergy    seasonal   Aortic insufficiency    Trivial by echo 12/2021   CAD (coronary artery disease) of bypass graft    s/p CABG x 4   GERD (gastroesophageal reflux disease)    Hyperlipidemia    no meds    Hypertension     Social History   Socioeconomic History   Marital  status: Married    Spouse name: Not on file   Number of children: Not on file   Years of education: Not on file   Highest education level: Not on file  Occupational History   Not on file  Tobacco Use   Smoking status: Former    Current packs/day: 0.00    Types: Cigarettes    Quit date: 06/23/1989    Years since quitting: 34.0   Smokeless tobacco: Former  Substance and Sexual Activity   Alcohol use: No   Drug use: No   Sexual activity: Not on file  Other Topics Concern   Not on file  Social History Narrative   Married.   2 children.   Works as a Naval architect.   Enjoys kayaking.    [employer] years.     Social Drivers of Corporate investment banker Strain: Not on file  Food Insecurity: Not on file  Transportation Needs: Not on file  Physical Activity: Not on file  Stress: Not on file  Social Connections: Not on file  Intimate Partner Violence: Not on file    Past Surgical History:  Procedure Laterality Date   COLONOSCOPY     > 10 yeras ago- normal per pt.    CORONARY ARTERY BYPASS GRAFT N/A 09/27/2019   Procedure: CORONARY ARTERY BYPASS GRAFTING (CABG), ON PUMP, TIMES 4, USING LEFT INTERNAL MAMMARY ARTERY, LEFT RADIAL ARTERY, AND ENDOSCOPICALLY HARVESTED RIGHT GREATER SAPHENOUS VEIN. LIMA to LAD,  LEFT RADIAL ARTERY to OM1, SVG to DIAG 1, SVG to PLB;  Surgeon: Norita Beauvais, MD;  Location: [facility] OR;  Service: Open Heart Surgery;  Laterality: N/A;   ENDOVEIN HARVEST OF GREATER SAPHENOUS VEIN Right 09/27/2019   Procedure: ENDOVEIN HARVEST OF GREATER SAPHENOUS VEIN;  Surgeon: Norita Beauvais, MD;  Location: [facility] OR;  Service: Open Heart Surgery;  Laterality: Right;   HERNIA REPAIR     umbilical    LEFT HEART CATH AND CORONARY ANGIOGRAPHY N/A 09/26/2019   Procedure: LEFT HEART CATH AND CORONARY ANGIOGRAPHY;  Surgeon: Swaziland, Peter M, MD;  Location: [facility] INVASIVE CV LAB;  Service: Cardiovascular;  Laterality: N/A;   RADIAL ARTERY HARVEST Left 09/27/2019   Procedure: RADIAL  ARTERY HARVEST;  Surgeon: Norita Beauvais, MD;  Location: [facility] OR;  Service: Open Heart Surgery;  Laterality: Left;   RESECTION OF MEDIASTINAL MASS N/A 09/27/2019   Procedure: RESECTION OF INCIDENTAL ANTERIOR  MEDIASTINAL MASS;  Surgeon: Norita Beauvais, MD;  Location: [facility] OR;  Service: Open Heart Surgery;  Laterality: N/A;   TEE WITHOUT CARDIOVERSION N/A 09/27/2019   Procedure: TRANSESOPHAGEAL ECHOCARDIOGRAM (TEE);  Surgeon: Norita Beauvais, MD;  Location: [facility] OR;  Service: Open Heart Surgery;  Laterality: N/A;   TONSILLECTOMY      Family History  Problem Relation Age of Onset   Heart disease Mother    Prostate cancer Father        late 83's   Hyperlipidemia Father    Hypertension Father    Prostate cancer Brother    Colon cancer Neg Hx    Colon polyps Neg Hx    Esophageal cancer Neg Hx    Rectal cancer Neg Hx    Stomach cancer Neg Hx     Allergies  Allergen Reactions   Statins Anaphylaxis    Pt does not remember which statin caused the reaction   Drug [Tape] Hives    Reaction to some forms of bandaids - please use paper tape    Current Outpatient Medications on File Prior to Visit  Medication Sig Dispense Refill   acetaminophen  (TYLENOL ) 500 MG tablet Take 2 tablets (1,000 mg total) by mouth every 6 (six) hours as needed for mild pain, fever or headache. 30 tablet 0   aspirin  81 MG chewable tablet Chew 1 tablet (81 mg total) by mouth daily.     ezetimibe  (ZETIA ) 10 MG tablet TAKE 1 TABLET(10 MG) BY MOUTH AT BEDTIME 90 tablet 3   losartan -hydrochlorothiazide (HYZAAR) 100-25 MG tablet Take 1 tablet by mouth daily. 90 tablet 1   REPATHA  SURECLICK 140 MG/ML SOAJ INJECT 140 MG UNDER THE SKIN EVERY 14 DAYS 6 mL 3   fluticasone  (FLONASE ) 50 MCG/ACT nasal spray Place 2 sprays into both nostrils daily. (Patient not taking: Reported on 06/30/2023) 16 g 6   No current facility-administered medications on file prior to visit.    BP 136/80   Pulse 73   Temp 98 F (36.7 C) (Oral)    Ht 5\' 4"  (1.626 m)   Wt 168 lb (76.2 kg)   SpO2 97%   BMI 28.84 kg/m  Objective:   Physical Exam HENT:     Right Ear: Tympanic membrane and ear canal normal.     Left Ear: Tympanic membrane and ear canal normal.  Eyes:     Pupils: Pupils are equal, round, and reactive to light.  Cardiovascular:     Rate and Rhythm: Normal rate and regular rhythm.  Pulmonary:  Effort: Pulmonary effort is normal.     Breath sounds: Normal breath sounds.  Abdominal:     General: Bowel sounds are normal.     Palpations: Abdomen is soft.     Tenderness: There is no abdominal tenderness.  Musculoskeletal:        General: Normal range of motion.     Cervical back: Neck supple.  Skin:    General: Skin is warm and dry.  Neurological:     Mental Status: He is alert and oriented to person, place, and time.     Cranial Nerves: No cranial nerve deficit.     Deep Tendon Reflexes:     Reflex Scores:      Patellar reflexes are 2+ on the right side and 2+ on the left side. Psychiatric:        Mood and Affect: Mood normal.           Assessment & Plan:  Preventative health care Assessment & Plan: Immunizations UTD. Colonoscopy UTD, due 2027 PSA reviewed   Discussed the importance of a healthy diet and regular exercise in order for weight loss, and to reduce the risk of further co-morbidity.  Exam stable. Labs reviewed   Follow up in 1 year for repeat physical.    Migraine without aura and without status migrainosus, not intractable Assessment & Plan: Infrequent overall.  Offered Toradol injection today, he kindly declines.  Continue to monitor.    Gastroesophageal reflux disease, unspecified whether esophagitis present Assessment & Plan: Controlled.  Continue omeprazole  20 mg daily.    Chronic pain of both shoulders Assessment & Plan: Continued.  Following with Dr. Geralyn Knee.   History of non-ST elevation myocardial infarction (NSTEMI) Assessment &  Plan: Asymptomatic.  Following with cardiology. Office notes reviewed from January and February 2025.  Continue aspirin  81 mg, lipid control, BP control.    Pure hypercholesterolemia Assessment & Plan: Following with cardiology. He declines labs today as he will undergo labs in a few months.   Continue Repatha  140 mg every 14 days.  Continue Zetia  10 mg daily.      Benign hypertension Assessment & Plan: Initially above goal, improved on recheck.  Continue losartan -hydrochlorothiazide 100-25 mg daily. Reviewed BMP from February 2025.    Gastroesophageal reflux disease without esophagitis Assessment & Plan: Controlled.  Continue omeprazole  20 mg daily.   Orders: -     Omeprazole ; Take 1 capsule (20 mg total) by mouth daily. for heartburn.  Dispense: 90 capsule; Refill: 3        Gabriel John, NP

## 2023-06-30 NOTE — Patient Instructions (Signed)
 It was a pleasure to see you today!

## 2023-06-30 NOTE — Assessment & Plan Note (Signed)
 Initially above goal, improved on recheck.  Continue losartan -hydrochlorothiazide 100-25 mg daily. Reviewed BMP from February 2025.

## 2023-06-30 NOTE — Assessment & Plan Note (Signed)
Controlled.   Continue omeprazole 20 mg daily. 

## 2023-08-19 ENCOUNTER — Other Ambulatory Visit: Payer: Self-pay | Admitting: Nurse Practitioner

## 2023-12-31 ENCOUNTER — Telehealth (HOSPITAL_BASED_OUTPATIENT_CLINIC_OR_DEPARTMENT_OTHER): Payer: Self-pay | Admitting: *Deleted

## 2023-12-31 ENCOUNTER — Telehealth: Payer: Self-pay

## 2023-12-31 NOTE — Telephone Encounter (Signed)
Patient has been scheduled for televisit.

## 2023-12-31 NOTE — Telephone Encounter (Signed)
 Patient has been scheduled for televisit med rec and consent done     Patient Consent for Virtual Visit         Evan Rivera has provided verbal consent on 12/31/2023 for a virtual visit (video or telephone).   CONSENT FOR VIRTUAL VISIT FOR:  Evan Rivera  By participating in this virtual visit I agree to the following:  I hereby voluntarily request, consent and authorize [facility] and its employed or contracted physicians, physician assistants, nurse practitioners or other licensed health care professionals (the Practitioner), to provide me with telemedicine health care services (the "Services) as deemed necessary by the treating Practitioner. I acknowledge and consent to receive the Services by the Practitioner via telemedicine. I understand that the telemedicine visit will involve communicating with the Practitioner through live audiovisual communication technology and the disclosure of certain medical information by electronic transmission. I acknowledge that I have been given the opportunity to request an in-person assessment or other available alternative prior to the telemedicine visit and am voluntarily participating in the telemedicine visit.  I understand that I have the right to withhold or withdraw my consent to the use of telemedicine in the course of my care at any time, without affecting my right to future care or treatment, and that the Practitioner or I may terminate the telemedicine visit at any time. I understand that I have the right to inspect all information obtained and/or recorded in the course of the telemedicine visit and may receive copies of available information for a reasonable fee.  I understand that some of the potential risks of receiving the Services via telemedicine include:  Delay or interruption in medical evaluation due to technological equipment failure or disruption; Information transmitted may not be sufficient (e.g. poor resolution of  images) to allow for appropriate medical decision making by the Practitioner; and/or  In rare instances, security protocols could fail, causing a breach of personal health information.  Furthermore, I acknowledge that it is my responsibility to provide information about my medical history, conditions and care that is complete and accurate to the best of my ability. I acknowledge that Practitioner's advice, recommendations, and/or decision may be based on factors not within their control, such as incomplete or inaccurate data provided by me or distortions of diagnostic images or specimens that may result from electronic transmissions. I understand that the practice of medicine is not an exact science and that Practitioner makes no warranties or guarantees regarding treatment outcomes. I acknowledge that a copy of this consent can be made available to me via my patient portal [facility] MyChart), or I can request a printed copy by calling the office of [facility].    I understand that my insurance will be billed for this visit.   I have read or had this consent read to me. I understand the contents of this consent, which adequately explains the benefits and risks of the Services being provided via telemedicine.  I have been provided ample opportunity to ask questions regarding this consent and the Services and have had my questions answered to my satisfaction. I give my informed consent for the services to be provided through the use of telemedicine in my medical care

## 2023-12-31 NOTE — Telephone Encounter (Signed)
   Pre-operative Risk Assessment    Patient Name: Evan Rivera  DOB: [birth year] MRN: [id number]   Date of last office visit: 04/10/23 JACKEE ALBERTS, NP Date of next office visit: NONE   Request for Surgical Clearance    Procedure:  LEFT SHOULDER ARTHROSCOPIC, RCR  Date of Surgery:  Clearance TBD                                Surgeon:  DR. RANKIN WHITE Surgeon's Group or Practice Name:  [facility] Phone number:  [phone] Fax number:  [phone] LORI STONE   Type of Clearance Requested:   - Medical  - Pharmacy:  Hold Aspirin      Type of Anesthesia:  Not Indicated   Additional requests/questions:    Bonney Niels Jest   12/31/2023, 2:02 PM

## 2023-12-31 NOTE — Telephone Encounter (Signed)
   Name: Evan Rivera  DOB: [birth year]  MRN: [id number]  Primary Cardiologist: Wilbert Bihari, MD   Preoperative team, please contact this patient and set up a phone call appointment for further preoperative risk assessment. Please obtain consent and complete medication review. Thank you for your help.  I confirm that guidance regarding antiplatelet and oral anticoagulation therapy has been completed and, if necessary, noted below.  Regarding ASA therapy, we recommend continuation of ASA throughout the perioperative period. However, if the surgeon feels that cessation of ASA is required in the perioperative period, it may be stopped 5-7 days prior to surgery with a plan to resume it as soon as felt to be feasible from a surgical standpoint in the post-operative period.   I also confirmed the patient resides in the state of [place] . As per [facility] Medical Board telemedicine laws, the patient must reside in the state in which the provider is licensed.   Jailynn Lavalais E Keiji Melland, NP 12/31/2023, 2:55 PM [facility]

## 2024-01-06 ENCOUNTER — Telehealth: Payer: Self-pay

## 2024-01-06 NOTE — Telephone Encounter (Signed)
 Received faxed surgical clearance form from Katheryn Dustman, of [facility], [place]. Pt needs L- ARCR, bicep tenodesis. Surgery date pending clearance.   Spoke with pt to schedule pre-op exam OV. Pt requests OV asap. Per Mallie, she can see pt 01/08/24. Offered visit 01/08/24 at 2:00, pt accepts. Will have pt added to schedule.   [Placed form in [place] 'In Box'.]

## 2024-01-08 ENCOUNTER — Ambulatory Visit: Admitting: Primary Care

## 2024-01-08 ENCOUNTER — Encounter: Payer: Self-pay | Admitting: Primary Care

## 2024-01-08 ENCOUNTER — Telehealth: Payer: Self-pay

## 2024-01-08 VITALS — BP 136/88 | HR 85 | Temp 98.1°F | Ht 65.0 in | Wt 164.0 lb

## 2024-01-08 DIAGNOSIS — Z01818 Encounter for other preprocedural examination: Secondary | ICD-10-CM | POA: Diagnosis not present

## 2024-01-08 DIAGNOSIS — T7840XA Allergy, unspecified, initial encounter: Secondary | ICD-10-CM

## 2024-01-08 DIAGNOSIS — E7800 Pure hypercholesterolemia, unspecified: Secondary | ICD-10-CM | POA: Diagnosis not present

## 2024-01-08 DIAGNOSIS — G8929 Other chronic pain: Secondary | ICD-10-CM

## 2024-01-08 DIAGNOSIS — M25512 Pain in left shoulder: Secondary | ICD-10-CM

## 2024-01-08 DIAGNOSIS — I10 Essential (primary) hypertension: Secondary | ICD-10-CM | POA: Diagnosis not present

## 2024-01-08 DIAGNOSIS — R21 Rash and other nonspecific skin eruption: Secondary | ICD-10-CM

## 2024-01-08 MED ORDER — TRIAMCINOLONE ACETONIDE 0.1 % EX CREA: 1.0000 | TOPICAL_CREAM | Freq: Two times a day (BID) | CUTANEOUS | 0 refills | Status: AC

## 2024-01-08 MED ORDER — HYDROXYZINE HCL 10 MG PO TABS: 10.0000 mg | ORAL_TABLET | Freq: Three times a day (TID) | ORAL | 0 refills | Status: AC | PRN

## 2024-01-08 NOTE — Telephone Encounter (Signed)
 Called patient left message to call office. Provider has meeting and needs to move his appointment to 3:40 today. Called both patient and wife on dpr. Sending my chart as well.

## 2024-01-08 NOTE — Patient Instructions (Signed)
 Apply the triamcinolone  cream twice daily for itching.  You may take hydroxyzine every 8 hours as needed for itching.  This may cause drowsiness.  Stop by the lab prior to leaving today. I will notify you of your results once received.   It was a pleasure to see you today!

## 2024-01-08 NOTE — Assessment & Plan Note (Signed)
 Stable  Continue losartan -hydrochlorothiazide 100-25 mg daily.  BMP pending.

## 2024-01-08 NOTE — Assessment & Plan Note (Signed)
 Pending left rotator cuff surgery repair for December 2025  Labs ordered and pending. Cardiology to also clear.  Should be cleared by a medical standpoint as long as rash improves and labs look good.  Await results.

## 2024-01-08 NOTE — Progress Notes (Signed)
 Subjective:    Patient ID: Evan Rivera, male    DOB: 03/03/1961, 63 y.o.   MRN: [id number]  Evan Rivera is a very pleasant 63 y.o. male with a history of aortic insufficiency, hypertension, migraines, GERD, CAD with CABG, NSTEMI who presents today for preoperative clearance  He is pending a left rotator cuff repair per Dr. Teresa. He has an appointment for cardiology clearance on 01/19/24.   About 8 days ago he received a steroid injection to the right shoulder. Four days ago he began noticing an itchy, red rash to the site. The site is itchy and red. He notified his orthopedic doctor today who did not have recommendations. He denies wheezing, shortness of breath, chest tightness.   BP Readings from Last 3 Encounters:  01/08/24 136/88  06/30/23 136/80  06/04/23 126/86      Review of Systems  Constitutional:  Negative for unexpected weight change.  HENT:  Negative for rhinorrhea.   Respiratory:  Negative for cough, shortness of breath and wheezing.   Cardiovascular:  Negative for chest pain.  Gastrointestinal:  Negative for constipation and diarrhea.  Genitourinary:  Negative for difficulty urinating.  Musculoskeletal:  Positive for arthralgias.  Skin:  Positive for rash.  Allergic/Immunologic: Negative for environmental allergies.  Neurological:  Negative for dizziness and headaches.         Past Medical History:  Diagnosis Date   Allergy    seasonal   Aortic insufficiency    Trivial by echo 12/2021   CAD (coronary artery disease) of bypass graft    s/p CABG x 4   GERD (gastroesophageal reflux disease)    Hyperlipidemia    no meds    Hypertension     Social History   Socioeconomic History   Marital status: Married    Spouse name: Not on file   Number of children: Not on file   Years of education: Not on file   Highest education level: Not on file  Occupational History   Not on file  Tobacco Use   Smoking status: Former    Current packs/day: 0.00     Types: Cigarettes    Quit date: 06/23/1989    Years since quitting: 34.5   Smokeless tobacco: Former  Substance and Sexual Activity   Alcohol use: No   Drug use: No   Sexual activity: Not on file  Other Topics Concern   Not on file  Social History Narrative   Married.   2 children.   Works as a naval architect.   Enjoys kayaking.    [employer] years.     Social Drivers of Corporate Investment Banker Strain: Not on file  Food Insecurity: Not on file  Transportation Needs: Not on file  Physical Activity: Not on file  Stress: Not on file  Social Connections: Not on file  Intimate Partner Violence: Not on file    Past Surgical History:  Procedure Laterality Date   COLONOSCOPY     > 10 yeras ago- normal per pt.    CORONARY ARTERY BYPASS GRAFT N/A 09/27/2019   Procedure: CORONARY ARTERY BYPASS GRAFTING (CABG), ON PUMP, TIMES 4, USING LEFT INTERNAL MAMMARY ARTERY, LEFT RADIAL ARTERY, AND ENDOSCOPICALLY HARVESTED RIGHT GREATER SAPHENOUS VEIN. LIMA to LAD, LEFT RADIAL ARTERY to OM1, SVG to DIAG 1, SVG to PLB;  Surgeon: Army Dallas NOVAK, MD;  Location: [facility] OR;  Service: Open Heart Surgery;  Laterality: N/A;   ENDOVEIN HARVEST OF GREATER SAPHENOUS VEIN Right  09/27/2019   Procedure: ENDOVEIN HARVEST OF GREATER SAPHENOUS VEIN;  Surgeon: Army Dallas NOVAK, MD;  Location: [facility] OR;  Service: Open Heart Surgery;  Laterality: Right;   HERNIA REPAIR     umbilical    LEFT HEART CATH AND CORONARY ANGIOGRAPHY N/A 09/26/2019   Procedure: LEFT HEART CATH AND CORONARY ANGIOGRAPHY;  Surgeon: Jordan, Peter M, MD;  Location: [facility] INVASIVE CV LAB;  Service: Cardiovascular;  Laterality: N/A;   RADIAL ARTERY HARVEST Left 09/27/2019   Procedure: RADIAL ARTERY HARVEST;  Surgeon: Army Dallas NOVAK, MD;  Location: [facility] OR;  Service: Open Heart Surgery;  Laterality: Left;   RESECTION OF MEDIASTINAL MASS N/A 09/27/2019   Procedure: RESECTION OF INCIDENTAL ANTERIOR  MEDIASTINAL MASS;  Surgeon: Army Dallas NOVAK, MD;   Location: [facility] OR;  Service: Open Heart Surgery;  Laterality: N/A;   TEE WITHOUT CARDIOVERSION N/A 09/27/2019   Procedure: TRANSESOPHAGEAL ECHOCARDIOGRAM (TEE);  Surgeon: Army Dallas NOVAK, MD;  Location: [facility] OR;  Service: Open Heart Surgery;  Laterality: N/A;   TONSILLECTOMY      Family History  Problem Relation Age of Onset   Heart disease Mother    Prostate cancer Father        late 80's   Hyperlipidemia Father    Hypertension Father    Prostate cancer Brother    Colon cancer Neg Hx    Colon polyps Neg Hx    Esophageal cancer Neg Hx    Rectal cancer Neg Hx    Stomach cancer Neg Hx     Allergies  Allergen Reactions   Statins Anaphylaxis    Pt does not remember which statin caused the reaction   Drug [Tape] Hives    Reaction to some forms of bandaids - please use paper tape   Ivp Dye [Iodinated Contrast Media] Hives    Current Outpatient Medications on File Prior to Visit  Medication Sig Dispense Refill   acetaminophen  (TYLENOL ) 500 MG tablet Take 2 tablets (1,000 mg total) by mouth every 6 (six) hours as needed for mild pain, fever or headache. 30 tablet 0   aspirin  81 MG chewable tablet Chew 1 tablet (81 mg total) by mouth daily.     ezetimibe  (ZETIA ) 10 MG tablet TAKE 1 TABLET(10 MG) BY MOUTH AT BEDTIME 90 tablet 3   losartan -hydrochlorothiazide (HYZAAR) 100-25 MG tablet TAKE ONE TABLET BY MOUTH ONCE A DAY 90 tablet 1   omeprazole  (PRILOSEC) 20 MG capsule Take 1 capsule (20 mg total) by mouth daily. for heartburn. 90 capsule 3   REPATHA  SURECLICK 140 MG/ML SOAJ INJECT 140 MG UNDER THE SKIN EVERY 14 DAYS 6 mL 3   No current facility-administered medications on file prior to visit.    BP 136/88 (BP Location: Left Arm, Patient Position: Sitting, Cuff Size: Normal)   Pulse 85   Temp 98.1 F (36.7 C) (Temporal)   Ht 5' 5 (1.651 m)   Wt 164 lb (74.4 kg)   SpO2 99%   BMI 27.29 kg/m  Objective:   Physical Exam HENT:     Right Ear: Tympanic membrane and ear canal  normal.     Left Ear: Tympanic membrane and ear canal normal.  Eyes:     Pupils: Pupils are equal, round, and reactive to light.  Cardiovascular:     Rate and Rhythm: Normal rate and regular rhythm.  Pulmonary:     Effort: Pulmonary effort is normal.     Breath sounds: Normal breath sounds.  Abdominal:     General:  Bowel sounds are normal.     Palpations: Abdomen is soft.     Tenderness: There is no abdominal tenderness.  Musculoskeletal:        General: Normal range of motion.     Cervical back: Neck supple.  Skin:    General: Skin is warm and dry.     Findings: Rash present.     Comments: Localized hive like rash to right anterior shoulder and upper right chest.  Neurological:     Mental Status: He is alert and oriented to person, place, and time.     Cranial Nerves: No cranial nerve deficit.     Deep Tendon Reflexes:     Reflex Scores:      Patellar reflexes are 2+ on the right side and 2+ on the left side. Psychiatric:        Mood and Affect: Mood normal.     Physical Exam        Assessment & Plan:  Preoperative clearance Assessment & Plan: Pending left rotator cuff surgery repair for December 2025  Labs ordered and pending. Cardiology to also clear.  Should be cleared by a medical standpoint as long as rash improves and labs look good.  Await results.   Orders: -     CBC with Differential/Platelet -     Basic metabolic panel with GFR -     Hemoglobin A1c  Pure hypercholesterolemia -     CBC with Differential/Platelet -     Basic metabolic panel with GFR -     Hemoglobin A1c  Benign hypertension Assessment & Plan: Stable  Continue losartan -hydrochlorothiazide 100-25 mg daily.  BMP pending.  Orders: -     CBC with Differential/Platelet -     Basic metabolic panel with GFR -     Hemoglobin A1c  Rash due to allergy -     Triamcinolone  Acetonide; Apply 1 Application topically 2 (two) times daily.  Dispense: 30 g; Refill: 0 -     hydrOXYzine  HCl; Take 1-2 tablets (10-20 mg total) by mouth 3 (three) times daily as needed for itching.  Dispense: 15 tablet; Refill: 0  Chronic left shoulder pain Assessment & Plan: Pending rotator cuff surgery repair in December 2025.  Exam performed today. Aside from localized allergic reaction, exam unremarkable.  Checking labs today. He will also be cleared by cardiology. Surgical clearance forms to be faxed once labs return.     Assessment and Plan Assessment & Plan         Comer MARLA Gaskins, NP     History of Present Illness

## 2024-01-08 NOTE — Telephone Encounter (Signed)
 Patient has returned call. Moved as requested no further action needed.

## 2024-01-08 NOTE — Telephone Encounter (Signed)
 Looks like OV moved to 3:40 today.

## 2024-01-08 NOTE — Assessment & Plan Note (Signed)
 Pending rotator cuff surgery repair in December 2025.  Exam performed today. Aside from localized allergic reaction, exam unremarkable.  Checking labs today. He will also be cleared by cardiology. Surgical clearance forms to be faxed once labs return.

## 2024-01-09 LAB — CBC WITH DIFFERENTIAL/PLATELET
Absolute Lymphocytes: 1674 {cells}/uL (ref 850–3900)
Absolute Monocytes: 792 {cells}/uL (ref 200–950)
Basophils Absolute: 91 {cells}/uL (ref 0–200)
Basophils Relative: 1 %
Eosinophils Absolute: 537 {cells}/uL — ABNORMAL HIGH (ref 15–500)
Eosinophils Relative: 5.9 %
HCT: 45.4 % (ref 38.5–50.0)
Hemoglobin: 15.4 g/dL (ref 13.2–17.1)
MCH: 30.3 pg (ref 27.0–33.0)
MCHC: 33.9 g/dL (ref 32.0–36.0)
MCV: 89.2 fL (ref 80.0–100.0)
MPV: 9.8 fL (ref 7.5–12.5)
Monocytes Relative: 8.7 %
Neutro Abs: 6006 {cells}/uL (ref 1500–7800)
Neutrophils Relative %: 66 %
Platelets: 409 Thousand/uL — ABNORMAL HIGH (ref 140–400)
RBC: 5.09 Million/uL (ref 4.20–5.80)
RDW: 12.6 % (ref 11.0–15.0)
Total Lymphocyte: 18.4 %
WBC: 9.1 Thousand/uL (ref 3.8–10.8)

## 2024-01-09 LAB — BASIC METABOLIC PANEL WITH GFR
BUN: 13 mg/dL (ref 7–25)
CO2: 29 mmol/L (ref 20–32)
Calcium: 10 mg/dL (ref 8.6–10.3)
Chloride: 98 mmol/L (ref 98–110)
Creat: 0.81 mg/dL (ref 0.70–1.35)
Glucose, Bld: 93 mg/dL (ref 65–99)
Potassium: 4.7 mmol/L (ref 3.5–5.3)
Sodium: 136 mmol/L (ref 135–146)
eGFR: 98 mL/min/1.73m2 (ref >=60)

## 2024-01-09 LAB — HEMOGLOBIN A1C
Hgb A1c MFr Bld: 5.7 % — ABNORMAL HIGH (ref <5.7)
Mean Plasma Glucose: 117 mg/dL
eAG (mmol/L): 6.5 mmol/L

## 2024-01-12 ENCOUNTER — Ambulatory Visit: Payer: Self-pay | Admitting: Primary Care

## 2024-01-12 NOTE — Telephone Encounter (Signed)
 Form and labs faxed to [facility].

## 2024-01-18 NOTE — Progress Notes (Unsigned)
 Virtual Visit via Telephone Note   Because of Evan Rivera co-morbid illnesses, he is at least at moderate risk for complications without adequate follow up.  This format is felt to be most appropriate for this patient at this time.  Due to technical limitations with video connection (technology), today's appointment will be conducted as an audio only telehealth visit, and Evan Rivera verbally agreed to proceed in this manner.   All issues noted in this document were discussed and addressed.  No physical exam could be performed with this format.  Evaluation Performed:  Preoperative cardiovascular risk assessment _____________   Date:  01/18/2024   Patient ID:  Evan Rivera, DOB [birth year], MRN [id number] Patient Location:  Home Provider location:   Office  Primary Care Provider:  Gretta Comer POUR, Rivera Primary Cardiologist:  Evan Bihari, Rivera  Chief Complaint / Patient Profile   63 y.o. y/o male with a h/o HTN, GERD, coronary artery disease, CABG x 4 who is pending left shoulder arthroscopy, RCR and presents today for telephonic preoperative cardiovascular risk assessment.  History of Present Illness    Evan Rivera is a 63 y.o. male who presents via audio/video conferencing for a telehealth visit today.  Pt was last seen in cardiology clinic on 04/10/2023 by Evan Alberts, Rivera.  At that time Evan Rivera was doing well .  The patient is now pending procedure as outlined above. Since his last visit, he continues to be stable from a cardiac standpoint.  Today he denies chest pain, shortness of breath, lower extremity edema, fatigue, palpitations, melena, hematuria, hemoptysis, diaphoresis, weakness, presyncope, syncope, orthopnea, and PND.   Past Medical History    Past Medical History:  Diagnosis Date   Allergy    seasonal   Aortic insufficiency    Trivial by echo 12/2021   CAD (coronary artery disease) of bypass graft    s/p CABG x 4   GERD (gastroesophageal  reflux disease)    Hyperlipidemia    no meds    Hypertension    Past Surgical History:  Procedure Laterality Date   COLONOSCOPY     > 10 yeras ago- normal per pt.    CORONARY ARTERY BYPASS GRAFT N/A 09/27/2019   Procedure: CORONARY ARTERY BYPASS GRAFTING (CABG), ON PUMP, TIMES 4, USING LEFT INTERNAL MAMMARY ARTERY, LEFT RADIAL ARTERY, AND ENDOSCOPICALLY HARVESTED RIGHT GREATER SAPHENOUS VEIN. LIMA to LAD, LEFT RADIAL ARTERY to OM1, SVG to DIAG 1, SVG to PLB;  Surgeon: Evan Rivera;  Location: [facility] OR;  Service: Open Heart Surgery;  Laterality: N/A;   ENDOVEIN HARVEST OF GREATER SAPHENOUS VEIN Right 09/27/2019   Procedure: ENDOVEIN HARVEST OF GREATER SAPHENOUS VEIN;  Surgeon: Evan Rivera;  Location: [facility] OR;  Service: Open Heart Surgery;  Laterality: Right;   HERNIA REPAIR     umbilical    LEFT HEART CATH AND CORONARY ANGIOGRAPHY N/A 09/26/2019   Procedure: LEFT HEART CATH AND CORONARY ANGIOGRAPHY;  Surgeon: Evan Rivera;  Location: [facility] INVASIVE CV LAB;  Service: Cardiovascular;  Laterality: N/A;   RADIAL ARTERY HARVEST Left 09/27/2019   Procedure: RADIAL ARTERY HARVEST;  Surgeon: Evan Rivera;  Location: [facility] OR;  Service: Open Heart Surgery;  Laterality: Left;   RESECTION OF MEDIASTINAL MASS N/A 09/27/2019   Procedure: RESECTION OF INCIDENTAL ANTERIOR  MEDIASTINAL MASS;  Surgeon: Evan Rivera;  Location: [facility] OR;  Service: Open Heart Surgery;  Laterality: N/A;   TEE WITHOUT CARDIOVERSION  N/A 09/27/2019   Procedure: TRANSESOPHAGEAL ECHOCARDIOGRAM (TEE);  Surgeon: Evan Rivera;  Location: [facility] OR;  Service: Open Heart Surgery;  Laterality: N/A;   TONSILLECTOMY      Allergies  Allergies  Allergen Reactions   Statins Anaphylaxis    Pt does not remember which statin caused the reaction   Drug [Tape] Hives    Reaction to some forms of bandaids - please use paper tape   Ivp Dye [Iodinated Contrast Media] Hives    Home Medications    Prior to  Admission medications   Medication Sig Start Date End Date Taking? Authorizing Provider  acetaminophen  (TYLENOL ) 500 MG tablet Take 2 tablets (1,000 mg total) by mouth every 6 (six) hours as needed for mild pain, fever or headache. 10/01/19   Evan Rivera  aspirin  81 MG chewable tablet Chew 1 tablet (81 mg total) by mouth daily. 10/01/19   Evan Rivera  ezetimibe  (ZETIA ) 10 MG tablet TAKE 1 TABLET(10 MG) BY MOUTH AT BEDTIME 03/30/23   Evan Rivera  hydrOXYzine (ATARAX) 10 MG tablet Take 1-2 tablets (10-20 mg total) by mouth 3 (three) times daily as needed for itching. 01/08/24   Evan Rivera  losartan -hydrochlorothiazide (HYZAAR) 100-25 MG tablet TAKE ONE TABLET BY MOUTH ONCE A DAY 08/20/23   Evan Rivera  omeprazole  (PRILOSEC) 20 MG capsule Take 1 capsule (20 mg total) by mouth daily. for heartburn. 06/30/23   Evan Rivera  REPATHA  SURECLICK 140 MG/ML SOAJ INJECT 140 MG UNDER THE SKIN EVERY 14 DAYS 07/01/23   Evan Rivera  triamcinolone  cream (KENALOG ) 0.1 % Apply 1 Application topically 2 (two) times daily. 01/08/24   Evan Rivera    Physical Exam    Vital Signs:  Evan Rivera does not have vital signs available for review today.  Given telephonic nature of communication, physical exam is limited. AAOx3. NAD. Normal affect.  Speech and respirations are unlabored.  Accessory Clinical Findings    None  Assessment & Plan    1.  Preoperative Cardiovascular Risk Assessment: LEFT SHOULDER ARTHROSCOPIC, RCR   Date of Surgery:  Clearance TBD                                  Surgeon:  Evan Rivera Surgeon's Group or Practice Name:  Evan Rivera Phone number:  [phone] Fax number:  [phone]       Primary Cardiologist: Evan Shlomo, Rivera  Chart reviewed as part of pre-operative protocol coverage. Given past medical history and time since last visit, based on ACC/AHA guidelines, Evan Rivera would be at  acceptable risk for the planned procedure without further cardiovascular testing.   His RCRI is low risk, 1.1% risk of major cardiac event.  He is able to complete greater than 4 METS of physical activity.  Patient was advised that if he develops new symptoms prior to surgery to contact our office to arrange a follow-up appointment.  He verbalized understanding.  Regarding ASA therapy, we recommend continuation of ASA throughout the perioperative period. However, if the surgeon feels that cessation of ASA is required in the perioperative period, it may be stopped 5-7 days prior to surgery with a plan to resume it as soon as felt to be feasible from a surgical standpoint in the post-operative period.   I will route this recommendation to  the requesting party via Epic fax function and remove from pre-op pool.       Time:   Today, I have spent  5 minutes with the patient with telehealth technology discussing medical history, symptoms, and management plan.  I spent 10 minutes reviewing patient's past cardiac history and cardiac medications.    Josefa CHRISTELLA Beauvais, Rivera  01/18/2024, 3:42 PM

## 2024-01-19 ENCOUNTER — Ambulatory Visit: Attending: Internal Medicine

## 2024-01-19 ENCOUNTER — Telehealth: Payer: Self-pay | Admitting: Cardiology

## 2024-01-19 DIAGNOSIS — Z01810 Encounter for preprocedural cardiovascular examination: Secondary | ICD-10-CM | POA: Diagnosis not present

## 2024-01-19 DIAGNOSIS — I2510 Atherosclerotic heart disease of native coronary artery without angina pectoris: Secondary | ICD-10-CM

## 2024-01-19 DIAGNOSIS — Z01818 Encounter for other preprocedural examination: Secondary | ICD-10-CM

## 2024-01-19 NOTE — Telephone Encounter (Signed)
 Emelia Josefa HERO, NP to [facility] Triage   01/19/24 12:35 PM Please order echocardiogram (coronary artery disease, status post CABG) and schedule patient for office visit.  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  Thank you for your help.   Josefa HERO. Cleaver NP-C      01/19/2024, 12:34 PM [facility] [address] [place], [place] [address] Office [phone]

## 2024-01-19 NOTE — Telephone Encounter (Signed)
 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  He stated he is needing this before 12/1.      [phone]

## 2024-01-19 NOTE — Telephone Encounter (Signed)
 Left message for patient that Josefa NP has ordered echo and someone will call to arrange test and follow up. Routed message to scheduler(s)

## 2024-02-11 ENCOUNTER — Ambulatory Visit (HOSPITAL_COMMUNITY)
Admission: RE | Admit: 2024-02-11 | Discharge: 2024-02-11 | Disposition: A | Discharge status: Home / Self Care | Source: Ambulatory Visit | Attending: General Practice | Admitting: General Practice

## 2024-02-11 DIAGNOSIS — I2510 Atherosclerotic heart disease of native coronary artery without angina pectoris: Secondary | ICD-10-CM

## 2024-02-12 LAB — ECHOCARDIOGRAM COMPLETE
Area-P 1/2: 3.51 cm2
Calc EF: 63.1 %
S' Lateral: 2.7 cm
Single Plane A2C EF: 61.7 %
Single Plane A4C EF: 63.8 %

## 2024-02-15 ENCOUNTER — Ambulatory Visit: Payer: Self-pay | Admitting: General Practice

## 2024-02-15 NOTE — Progress Notes (Unsigned)
 Cardiology Office Note:  .   Date:  02/18/2024  ID:  Evan Rivera, DOB May 12, 1960, MRN [id number] PCP: Gretta Comer POUR, NP  [facility] Providers Cardiologist:  Wilbert Bihari, MD {  History of Present Illness: .   Evan Rivera is a 63 y.o. male with history of CAD status post CABG x 4 09/2019, hypertension, hyperlipidemia, remote tobacco abuse     CAD 09/2019 NSTEMI.  Status post CABG x 4 with radial artery harvest.  LIMA to LAD, SVG to D1, left radial artery to OM, SVG to PLB of RCA.  EF preserved.  Social history  Former smoker.  Quit over 20+ years ago. No alcohol or drugs. Moderately active, goes to the gym 2-3 times per week. Truck driver, former hotel manager      Patient with history of CABG x 4 in 2021.  He has done well over the years without need for any further ischemic evaluation.  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  Last echo 02/2024.  Last seen 04/2023 and managing blood pressure with no acute complaints.  Today patient presents for annual follow-up.  He continues to do very well with no limitations in exercise capacity whatsoever and with no complaints of chest pain.  He reports several years ago his primary symptom was numbness in his throat and jaw but never really had any chest pain.  He is working out 2-3 times a week at [employer] on the elliptical for around 30 minutes at a time with no exertional symptoms.  Feeling well overall.  Blood pressure and heart rate slightly elevated today but GPS sent him to the wrong address and he physically ran to the clinic so he was not late.  ROS: Denies: Chest pain, shortness of breath, orthopnea, peripheral edema, palpitations, decreased exercise intolerance, fatigue, lightheadedness.   Studies Reviewed: SABRA    EKG Interpretation Date/Time:  Thursday February 18 2024 08:07:40 EST Ventricular Rate:  107 PR Interval:  132 QRS Duration:  86 QT  Interval:  312 QTC Calculation: 416 R Axis:   57  Text Interpretation: Sinus tachycardia Possible Inferior infarct , age undetermined When compared with ECG of 02-Jan-2023 09:13, No significant change was found Confirmed by Darryle Currier [phone]) on 02/18/2024 8:10:36 AM    Risk Assessment/Calculations:             Physical Exam:   VS:  BP 132/88   Pulse (!) 107   Ht 5' 5 (1.651 m)   Wt 166 lb 6.4 oz (75.5 kg)   SpO2 98%   BMI 27.69 kg/m    Wt Readings from Last 3 Encounters:  02/18/24 166 lb 6.4 oz (75.5 kg)  01/08/24 164 lb (74.4 kg)  06/30/23 168 lb (76.2 kg)    GEN: Well nourished, well developed in no acute distress NECK: No JVD; No carotid bruits CARDIAC: RRR, no murmurs, rubs, gallops RESPIRATORY:  Clear to auscultation without rales, wheezing or rhonchi  ABDOMEN: Soft, non-tender, non-distended EXTREMITIES:  No edema; No deformity   ASSESSMENT AND PLAN: .    CAD Hyperlipidemia - CABG x 4 09/2019 Stable disease with no anginal complaints or symptoms that would prompt further evaluation at this time.  Excellent exercise capacity. Continue with aspirin , Repatha , Zetia . Overdue for lipid panel but well-controlled previously.  Obtain fasting lipid panel today.  Goal less than 55.  Hypertension Blood pressure slightly elevated but improved on recheck 132/88.  He physically ran  over here prior to his appointment but otherwise has well-controlled blood pressure.  Continue Hyzaar 100-25 mg daily.  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  02/2024 EF 60 to 65% with mild concentric LVH.  Normal RV.  No significant valve disease.  Paperwork signed, he is approaching 5-year mark in which they have required ETT stress test and annual echocardiograms.  We have ordered this for next year and arranged follow-up to be performed within a couple weeks after.   Dispo: 1 year follow-up with Dr. Shlomo.  Signed, Thom LITTIE Sluder, PA-C

## 2024-02-16 ENCOUNTER — Other Ambulatory Visit: Payer: Self-pay | Admitting: Cardiology

## 2024-02-18 ENCOUNTER — Ambulatory Visit: Attending: Cardiology | Admitting: Cardiology

## 2024-02-18 ENCOUNTER — Encounter: Payer: Self-pay | Admitting: Cardiology

## 2024-02-18 VITALS — BP 132/88 | HR 107 | Ht 65.0 in | Wt 166.4 lb

## 2024-02-18 DIAGNOSIS — E785 Hyperlipidemia, unspecified: Secondary | ICD-10-CM

## 2024-02-18 DIAGNOSIS — Z951 Presence of aortocoronary bypass graft: Secondary | ICD-10-CM | POA: Diagnosis not present

## 2024-02-18 DIAGNOSIS — I2510 Atherosclerotic heart disease of native coronary artery without angina pectoris: Secondary | ICD-10-CM

## 2024-02-18 DIAGNOSIS — I10 Essential (primary) hypertension: Secondary | ICD-10-CM | POA: Diagnosis not present

## 2024-02-18 DIAGNOSIS — I25700 Atherosclerosis of coronary artery bypass graft(s), unspecified, with unstable angina pectoris: Secondary | ICD-10-CM

## 2024-02-18 LAB — LIPID PANEL
Chol/HDL Ratio: 3 ratio (ref 0.0–5.0)
Cholesterol, Total: 151 mg/dL (ref 100–199)
HDL: 50 mg/dL (ref >39)
LDL Chol Calc (NIH): 60 mg/dL (ref 0–99)
Triglycerides: 261 mg/dL — ABNORMAL HIGH (ref 0–149)
VLDL Cholesterol Cal: 41 mg/dL — ABNORMAL HIGH (ref 5–40)

## 2024-02-18 NOTE — Patient Instructions (Signed)
 Medication Instructions:  Your physician recommends that you continue on your current medications as directed. Please refer to the Current Medication list given to you today.  *If you need a refill on your cardiac medications before your next appointment, please call your pharmacy*  Lab Work: TODAY:  LIPID  If you have labs (blood work) drawn today and your tests are completely normal, you will receive your results only by: MyChart Message (if you have MyChart) OR A paper copy in the mail If you have any lab test that is abnormal or we need to change your treatment, we will call you to review the results.  Testing/Procedures: Your physician has requested that you have an echocardiogram. Echocardiography is a painless test that uses sound waves to create images of your heart. It provides your doctor with information about the size and shape of your heart and how well your hearts chambers and valves are working. This procedure takes approximately one hour. There are no restrictions for this procedure. Please do NOT wear cologne, perfume, aftershave, or lotions (deodorant is allowed). Please arrive 15 minutes prior to your appointment time.  Please note: We ask at that you not bring children with you during ultrasound (echo/ vascular) testing. Due to room size and safety concerns, children are not allowed in the ultrasound rooms during exams. Our front office staff cannot provide observation of children in our lobby area while testing is being conducted. An adult accompanying a patient to their appointment will only be allowed in the ultrasound room at the discretion of the ultrasound technician under special circumstances. We apologize for any inconvenience.   Your physician has requested that you have an exercise tolerance test. For further information please visit [email or web address] Please also follow instruction sheet,    - DO NOT EAT, DRINK, OR USE TOBACCO PRODUCTS WITHIN 4 HOURS OF THE  TEST - DRESS PREPARED TO EXERCISE IN A COMFORTABLE, 2 PIECE CLOTHING OUTFIT AND WALKING SHOES - BRING ANY PRESCRIPTION MEDICATIONS WITH YOU  - NOTIFY THE OFFICE 24 HOURS IN ADVANCE IF YOU NEED TO CANCEL - CALL THE OFFICE [phone] IF YOU HAVE ANY QUESTIONS    Follow-Up: At [facility], you and your health needs are our priority.  As part of our continuing mission to provide you with exceptional heart care, our providers are all part of one team.  This team includes your primary Cardiologist (physician) and Advanced Practice Providers or APPs (Physician Assistants and Nurse Practitioners) who all work together to provide you with the care you need, when you need it.  Your next appointment:   12 month(s)  Provider:   Wilbert Bihari, MD or Thom Sluder, PA-C          We recommend signing up for the patient portal called MyChart.  Sign up information is provided on this After Visit Summary.  MyChart is used to connect with patients for Virtual Visits (Telemedicine).  Patients are able to view lab/test results, encounter notes, upcoming appointments, etc.  Non-urgent messages can be sent to your provider as well.   To learn more about what you can do with MyChart, go to [email or web address].   Other Instructions

## 2024-02-19 ENCOUNTER — Ambulatory Visit: Payer: Self-pay | Admitting: Cardiology

## 2024-02-19 ENCOUNTER — Telehealth: Payer: Self-pay | Admitting: Cardiology

## 2024-02-19 NOTE — Telephone Encounter (Signed)
 Attempted to return surgeon's office called and received no answer. [facility] informing of recommended ASA hold, also faxed tele preop clearance

## 2024-02-19 NOTE — Telephone Encounter (Signed)
 Caller Evan Rivera) is following-up on when patient should stop his Aspirin  and noted patient has surgery scheduled for Monday. 12/22.
